# Patient Record
Sex: Male | Born: 1945 | Race: Black or African American | Hispanic: No | Marital: Single | Smoking: Former smoker
Health system: Southern US, Community
[De-identification: ages and names within clinical notes are randomized; demographics above are authoritative.]

## PROBLEM LIST (undated history)

## (undated) DIAGNOSIS — K219 Gastro-esophageal reflux disease without esophagitis: Secondary | ICD-10-CM

## (undated) DIAGNOSIS — R7303 Prediabetes: Secondary | ICD-10-CM

## (undated) DIAGNOSIS — C801 Malignant (primary) neoplasm, unspecified: Secondary | ICD-10-CM

## (undated) HISTORY — PX: CERVICAL FUSION: SHX112

---

## 2006-07-10 ENCOUNTER — Ambulatory Visit: Payer: Self-pay

## 2008-07-19 ENCOUNTER — Emergency Department: Payer: Self-pay | Admitting: Emergency Medicine

## 2008-12-17 ENCOUNTER — Emergency Department: Payer: Self-pay | Admitting: Emergency Medicine

## 2009-05-11 ENCOUNTER — Observation Stay: Payer: Self-pay | Admitting: Specialist

## 2009-05-16 ENCOUNTER — Emergency Department: Payer: Self-pay | Admitting: Emergency Medicine

## 2009-05-26 ENCOUNTER — Ambulatory Visit: Payer: Self-pay | Admitting: Family Medicine

## 2009-05-28 ENCOUNTER — Ambulatory Visit: Payer: Self-pay | Admitting: Internal Medicine

## 2010-07-22 ENCOUNTER — Emergency Department: Payer: Self-pay | Admitting: Emergency Medicine

## 2010-12-05 ENCOUNTER — Emergency Department: Payer: Self-pay | Admitting: Emergency Medicine

## 2011-03-25 ENCOUNTER — Emergency Department: Payer: Self-pay | Admitting: Emergency Medicine

## 2012-01-07 ENCOUNTER — Emergency Department: Payer: Self-pay | Admitting: Emergency Medicine

## 2012-04-16 ENCOUNTER — Emergency Department: Payer: Self-pay | Admitting: Unknown Physician Specialty

## 2012-04-16 LAB — CBC
HCT: 34.5 % — ABNORMAL LOW (ref 40.0–52.0)
MCHC: 33.8 g/dL (ref 32.0–36.0)
Platelet: 210 10*3/uL (ref 150–440)
RBC: 3.74 10*6/uL — ABNORMAL LOW (ref 4.40–5.90)
RDW: 12.7 % (ref 11.5–14.5)

## 2012-04-16 LAB — COMPREHENSIVE METABOLIC PANEL WITH GFR
Albumin: 3.6 g/dL
Alkaline Phosphatase: 75 U/L
Anion Gap: 6 — ABNORMAL LOW
BUN: 13 mg/dL
Bilirubin,Total: 0.3 mg/dL
Calcium, Total: 8.7 mg/dL
Chloride: 110 mmol/L — ABNORMAL HIGH
Co2: 28 mmol/L
Creatinine: 0.82 mg/dL
EGFR (African American): >60
EGFR (Non-African Amer.): >60
Glucose: 76 mg/dL
Osmolality: 286
Potassium: 3.3 mmol/L — ABNORMAL LOW
SGOT(AST): 33 U/L
SGPT (ALT): 39 U/L
Sodium: 144 mmol/L
Total Protein: 7.5 g/dL

## 2012-04-16 LAB — CK TOTAL AND CKMB (NOT AT ARMC)
CK, Total: 469 U/L — ABNORMAL HIGH
CK-MB: 10.7 ng/mL — ABNORMAL HIGH

## 2012-04-16 LAB — TROPONIN I: Troponin-I: <0.02 ng/mL

## 2012-04-17 LAB — CK TOTAL AND CKMB (NOT AT ARMC)
CK, Total: 387 U/L — ABNORMAL HIGH (ref 35–232)
CK-MB: 8.2 ng/mL — ABNORMAL HIGH (ref 0.5–3.6)

## 2012-04-17 LAB — MAGNESIUM: Magnesium: 2 mg/dL

## 2012-04-17 LAB — TROPONIN I: Troponin-I: <0.02 ng/mL

## 2012-04-17 LAB — PRO B NATRIURETIC PEPTIDE: B-Type Natriuretic Peptide: 240 pg/mL — ABNORMAL HIGH (ref 0–125)

## 2013-04-22 ENCOUNTER — Emergency Department: Payer: Self-pay | Admitting: Emergency Medicine

## 2013-04-22 LAB — BASIC METABOLIC PANEL
Anion Gap: 4 — ABNORMAL LOW (ref 7–16)
BUN: 9 mg/dL (ref 7–18)
CHLORIDE: 106 mmol/L (ref 98–107)
Calcium, Total: 9.2 mg/dL (ref 8.5–10.1)
Co2: 29 mmol/L (ref 21–32)
Creatinine: 0.87 mg/dL (ref 0.60–1.30)
EGFR (African American): >60
EGFR (Non-African Amer.): >60
Glucose: 75 mg/dL (ref 65–99)
OSMOLALITY: 275 (ref 275–301)
Potassium: 3.8 mmol/L (ref 3.5–5.1)
Sodium: 139 mmol/L (ref 136–145)

## 2013-04-22 LAB — CBC
HCT: 36.9 % — ABNORMAL LOW (ref 40.0–52.0)
HGB: 12.5 g/dL — AB (ref 13.0–18.0)
MCH: 31.6 pg (ref 26.0–34.0)
MCHC: 34 g/dL (ref 32.0–36.0)
MCV: 93 fL (ref 80–100)
Platelet: 178 10*3/uL (ref 150–440)
RBC: 3.97 10*6/uL — ABNORMAL LOW (ref 4.40–5.90)
RDW: 13.1 % (ref 11.5–14.5)
WBC: 7.5 10*3/uL (ref 3.8–10.6)

## 2013-04-22 LAB — TROPONIN I
Troponin-I: <0.02 ng/mL
Troponin-I: <0.02 ng/mL

## 2013-08-29 ENCOUNTER — Emergency Department: Payer: Self-pay | Admitting: Emergency Medicine

## 2013-08-29 LAB — BASIC METABOLIC PANEL
Anion Gap: 5 — ABNORMAL LOW (ref 7–16)
BUN: 10 mg/dL (ref 7–18)
CALCIUM: 8.7 mg/dL (ref 8.5–10.1)
CO2: 25 mmol/L (ref 21–32)
Chloride: 109 mmol/L — ABNORMAL HIGH (ref 98–107)
Creatinine: 0.94 mg/dL (ref 0.60–1.30)
EGFR (Non-African Amer.): >60
GLUCOSE: 93 mg/dL (ref 65–99)
Osmolality: 276 (ref 275–301)
Potassium: 4 mmol/L (ref 3.5–5.1)
SODIUM: 139 mmol/L (ref 136–145)

## 2013-08-29 LAB — URIC ACID: URIC ACID: 3.7 mg/dL (ref 3.5–7.2)

## 2014-04-28 ENCOUNTER — Emergency Department: Payer: Self-pay | Admitting: Emergency Medicine

## 2014-04-28 LAB — URINALYSIS, COMPLETE
BILIRUBIN, UR: NEGATIVE
Bacteria: NONE SEEN
Blood: NEGATIVE
GLUCOSE, UR: NEGATIVE mg/dL (ref 0–75)
Ketone: NEGATIVE
Leukocyte Esterase: NEGATIVE
Nitrite: NEGATIVE
Ph: 6 (ref 4.5–8.0)
Protein: NEGATIVE
Specific Gravity: 1.006 (ref 1.003–1.030)
WBC UR: <1 /HPF (ref 0–5)

## 2015-11-27 ENCOUNTER — Emergency Department
Admission: EM | Admit: 2015-11-27 | Discharge: 2015-11-27 | Disposition: A | Discharge status: Home / Self Care | Payer: Medicare Other | Attending: Emergency Medicine | Admitting: Emergency Medicine

## 2015-11-27 DIAGNOSIS — T63441A Toxic effect of venom of bees, accidental (unintentional), initial encounter: Secondary | ICD-10-CM | POA: Diagnosis not present

## 2015-11-27 DIAGNOSIS — Z79899 Other long term (current) drug therapy: Secondary | ICD-10-CM | POA: Diagnosis not present

## 2015-11-27 DIAGNOSIS — Z87891 Personal history of nicotine dependence: Secondary | ICD-10-CM | POA: Diagnosis not present

## 2015-11-27 HISTORY — DX: Gastro-esophageal reflux disease without esophagitis: K21.9

## 2015-11-27 HISTORY — DX: Prediabetes: R73.03

## 2015-11-27 MED ORDER — DIPHENHYDRAMINE HCL 25 MG PO CAPS
25.0000 mg | ORAL_CAPSULE | Freq: Once | ORAL | Status: AC
  Administered 2015-11-27: 25 mg via ORAL
  Filled 2015-11-27: qty 1

## 2015-11-27 MED ORDER — IBUPROFEN 800 MG PO TABS
800.0000 mg | ORAL_TABLET | Freq: Once | ORAL | Status: AC
  Administered 2015-11-27: 800 mg via ORAL
  Filled 2015-11-27: qty 1

## 2015-11-27 MED ORDER — IBUPROFEN 600 MG PO TABS: 600.0000 mg | ORAL_TABLET | Freq: Four times a day (QID) | ORAL | 0 refills | Status: DC | PRN

## 2015-11-27 MED ORDER — DIPHENHYDRAMINE HCL 25 MG PO CAPS: 25.0000 mg | ORAL_CAPSULE | ORAL | 0 refills | Status: DC | PRN

## 2015-11-27 NOTE — ED Provider Notes (Signed)
[facility] Provider Note   CSN: [id number] Arrival date & time: 11/27/15  1734     History   Chief Complaint Chief Complaint  Patient presents with  . Insect Bite    HPI Frank Buckley is a 70 y.o. male resistors for evaluation of bee stings. Patient states he was stung 6 times. Stings occurred along both hands, both legs, neck and abdomen. He denies any history of anaphylaxis reactions to bee or wasp stings. Incident occurred 90 minutes prior to arrival. He has not taken anything for pain and inflammation. He describes some itching. He denies any pain, shortness of breath, facial swelling or difficulty swallowing.  HPI  Past Medical History:  Diagnosis Date  . Borderline diabetic   . GERD (gastroesophageal reflux disease)     There are no active problems to display for this patient.   Past Surgical History:  Procedure Laterality Date  . CERVICAL FUSION         Home Medications    Prior to Admission medications   Medication Sig Start Date End Date Taking? Authorizing Provider  omeprazole (PRILOSEC) 20 MG capsule Take 20 mg by mouth daily.   Yes Historical Provider, MD  pravastatin (PRAVACHOL) 20 MG tablet Take 20 mg by mouth daily.   Yes Historical Provider, MD  diphenhydrAMINE (BENADRYL) 25 mg capsule Take 1 capsule (25 mg total) by mouth every 4 (four) hours as needed. 11/27/15 11/26/16  Duanne Guess, PA-C  ibuprofen (ADVIL,MOTRIN) 600 MG tablet Take 1 tablet (600 mg total) by mouth every 6 (six) hours as needed for moderate pain. 11/27/15   Duanne Guess, PA-C    Family History No family history on file.  Social History Social History  Substance Use Topics  . Smoking status: Former Research scientist (life sciences)  . Smokeless tobacco: Never Used  . Alcohol use No     Allergies   Penicillins   Review of Systems Review of Systems  Constitutional: Negative for chills and fever.  HENT: Negative for ear pain and sore throat.   Eyes: Negative for pain and  visual disturbance.  Respiratory: Negative for cough and shortness of breath.   Cardiovascular: Negative for chest pain and palpitations.  Gastrointestinal: Negative for abdominal pain and vomiting.  Genitourinary: Negative for dysuria and hematuria.  Musculoskeletal: Negative for arthralgias and back pain.  Skin: Positive for wound. Negative for color change and rash.  Neurological: Negative for seizures and syncope.  All other systems reviewed and are negative.    Physical Exam Updated Vital Signs BP 136/66 (BP Location: Right Arm)   Pulse 73   Temp 98.4 F (36.9 C) (Oral)   Resp 18   Ht 5\' 9"  (1.753 m)   Wt 81.6 kg   SpO2 98%   BMI 26.58 kg/m   Physical Exam  Constitutional: He appears well-developed and well-nourished.  HENT:  Head: Normocephalic and atraumatic.  Right Ear: External ear normal.  Left Ear: External ear normal.  Nose: Nose normal.  Mouth/Throat: Oropharynx is clear and moist. No oropharyngeal exudate.  No signs of angioedema.  Eyes: Conjunctivae are normal.  Neck: Neck supple.  Cardiovascular: Normal rate, regular rhythm and normal heart sounds.   No murmur heard. Pulmonary/Chest: Effort normal and breath sounds normal. No respiratory distress. He has no wheezes. He has no rales.  Abdominal: Soft. There is no tenderness.  Musculoskeletal: He exhibits no edema.  Neurological: He is alert.  Skin: Skin is warm and dry.  Small papular areas with erythema along  left and right hand, left and right lower legs in the abdomen. No signs of systemic swelling.  Psychiatric: He has a normal mood and affect. His behavior is normal. Judgment and thought content normal.  Nursing note and vitals reviewed.    ED Treatments / Results  Labs (all labs ordered are listed, but only abnormal results are displayed) Labs Reviewed - No data to display  EKG  EKG Interpretation None       Radiology No results found.  Procedures Procedures (including critical  care time)  Medications Ordered in ED Medications  diphenhydrAMINE (BENADRYL) capsule 25 mg (25 mg Oral Given 11/27/15 1838)  ibuprofen (ADVIL,MOTRIN) tablet 800 mg (800 mg Oral Given 11/27/15 1838)     Initial Impression / Assessment and Plan / ED Course  I have reviewed the triage vital signs and the nursing notes.  Pertinent labs & imaging results that were available during my care of the patient were reviewed by me and considered in my medical decision making (see chart for details).  Clinical Course    70 year old male with bee stings to the upper and lower extremities 6. No signs of systemic inflammation. Vital signs are normal. Exam unremarkable except for small localized areas of erythema. Patient given Benadryl and ibuprofen. He is stable at discharge. He is educated on signs and symptoms to return to the ED for.  Final Clinical Impressions(s) / ED Diagnoses   Final diagnoses:  Bee sting, accidental or unintentional, initial encounter    New Prescriptions New Prescriptions   DIPHENHYDRAMINE (BENADRYL) 25 MG CAPSULE    Take 1 capsule (25 mg total) by mouth every 4 (four) hours as needed.   IBUPROFEN (ADVIL,MOTRIN) 600 MG TABLET    Take 1 tablet (600 mg total) by mouth every 6 (six) hours as needed for moderate pain.     Duanne Guess, PA-C 11/27/15 1912    Harvest Dark, MD 11/27/15 2258

## 2015-11-27 NOTE — ED Triage Notes (Signed)
Pt states he was mowing and about 5 yellow jackets stung BL hands and LLE.Marland Kitchen Pt denies any rash or oral swelling.

## 2015-11-27 NOTE — ED Notes (Signed)
States he was weed eating and ran across a nest of bees  Had multiple stings and feels like the stingers are still there no resp distress noted

## 2016-06-06 ENCOUNTER — Encounter: Payer: Self-pay | Admitting: Emergency Medicine

## 2016-06-06 ENCOUNTER — Emergency Department: Payer: Medicare Other

## 2016-06-06 ENCOUNTER — Emergency Department
Admission: EM | Admit: 2016-06-06 | Discharge: 2016-06-06 | Disposition: A | Discharge status: Home / Self Care | Payer: Medicare Other | Attending: Emergency Medicine | Admitting: Emergency Medicine

## 2016-06-06 DIAGNOSIS — M79604 Pain in right leg: Secondary | ICD-10-CM | POA: Diagnosis not present

## 2016-06-06 DIAGNOSIS — N50811 Right testicular pain: Secondary | ICD-10-CM | POA: Diagnosis present

## 2016-06-06 DIAGNOSIS — Z87891 Personal history of nicotine dependence: Secondary | ICD-10-CM | POA: Diagnosis not present

## 2016-06-06 DIAGNOSIS — N433 Hydrocele, unspecified: Secondary | ICD-10-CM

## 2016-06-06 DIAGNOSIS — N5089 Other specified disorders of the male genital organs: Secondary | ICD-10-CM

## 2016-06-06 DIAGNOSIS — N503 Cyst of epididymis: Secondary | ICD-10-CM | POA: Diagnosis not present

## 2016-06-06 MED ORDER — ACETAMINOPHEN 325 MG PO TABS
650.0000 mg | ORAL_TABLET | Freq: Once | ORAL | Status: AC
  Administered 2016-06-06: 650 mg via ORAL
  Filled 2016-06-06: qty 2

## 2016-06-06 NOTE — ED Provider Notes (Signed)
[facility] Emergency Department Provider Note  ____________________________________________  Time seen: Approximately 10:34 AM  I have reviewed the triage vital signs and the nursing notes.   HISTORY  Chief Complaint Shoulder Pain and Knee Pain    HPI ISAHA Buckley is a 71 y.o. male with a history of cervical fusion, chronic low back pain, presenting with right testicular pain and mass, as well as posterior right leg pain. Patient is a poor historian and has multiple symptoms. He reports that several weeks ago he had left shoulder pain, and right shoulder pain, and the symptoms have both completely resolved. Over the past 2 weeks, he has noticed pain in the posterior right thigh that is worse when he walks or if he tries to stand completely upright. It feels "tight." He has continued to be able to walk with a cane and has not sustained any falls or has any new trauma. He has not had any saddle anesthesia, urinary or fecal incontinence or retention, or fever. He has no associated numbness or tingling. In addition, he notes that for the last several months, he has a testicular pressure with possible mass in the right side which is only present if he is upright. No penile discharge or urinary symptoms including dysuria or frequency.   Past Medical History:  Diagnosis Date  . Borderline diabetic   . GERD (gastroesophageal reflux disease)     There are no active problems to display for this patient.   Past Surgical History:  Procedure Laterality Date  . CERVICAL FUSION      Current Outpatient Rx  . Order #: [id number] Class: Print  . Order #: [id number] Class: Print  . Order #: [id number] Class: Historical Med  . Order #: [id number] Class: Historical Med    Allergies Penicillins  No family history on file.  Social History Social History  Substance Use Topics  . Smoking status: Former Research scientist (life sciences)  . Smokeless tobacco: Never Used  . Alcohol use No    Review  of Systems Constitutional: No fever/chills.Lightheadedness or syncope. No falls. Eyes: No visual changes. ENT: No sore throat. No congestion or rhinorrhea. Cardiovascular: Denies chest pain. Denies palpitations. Respiratory: Denies shortness of breath.  No cough. Gastrointestinal: No abdominal pain.  No nausea, no vomiting.  No diarrhea.  No constipation. Genitourinary: Negative for dysuria. Musculoskeletal: Positive for low back pain. Positive for right posterior upper leg pain. Bilateral shoulder pain, now resolved. Skin: Negative for rash. Neurological: Negative for headaches. No focal numbness, tingling or weakness. As of difficulty walking due to right posterior leg pain but no ataxia.  10-point ROS otherwise negative.  ____________________________________________   PHYSICAL EXAM:  VITAL SIGNS: ED Triage Vitals  Enc Vitals Group     BP 06/06/16 0959 138/66     Pulse Rate 06/06/16 0959 76     Resp 06/06/16 0959 18     Temp 06/06/16 0959 98.3 F (36.8 C)     Temp Source 06/06/16 0959 Oral     SpO2 06/06/16 0959 98 %     Weight 06/06/16 1000 180 lb (81.6 kg)     Height 06/06/16 1000 5\' 10"  (1.778 m)     Head Circumference --      Peak Flow --      Pain Score 06/06/16 1004 6     Pain Loc --      Pain Edu? --      Excl. in [place]? --     Constitutional: Patient is alert and answering questions appropriately.  He is chronically ill appearing but nontoxic. Eyes: Conjunctivae are normal.  EOMI. No scleral icterus. Head: Atraumatic. Nose: No congestion/rhinnorhea. Mouth/Throat: Mucous membranes are moist.  Neck: No stridor.  Supple.  Mild kyphosis of the neck without any midline C-spine tenderness to palpation, step-offs or deformities. Cardiovascular: Normal rate, regular rhythm. No murmurs, rubs or gallops.  Respiratory: Normal respiratory effort.  No accessory muscle use or retractions. Lungs CTAB.  No wheezes, rales or ronchi. Gastrointestinal: Soft, nontender and  nondistended.  No guarding or rebound.  No peritoneal signs. Genitourinary: Normal-appearing penis without lesions or discharge. Normal testicular lie. No tenderness to palpation in the left testicle. Mild tenderness to palpation in the posterior right testicle. No palpable masses. No obvious inguinal hernias. Musculoskeletal: No LE edema. No midline T or L-spine tenderness to palpation. Full range of motion of the right ankle, knee and hip without pain. With walking, however, the patient remains in a kyphotic position and his right hamstring pain worsens when he becomes more upright. Neurologic:  Alert.  Speech is clear.  Face and smile are symmetric.  EOMI.  Moves all extremities well. Skin:  Skin is warm, dry and intact. No rash noted. Psychiatric: Mood and affect are normal.   ____________________________________________   LABS (all labs ordered are listed, but only abnormal results are displayed)  Labs Reviewed - No data to display ____________________________________________  EKG  Not indicated ____________________________________________  RADIOLOGY  US Scrotum  Result Date: 06/06/2016 CLINICAL DATA:  Right-sided testicular pain and swelling for 2 weeks EXAM: SCROTAL ULTRASOUND DOPPLER ULTRASOUND OF THE TESTICLES TECHNIQUE: Complete ultrasound examination of the testicles, epididymis, and other scrotal structures was performed. Color and spectral Doppler ultrasound were also utilized to evaluate blood flow to the testicles. COMPARISON:  None. FINDINGS: Right testicle Measurements: 4.2 x 2.2 x 3.0 cm. No mass or microlithiasis visualized. Left testicle Measurements: 4.3 x 2.1 x 2.4 cm. No mass or microlithiasis visualized. Right epididymis:  Normal in size and appearance. Left epididymis:  Few scattered epididymal cysts are noted. Hydrocele:  Bilateral hydroceles are seen. Varicocele:  None visualized. Pulsed Doppler interrogation of both testes demonstrates normal low resistance arterial  and venous waveforms bilaterally. IMPRESSION: Bilateral hydroceles. Left epididymal cysts. Normal-appearing testicles bilaterally. Electronically Signed   By: Inez Catalina M.D.   On: 06/06/2016 11:42   [place] Art/ven Flow Abd Pelv Doppler  Result Date: 06/06/2016 CLINICAL DATA:  Right-sided testicular pain and swelling for 2 weeks EXAM: SCROTAL ULTRASOUND DOPPLER ULTRASOUND OF THE TESTICLES TECHNIQUE: Complete ultrasound examination of the testicles, epididymis, and other scrotal structures was performed. Color and spectral Doppler ultrasound were also utilized to evaluate blood flow to the testicles. COMPARISON:  None. FINDINGS: Right testicle Measurements: 4.2 x 2.2 x 3.0 cm. No mass or microlithiasis visualized. Left testicle Measurements: 4.3 x 2.1 x 2.4 cm. No mass or microlithiasis visualized. Right epididymis:  Normal in size and appearance. Left epididymis:  Few scattered epididymal cysts are noted. Hydrocele:  Bilateral hydroceles are seen. Varicocele:  None visualized. Pulsed Doppler interrogation of both testes demonstrates normal low resistance arterial and venous waveforms bilaterally. IMPRESSION: Bilateral hydroceles. Left epididymal cysts. Normal-appearing testicles bilaterally. Electronically Signed   By: Inez Catalina M.D.   On: 06/06/2016 11:42    ____________________________________________   PROCEDURES  Procedure(s) performed: None  Procedures  Critical Care performed: No ____________________________________________   INITIAL IMPRESSION / ASSESSMENT AND PLAN / ED COURSE  Pertinent labs & imaging results that were available during my care of the patient were  reviewed by me and considered in my medical decision making (see chart for details).  71 y.o. male with multiple symptoms, including right testicular pain, right posterior thigh pain, now resolved bilateral shoulder pain. I'll get an ultrasound of his testicle to evaluate for mass, cystocele or much less likely torsion;  inguinal hernia is also possible. For the patient's leg pain, is possible that he has tightening of the hamstring, although a sciatic type picture is also possible given that his pain is worse when he stands straight. I having coverage him to alternate Tylenol and Motrin for this pain. We have also discussed the use of heat therapy on his low back, follow-up with the primary care physician for possible physical therapy or additional imaging. At this time, the patient is not having any shoulder pain, so this chief complaint does not need to be addressed.  Plan re-evaluation for final disposition.  ----------------------------------------- 12:24 PM on 06/06/2016 -----------------------------------------  The patient's ultrasound shows bilateral hydroceles the left epididymal cyst. These are not the cause of his pain, and I'll have him treat his pain with Tylenol and Motrin and have him follow up with his primary care physician. Plan discharge at this time.  ____________________________________________  FINAL CLINICAL IMPRESSION(S) / ED DIAGNOSES  Final diagnoses:  Bilateral hydrocele  Epididymal cyst  Right testicular pain  Right leg pain         NEW MEDICATIONS STARTED DURING THIS VISIT:  New Prescriptions   No medications on file      Eula Listen, MD 06/06/16 1225

## 2016-06-06 NOTE — ED Triage Notes (Signed)
C/O left shoulder pain when raising left arm for several weeks.  Alsp c/o similar pain to right shoulder and right knee pain.

## 2016-06-06 NOTE — Discharge Instructions (Signed)
Please alternate Tylenol and Motrin for your leg pain and your testicle pain. Please make an appointment to see your primary care physician for reevaluation of your pain.  Return to the emergency department if you develop severe pain, inability to walk, numbness tingling or weakness, or any other symptoms concerning to you.

## 2017-11-03 NOTE — Progress Notes (Signed)
11/06/2017 2:27 PM   [name] 1945/12/09 [id number]  Referring provider: Frazier Richards, [address] [place], [place] [address]  Chief Complaint  Patient presents with  . Hematuria    New Patient    HPI: Patient is a 72 -year-old [place] American male who presents today as a referral from Dr. Frazier Richards for gross hematuria.      He has been noticing blood at the end of his urine stream on two separate occassions two days apart.  Urine culture was negative.      He does not have a prior history of recurrent urinary tract infections, nephrolithiasis, trauma to the genitourinary tract, BPH or malignancies of the genitourinary tract.   He does not have a family medical history of nephrolithiasis, malignancies of the genitourinary tract or hematuria.   Today, he is having symptoms of urgency, dysuria, nocturia, incontinence, intermittency or a weak urinary stream.  Patient denies any gross hematuria, dysuria or suprapubic/flank pain.  Patient denies any fevers, chills, nausea or vomiting.   His UA today is negative.    He is a former smoker.  He smoked for 30 years.  Quit 20 years ago.  He is no exposed to secondhand smoke.  He worked in [employer].    He states he has lost 30 lbs since Christmas.    PMH: Past Medical History:  Diagnosis Date  . Borderline diabetic   . GERD (gastroesophageal reflux disease)     Surgical History: Past Surgical History:  Procedure Laterality Date  . CERVICAL FUSION      Home Medications:  Allergies as of 11/06/2017      Reactions   Penicillins Rash      Medication List        Accurate as of 11/06/17  2:27 PM. Always use your most recent med list.          diphenhydrAMINE 25 mg capsule Commonly known as:  BENADRYL Take 1 capsule (25 mg total) by mouth every 4 (four) hours as needed.   ibuprofen 600 MG tablet Commonly known as:  ADVIL,MOTRIN Take 1 tablet (600 mg total) by mouth every 6 (six) hours as needed for  moderate pain.   omeprazole 20 MG capsule Commonly known as:  PRILOSEC Take 20 mg by mouth daily.   pravastatin 20 MG tablet Commonly known as:  PRAVACHOL Take 20 mg by mouth daily.       Allergies:  Allergies  Allergen Reactions  . Penicillins Rash    Family History: History reviewed. No pertinent family history.  Social History:  reports that he has quit smoking. He has never used smokeless tobacco. He reports that he does not drink alcohol. His drug history is not on file.  ROS: UROLOGY Frequent Urination?: Yes Hard to postpone urination?: Yes Burning/pain with urination?: Yes Get up at night to urinate?: Yes Leakage of urine?: No Urine stream starts and stops?: Yes Trouble starting stream?: No Do you have to strain to urinate?: Yes Blood in urine?: No Urinary tract infection?: No Sexually transmitted disease?: No Injury to kidneys or bladder?: No Painful intercourse?: No Weak stream?: No Erection problems?: Yes Penile pain?: No  Gastrointestinal Nausea?: No Vomiting?: No Indigestion/heartburn?: Yes Diarrhea?: No Constipation?: Yes  Constitutional Fever: No Night sweats?: No Weight loss?: Yes Fatigue?: Yes  Skin Skin rash/lesions?: No Itching?: Yes  Eyes Blurred vision?: Yes Double vision?: No  Ears/Nose/Throat Sore throat?: Yes Sinus problems?: Yes  Hematologic/Lymphatic Swollen glands?: No  Easy bruising?: No  Cardiovascular Leg swelling?: Yes Chest pain?: No  Respiratory Cough?: No Shortness of breath?: Yes  Endocrine Excessive thirst?: No  Musculoskeletal Back pain?: Yes Joint pain?: No  Neurological Headaches?: No Dizziness?: Yes  Psychologic Depression?: No Anxiety?: Yes  Physical Exam: BP (!) 104/59   Pulse 62   Ht 5\' 9"  (1.753 m)   Wt 180 lb (81.6 kg)   BMI 26.58 kg/m   Constitutional:   Alert and oriented, No acute distress. Disheveled.   HEENT: [place] AT, moist mucus membranes.  Trachea midline, no  masses. Cardiovascular: No clubbing, cyanosis, or edema. Respiratory: Normal respiratory effort, no increased work of breathing. GI: Abdomen is soft, non tender, non distended, no abdominal masses. Liver and spleen not palpable.  No hernias appreciated.  Stool sample for occult testing is not indicated.   GU: No CVA tenderness.  No bladder fullness or masses.  Patient with circumcised phallus.    Urethral meatus is patent.  No penile discharge. No penile lesions or rashes. Scrotum without lesions, cysts, rashes and/or edema.  Testicles are located scrotally bilaterally. No masses are appreciated in the testicles. Left and right epididymis are normal. Rectal: Patient with  normal sphincter tone. Anus and perineum without scarring or rashes. No rectal masses are appreciated. Prostate is approximately 60 grams, could only palpate the apex and midportion of gland, no nodules are appreciated. Seminal vesicles are normal. Skin: No rashes, bruises or suspicious lesions. Lymph: No cervical or inguinal adenopathy. Neurologic: Grossly intact, no focal deficits, moving all 4 extremities.  ? Right foot drop Psychiatric: Normal mood and affect.  Laboratory Data: Lab Results  Component Value Date   WBC 7.5 04/22/2013   HGB 12.5 (L) 04/22/2013   HCT 36.9 (L) 04/22/2013   MCV 93 04/22/2013   PLT 178 04/22/2013    Lab Results  Component Value Date   CREATININE 0.94 08/29/2013    No results found for: PSA  No results found for: TESTOSTERONE  No results found for: HGBA1C  No results found for: TSH  No results found for: CHOL, HDL, CHOLHDL, VLDL, LDLCALC  Lab Results  Component Value Date   AST 33 04/16/2012   Lab Results  Component Value Date   ALT 39 04/16/2012   No components found for: ALKALINEPHOPHATASE No components found for: BILIRUBINTOTAL  No results found for: ESTRADIOL   Urinalysis See HPI and see Epic.  I have reviewed the labs.   Assessment & Plan:   1. Gross  hematuria Explained to the patient that there are a number of causes that can be associated with blood in the urine, such as stones, BPH, UTI's, damage to the urinary tract and/or cancer. At this time, I felt that the patient warranted further urologic evaluation.   The AUA guidelines state that a CT urogram is the preferred imaging study to evaluate hematuria. I explained to the patient that a contrast material will be injected into a vein and that in rare instances, an allergic reaction can result and may even life threatening   The patient denies any allergies to contrast, iodine and/or seafood and is not taking metformin. Following the imaging study,  I've recommended a cystoscopy. I described how this is performed, typically in an office setting with a flexible cystoscope. We described the risks, benefits, and possible side effects, the most common of which is a minor amount of blood in the urine and/or burning which usually resolves in 24 to 48 hours.  The patient had  the opportunity to ask questions which were answered. Based upon this discussion, the patient is willing to proceed. Therefore, I've ordered: a CT Urogram and cystoscopy.  The patient will return following all of the above for discussion of the results.  UA was negative  Urine culture pending  BUN + creatinine pending    Return for CT Urogram report and cystoscopy.  These notes generated with voice recognition software. I apologize for typographical errors.  Zara Council, PA-C  [facility] [address] [place]  [place], [place] [address] [phone]

## 2017-11-06 ENCOUNTER — Ambulatory Visit (INDEPENDENT_AMBULATORY_CARE_PROVIDER_SITE_OTHER): Payer: Medicare HMO | Admitting: Urology

## 2017-11-06 ENCOUNTER — Other Ambulatory Visit: Payer: Self-pay

## 2017-11-06 ENCOUNTER — Encounter: Payer: Self-pay | Admitting: Urology

## 2017-11-06 VITALS — BP 104/59 | HR 62 | Ht 69.0 in | Wt 180.0 lb

## 2017-11-06 DIAGNOSIS — R310 Gross hematuria: Secondary | ICD-10-CM | POA: Diagnosis not present

## 2017-11-06 LAB — URINALYSIS, COMPLETE
BILIRUBIN UA: NEGATIVE
GLUCOSE, UA: NEGATIVE
Ketones, UA: NEGATIVE
LEUKOCYTES UA: NEGATIVE
NITRITE UA: NEGATIVE
Specific Gravity, UA: 1.02 (ref 1.005–1.030)
Urobilinogen, Ur: 4 mg/dL — ABNORMAL HIGH (ref 0.2–1.0)
pH, UA: 8.5 — ABNORMAL HIGH (ref 5.0–7.5)

## 2017-11-06 LAB — MICROSCOPIC EXAMINATION

## 2017-11-06 NOTE — Patient Instructions (Signed)
Hematuria, Adult Hematuria is blood in your urine. It can be caused by a bladder infection, kidney infection, prostate infection, kidney stone, or cancer of your urinary tract. Infections can usually be treated with medicine, and a kidney stone usually will pass through your urine. If neither of these is the cause of your hematuria, further workup to find out the reason may be needed. It is very important that you tell your health care provider about any blood you see in your urine, even if the blood stops without treatment or happens without causing pain. Blood in your urine that happens and then stops and then happens again can be a symptom of a very serious condition. Also, pain is not a symptom in the initial stages of many urinary cancers. Follow these instructions at home:  Drink lots of fluid, 3-4 quarts a day. If you have been diagnosed with an infection, cranberry juice is especially recommended, in addition to large amounts of water.  Avoid caffeine, tea, and carbonated beverages because they tend to irritate the bladder.  Avoid alcohol because it may irritate the prostate.  Take all medicines as directed by your health care provider.  If you were prescribed an antibiotic medicine, finish it all even if you start to feel better.  If you have been diagnosed with a kidney stone, follow your health care provider's instructions regarding straining your urine to catch the stone.  Empty your bladder often. Avoid holding urine for long periods of time.  After a bowel movement, women should cleanse front to back. Use each tissue only once.  Empty your bladder before and after sexual intercourse if you are a male. Contact a health care provider if:  You develop back pain.  You have a fever.  You have a feeling of sickness in your stomach (nausea) or vomiting.  Your symptoms are not better in 3 days. Return sooner if you are getting worse. Get help right away if:  You develop  severe vomiting and are unable to keep the medicine down.  You develop severe back or abdominal pain despite taking your medicines.  You begin passing a large amount of blood or clots in your urine.  You feel extremely weak or faint, or you pass out. This information is not intended to replace advice given to you by your health care provider. Make sure you discuss any questions you have with your health care provider. Document Released: 03/21/2005 Document Revised: 08/27/2015 Document Reviewed: 11/19/2012 Elsevier Interactive Patient Education  2017 [employer]  CT Scan A CT scan (computed tomography scan) is an imaging scan. It uses X-rays and a computer to make detailed pictures of different areas inside the body. A CT scan can give more information than a regular X-ray exam. A CT scan provides data about internal organs, soft tissue structures, blood vessels, and bones. In this procedure, the pictures will be taken in a large machine that has an opening (CT scanner). Tell a health care provider about:  Any allergies you have.  All medicines you are taking, including vitamins, herbs, eye drops, creams, and over-the-counter medicines.  Any blood disorders you have.  Any surgeries you have had.  Any medical conditions you have.  Whether you are pregnant or may be pregnant. What are the risks? Generally, this is a safe procedure. However, problems may occur, including:  An allergic reaction to dyes.  Development of cancer from excessive exposure to radiation from multiple CT scans. This is rare.  What happens before   the procedure? Staying hydrated Follow instructions from your health care provider about hydration, which may include:  Up to 2 hours before the procedure - you may continue to drink clear liquids, such as water, clear fruit juice, black coffee, and plain tea.  Eating and drinking restrictions Follow instructions from your health care provider about eating and  drinking, which may include:  24 hours before the procedure - stop drinking caffeinated beverages, such as energy drinks, tea, soda, coffee, and hot chocolate.  8 hours before the procedure - stop eating heavy meals or foods such as meat, fried foods, or fatty foods.  6 hours before the procedure - stop eating light meals or foods, such as toast or cereal.  6 hours before the procedure - stop drinking milk or drinks that contain milk.  2 hours before the procedure - stop drinking clear liquids.  General instructions  Remove any jewelry.  Ask your health care provider about changing or stopping your regular medicines. This is especially important if you are taking diabetes medicines or blood thinners. What happens during the procedure?  You will lie on a table with your arms above your head.  An IV tube may be inserted into one of your veins.  The contrast dye may be injected into the IV tube. You may feel warm or have a metallic taste in your mouth.  The table you will be lying on will move into the CT scanner.  You will be able to see, hear, and talk to the person running the machine while you are in it. Follow that person's instructions.  The CT scanner will move around you to take pictures. Do not move while it is scanning. Staying still helps the scanner to get a good image.  When the best possible pictures have been taken, the machine will be turned off. The table will be moved out of the machine.  The IV tube will be removed. The procedure may vary among health care providers and hospitals. What happens after the procedure?  It is up to you to get the results of your procedure. Ask your health care provider, or the department that is doing the procedure, when your results will be ready. Summary  A CT scan is an imaging scan.  A CT scan uses X-rays and a computer to make detailed pictures of different areas of your body.  Follow instructions from your health care  provider about eating and drinking before the procedure.  You will be able to see, hear, and talk to the person running the machine while you are in it. Follow that person's instructions. This information is not intended to replace advice given to you by your health care provider. Make sure you discuss any questions you have with your health care provider. Document Released: 04/28/2004 Document Revised: 04/23/2016 Document Reviewed: 04/23/2016 Elsevier Interactive Patient Education  2018 [employer]  Cystoscopy Cystoscopy is a procedure that is used to help diagnose and sometimes treat conditions that affect that lower urinary tract. The lower urinary tract includes the bladder and the tube that drains urine from the bladder out of the body (urethra). Cystoscopy is performed with a thin, tube-shaped instrument with a light and camera at the end (cystoscope). The cystoscope may be hard (rigid) or flexible, depending on the goal of the procedure.The cystoscope is inserted through the urethra, into the bladder. Cystoscopy may be recommended if you have:  Urinary tractinfections that keep coming back (recurring).  Blood in the urine (hematuria).    Loss of bladder control (urinary incontinence) or an overactive bladder.  Unusual cells found in a urine sample.  A blockage in the urethra.  Painful urination.  An abnormality in the bladder found during an intravenous pyelogram (IVP) or CT scan.  Cystoscopy may also be done to remove a sample of tissue to be examined under a microscope (biopsy). Tell a health care provider about:  Any allergies you have.  All medicines you are taking, including vitamins, herbs, eye drops, creams, and over-the-counter medicines.  Any problems you or family members have had with anesthetic medicines.  Any blood disorders you have.  Any surgeries you have had.  Any medical conditions you have.  Whether you are pregnant or may be pregnant. What are the  risks? Generally, this is a safe procedure. However, problems may occur, including:  Infection.  Bleeding.  Allergic reactions to medicines.  Damage to other structures or organs.  What happens before the procedure?  Ask your health care provider about: ? Changing or stopping your regular medicines. This is especially important if you are taking diabetes medicines or blood thinners. ? Taking medicines such as aspirin and ibuprofen. These medicines can thin your blood. Do not take these medicines before your procedure if your health care provider instructs you not to.  Follow instructions from your health care provider about eating or drinking restrictions.  You may be given antibiotic medicine to help prevent infection.  You may have an exam or testing, such as X-rays of the bladder, urethra, or kidneys.  You may have urine tests to check for signs of infection.  Plan to have someone take you home after the procedure. What happens during the procedure?  To reduce your risk of infection,your health care team will wash or sanitize their hands.  You will be given one or more of the following: ? A medicine to help you relax (sedative). ? A medicine to numb the area (local anesthetic).  The area around the opening of your urethra will be cleaned.  The cystoscope will be passed through your urethra into your bladder.  Germ-free (sterile)fluid will flow through the cystoscope to fill your bladder. The fluid will stretch your bladder so that your surgeon can clearly examine your bladder walls.  The cystoscope will be removed and your bladder will be emptied. The procedure may vary among health care providers and hospitals. What happens after the procedure?  You may have some soreness or pain in your abdomen and urethra. Medicines will be available to help you.  You may have some blood in your urine.  Do not drive for 24 hours if you received a sedative. This information is  not intended to replace advice given to you by your health care provider. Make sure you discuss any questions you have with your health care provider. Document Released: 03/18/2000 Document Revised: 07/30/2015 Document Reviewed: 02/05/2015 Elsevier Interactive Patient Education  2018 [employer] Inc.  

## 2017-11-08 ENCOUNTER — Telehealth: Payer: Self-pay

## 2017-11-08 LAB — CULTURE, URINE COMPREHENSIVE

## 2017-11-08 NOTE — Telephone Encounter (Signed)
-----   Message from Nori Riis, PA-C sent at 11/07/2017  6:59 PM EDT ----- Would you please have [name] give another UA so we can find out what type of cellular casts he has in the urine.

## 2017-11-08 NOTE — Telephone Encounter (Signed)
Pt vm is full

## 2017-11-09 NOTE — Telephone Encounter (Signed)
Pt vm full

## 2017-12-15 ENCOUNTER — Other Ambulatory Visit: Payer: Self-pay | Admitting: Urology

## 2017-12-15 DIAGNOSIS — R310 Gross hematuria: Secondary | ICD-10-CM

## 2017-12-19 ENCOUNTER — Other Ambulatory Visit
Admission: RE | Admit: 2017-12-19 | Discharge: 2017-12-19 | Disposition: A | Discharge status: Home / Self Care | Payer: Medicare HMO | Source: Ambulatory Visit | Attending: Urology | Admitting: Urology

## 2017-12-19 ENCOUNTER — Ambulatory Visit
Admission: RE | Admit: 2017-12-19 | Discharge: 2017-12-19 | Disposition: A | Discharge status: Home / Self Care | Payer: Medicare HMO | Source: Ambulatory Visit | Attending: Urology | Admitting: Urology

## 2017-12-19 DIAGNOSIS — D1803 Hemangioma of intra-abdominal structures: Secondary | ICD-10-CM | POA: Insufficient documentation

## 2017-12-19 DIAGNOSIS — I700 Atherosclerosis of aorta: Secondary | ICD-10-CM | POA: Insufficient documentation

## 2017-12-19 DIAGNOSIS — R310 Gross hematuria: Secondary | ICD-10-CM | POA: Diagnosis not present

## 2017-12-19 DIAGNOSIS — R935 Abnormal findings on diagnostic imaging of other abdominal regions, including retroperitoneum: Secondary | ICD-10-CM | POA: Insufficient documentation

## 2017-12-19 LAB — CREATININE, SERUM
CREATININE: 0.69 mg/dL (ref 0.61–1.24)
GFR calc non Af Amer: >60 mL/min (ref >60)

## 2017-12-19 LAB — BUN: BUN: 13 mg/dL (ref 8–23)

## 2017-12-19 MED ORDER — IOPAMIDOL (ISOVUE-300) INJECTION 61%
150.0000 mL | Freq: Once | INTRAVENOUS | Status: AC | PRN
  Administered 2017-12-19: 125 mL via INTRAVENOUS

## 2017-12-25 ENCOUNTER — Ambulatory Visit (INDEPENDENT_AMBULATORY_CARE_PROVIDER_SITE_OTHER): Payer: Medicare HMO | Admitting: Urology

## 2017-12-25 ENCOUNTER — Encounter: Payer: Self-pay | Admitting: Urology

## 2017-12-25 VITALS — BP 117/66 | HR 76 | Ht 69.0 in | Wt 169.8 lb

## 2017-12-25 DIAGNOSIS — R310 Gross hematuria: Secondary | ICD-10-CM | POA: Diagnosis not present

## 2017-12-25 LAB — MICROSCOPIC EXAMINATION
BACTERIA UA: NONE SEEN
RBC MICROSCOPIC, UA: NONE SEEN /HPF (ref 0–2)
WBC UA: NONE SEEN /HPF (ref 0–5)

## 2017-12-25 LAB — URINALYSIS, COMPLETE
Bilirubin, UA: NEGATIVE
Glucose, UA: NEGATIVE
Ketones, UA: NEGATIVE
Leukocytes, UA: NEGATIVE
NITRITE UA: NEGATIVE
Protein, UA: NEGATIVE
RBC UA: NEGATIVE
Specific Gravity, UA: 1.02 (ref 1.005–1.030)
UUROB: 4 mg/dL — AB (ref 0.2–1.0)
pH, UA: 8.5 — ABNORMAL HIGH (ref 5.0–7.5)

## 2017-12-25 MED ORDER — LIDOCAINE HCL URETHRAL/MUCOSAL 2 % EX GEL
1.0000 "application " | Freq: Once | CUTANEOUS | Status: AC
  Administered 2017-12-25: 1 via URETHRAL

## 2017-12-25 NOTE — Progress Notes (Signed)
   12/25/17  CC:  Chief Complaint  Patient presents with  . Cysto    HPI: 72 year old male previously seen by Zara Council for 2 isolated episodes of end stream hematuria.  CT urogram performed on 12/19/2017 showed no significant upper tract abnormalities.  There was a faint punctate cortical density which may represent a small calculus.  He was incidentally noted to have a heterogeneous osseous density throughout the pelvis and it was recommended he have clinical correlation for possible multiple myeloma.  Blood pressure 117/66, pulse 76, height '5\' 9"'$  (1.753 m), weight 169 lb 12.8 oz (77 kg). NED. A&Ox3.   No respiratory distress   Abd soft, NT, ND Normal phallus with bilateral descended testicles  Cystoscopy Procedure Note  Patient identification was confirmed, informed consent was obtained, and patient was prepped using Betadine solution.  Lidocaine jelly was administered per urethral meatus.    Preoperative abx where received prior to procedure.     Pre-Procedure: - Inspection reveals a normal caliber ureteral meatus.  Procedure: The flexible cystoscope was introduced without difficulty - No urethral strictures/lesions are present. 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  He did have osseous abnormalities of the pelvis and will refer to oncology for work-up of multiple myeloma.

## 2017-12-28 ENCOUNTER — Other Ambulatory Visit: Payer: Self-pay | Admitting: Urology

## 2017-12-29 NOTE — Progress Notes (Signed)
Urine cytology showed no evidence of malignant cells. 

## 2018-01-01 NOTE — Progress Notes (Signed)
Patient notified

## 2018-01-05 ENCOUNTER — Telehealth: Payer: Self-pay | Admitting: Urology

## 2018-01-05 NOTE — Telephone Encounter (Signed)
Contacted patient regarding osseous findings on his CT scan.  Radiology felt density could be related to multiple myeloma.  Would recommend an oncology evaluation.  Voicemail left for patient to call back.

## 2018-01-16 ENCOUNTER — Other Ambulatory Visit: Payer: Self-pay

## 2018-01-16 DIAGNOSIS — R9389 Abnormal findings on diagnostic imaging of other specified body structures: Secondary | ICD-10-CM

## 2018-01-16 NOTE — Telephone Encounter (Signed)
Pt presents in clinic for his results as he states he has had several messages left and not able to speak with anyone directly. Informed pt of information below. Pt gave verbal understanding. Referral placed.

## 2018-01-19 ENCOUNTER — Other Ambulatory Visit: Payer: Self-pay

## 2018-01-19 ENCOUNTER — Inpatient Hospital Stay: Payer: Medicare HMO

## 2018-01-19 ENCOUNTER — Encounter: Payer: Self-pay | Admitting: Oncology

## 2018-01-19 ENCOUNTER — Inpatient Hospital Stay: Payer: Medicare HMO | Attending: Oncology | Admitting: Oncology

## 2018-01-19 VITALS — BP 134/89 | HR 61 | Temp 97.0°F | Resp 20 | Wt 171.5 lb

## 2018-01-19 DIAGNOSIS — Z87891 Personal history of nicotine dependence: Secondary | ICD-10-CM | POA: Insufficient documentation

## 2018-01-19 DIAGNOSIS — Z79899 Other long term (current) drug therapy: Secondary | ICD-10-CM | POA: Diagnosis not present

## 2018-01-19 DIAGNOSIS — K219 Gastro-esophageal reflux disease without esophagitis: Secondary | ICD-10-CM

## 2018-01-19 DIAGNOSIS — R9389 Abnormal findings on diagnostic imaging of other specified body structures: Secondary | ICD-10-CM

## 2018-01-19 LAB — BASIC METABOLIC PANEL
ANION GAP: 8 (ref 5–15)
BUN: 11 mg/dL (ref 8–23)
CALCIUM: 8.7 mg/dL — AB (ref 8.9–10.3)
CHLORIDE: 101 mmol/L (ref 98–111)
CO2: 25 mmol/L (ref 22–32)
Creatinine, Ser: 0.72 mg/dL (ref 0.61–1.24)
GFR calc non Af Amer: >60 mL/min (ref >60)
Glucose, Bld: 143 mg/dL — ABNORMAL HIGH (ref 70–99)
Potassium: 3.7 mmol/L (ref 3.5–5.1)
Sodium: 134 mmol/L — ABNORMAL LOW (ref 135–145)

## 2018-01-19 LAB — CBC WITH DIFFERENTIAL/PLATELET
ABS IMMATURE GRANULOCYTES: 0.01 10*3/uL (ref 0.00–0.07)
Basophils Absolute: 0.1 10*3/uL (ref 0.0–0.1)
Basophils Relative: 1 %
Eosinophils Absolute: 0.5 10*3/uL (ref 0.0–0.5)
Eosinophils Relative: 9 %
HCT: 37.8 % — ABNORMAL LOW (ref 39.0–52.0)
HEMOGLOBIN: 12.4 g/dL — AB (ref 13.0–17.0)
Immature Granulocytes: 0 %
LYMPHS PCT: 34 %
Lymphs Abs: 1.9 10*3/uL (ref 0.7–4.0)
MCH: 30.8 pg (ref 26.0–34.0)
MCHC: 32.8 g/dL (ref 30.0–36.0)
MCV: 93.8 fL (ref 80.0–100.0)
MONO ABS: 0.6 10*3/uL (ref 0.1–1.0)
MONOS PCT: 10 %
NEUTROS ABS: 2.6 10*3/uL (ref 1.7–7.7)
Neutrophils Relative %: 46 %
Platelets: 220 10*3/uL (ref 150–400)
RBC: 4.03 MIL/uL — ABNORMAL LOW (ref 4.22–5.81)
RDW: 12.9 % (ref 11.5–15.5)
WBC: 5.5 10*3/uL (ref 4.0–10.5)
nRBC: 0 % (ref 0.0–0.2)

## 2018-01-19 NOTE — Progress Notes (Signed)
Patient here today for initial evaluation for abnormal ct scan.

## 2018-01-20 LAB — IGG, IGA, IGM
IGA: 100 mg/dL (ref 61–437)
IGG (IMMUNOGLOBIN G), SERUM: 1585 mg/dL (ref 700–1600)
IgM (Immunoglobulin M), Srm: 237 mg/dL — ABNORMAL HIGH (ref 15–143)

## 2018-01-20 LAB — PSA: Prostatic Specific Antigen: 0.63 ng/mL (ref 0.00–4.00)

## 2018-01-21 DIAGNOSIS — R9389 Abnormal findings on diagnostic imaging of other specified body structures: Secondary | ICD-10-CM | POA: Insufficient documentation

## 2018-01-21 NOTE — Progress Notes (Signed)
[facility]  [phone] [phone]  ID: Frank Buckley OB: 1946/03/23  MR#: [id number]  GUR#:[id number]  Patient Care Team: [facility], [facility] as PCP - General (General Practice)  CHIEF COMPLAINT: Abnormal CT scan  INTERVAL HISTORY: Patient is a 72 year old male who had a CT scan done for hematuria and was noted to have incidental heterogeneous osseous densities within the pelvis.  He currently feels well and is at his baseline.  He has gait problem secondary to an accident years ago.  He has no neurologic complaints.  He denies any recent fevers or illnesses.  He has a good appetite and denies weight loss.  He does not complain of pain today.  He has no chest pain or shortness of breath.  He denies any nausea, vomiting, constipation, or diarrhea.  He has no further hematuria or other urinary complaints.  Patient feels at his baseline offers no specific complaints today.  REVIEW OF SYSTEMS:   Review of Systems  Constitutional: Negative.  Negative for fever, malaise/fatigue and weight loss.  Respiratory: Negative.  Negative for cough, hemoptysis and shortness of breath.   Cardiovascular: Negative.  Negative for chest pain and leg swelling.  Gastrointestinal: Negative.  Negative for abdominal pain.  Genitourinary: Negative.  Negative for hematuria.  Musculoskeletal: Negative.  Negative for back pain and joint pain.  Skin: Negative.  Negative for rash.  Neurological: Negative.  Negative for focal weakness, weakness and headaches.  Psychiatric/Behavioral: Negative.  The patient is not nervous/anxious.     As per HPI. Otherwise, a complete review of systems is negative.  PAST MEDICAL HISTORY: Past Medical History:  Diagnosis Date  . Borderline diabetic   . GERD (gastroesophageal reflux disease)     PAST SURGICAL HISTORY: Past Surgical History:  Procedure Laterality Date  . CERVICAL FUSION      FAMILY HISTORY: History reviewed.  No pertinent family history.  ADVANCED DIRECTIVES (Y/N):  N  HEALTH MAINTENANCE: Social History   Tobacco Use  . Smoking status: Former Research scientist (life sciences)  . Smokeless tobacco: Never Used  Substance Use Topics  . Alcohol use: No  . Drug use: Never     Colonoscopy:  PAP:  Bone density:  Lipid panel:  Allergies  Allergen Reactions  . Penicillins Rash    Current Outpatient Medications  Medication Sig Dispense Refill  . ibuprofen (ADVIL,MOTRIN) 600 MG tablet Take 1 tablet (600 mg total) by mouth every 6 (six) hours as needed for moderate pain. 30 tablet 0  . omeprazole (PRILOSEC) 20 MG capsule Take 20 mg by mouth daily.    . pravastatin (PRAVACHOL) 20 MG tablet Take 20 mg by mouth daily.    . diphenhydrAMINE (BENADRYL) 25 mg capsule Take 1 capsule (25 mg total) by mouth every 4 (four) hours as needed. 30 capsule 0   No current facility-administered medications for this visit.     OBJECTIVE: Vitals:   01/19/18 1326  BP: 134/89  Pulse: 61  Resp: 20  Temp: (!) 97 F (36.1 C)     Body mass index is 25.33 kg/m.    ECOG FS:0 - Asymptomatic  General: Well-developed, well-nourished, no acute distress. Eyes: Pink conjunctiva, anicteric sclera. HEENT: Normocephalic, moist mucous membranes, clear oropharnyx. Lungs: Clear to auscultation bilaterally. Heart: Regular rate and rhythm. No rubs, murmurs, or gallops. Abdomen: Soft, nontender, nondistended. No organomegaly noted, normoactive bowel sounds. Musculoskeletal: No edema, cyanosis, or clubbing. Neuro: Alert, answering all questions appropriately. Cranial nerves grossly intact. Skin: No rashes or petechiae noted.  Psych: Normal affect. Lymphatics: No cervical, calvicular, axillary or inguinal LAD.   LAB RESULTS:  Lab Results  Component Value Date   NA 134 (L) 01/19/2018   K 3.7 01/19/2018   CL 101 01/19/2018   CO2 25 01/19/2018   GLUCOSE 143 (H) 01/19/2018   BUN 11 01/19/2018   CREATININE 0.72 01/19/2018   CALCIUM 8.7 (L)  01/19/2018   PROT 7.5 04/16/2012   ALBUMIN 3.6 04/16/2012   AST 33 04/16/2012   ALT 39 04/16/2012   ALKPHOS 75 04/16/2012   BILITOT 0.3 04/16/2012   GFRNONAA >60 01/19/2018   GFRAA >60 01/19/2018    Lab Results  Component Value Date   WBC 5.5 01/19/2018   NEUTROABS 2.6 01/19/2018   HGB 12.4 (L) 01/19/2018   HCT 37.8 (L) 01/19/2018   MCV 93.8 01/19/2018   PLT 220 01/19/2018     STUDIES: No results found.  ASSESSMENT: Abnormal CT scan.  PLAN:    1.  Abnormal CT scan: Imaging noted incidental heterogeneous osseous densities within the pelvis possibly consistent with underlying multiple myeloma.  Patient noted to have a mildly increased IgM level of 237, but SPEP as well as kappa and lambda light chains are pending at time of dictation.  He has no evidence of endorgan damage with a normal creatinine, calcium level, and CBC.  His PSA is within normal limits.  No intervention is needed at this time.  Patient will return to clinic in 2 weeks to discuss his laboratory work and any additional diagnostic testing necessary.  I spent a total of 45 minutes face-to-face with the patient of which greater than 50% of the visit was spent in counseling and coordination of care as detailed above.   Patient expressed understanding and was in agreement with this plan. He also understands that He can call clinic at any time with any questions, concerns, or complaints.    Lloyd Huger, MD   01/21/2018 9:03 AM

## 2018-01-22 LAB — PROTEIN ELECTRO, RANDOM URINE
ALBUMIN ELP UR: 28.1 %
ALPHA-1-GLOBULIN, U: 9.1 %
ALPHA-2-GLOBULIN, U: 20 %
Beta Globulin, U: 23.3 %
Gamma Globulin, U: 19.4 %
TOTAL PROTEIN, URINE-UPE24: 10 mg/dL

## 2018-01-22 LAB — PROTEIN ELECTROPHORESIS, SERUM
A/G Ratio: 1.1 (ref 0.7–1.7)
ALBUMIN ELP: 3.7 g/dL (ref 2.9–4.4)
ALPHA-2-GLOBULIN: 0.7 g/dL (ref 0.4–1.0)
Alpha-1-Globulin: 0.2 g/dL (ref 0.0–0.4)
BETA GLOBULIN: 1 g/dL (ref 0.7–1.3)
Gamma Globulin: 1.6 g/dL (ref 0.4–1.8)
Globulin, Total: 3.4 g/dL (ref 2.2–3.9)
M-Spike, %: 0.6 g/dL — ABNORMAL HIGH
Total Protein ELP: 7.1 g/dL (ref 6.0–8.5)

## 2018-01-22 LAB — KAPPA/LAMBDA LIGHT CHAINS
Kappa free light chain: 19.9 mg/L — ABNORMAL HIGH (ref 3.3–19.4)
Kappa, lambda light chain ratio: 1.86 — ABNORMAL HIGH (ref 0.26–1.65)
Lambda free light chains: 10.7 mg/L (ref 5.7–26.3)

## 2018-01-29 DIAGNOSIS — D472 Monoclonal gammopathy: Secondary | ICD-10-CM | POA: Insufficient documentation

## 2018-01-29 NOTE — Progress Notes (Addendum)
Frank Buckley  [phone] [phone]  ID: Frank Buckley OB: [birth year]  MR#: [id number]  JSR#:[id number]  Patient Care Team: Center, [facility] as PCP - General (General Practice)  CHIEF COMPLAINT: MGUS  INTERVAL HISTORY: Patient returns to clinic today for further evaluation and discussion of his laboratory work.  He continues to feel well and is asymptomatic.  He has difficulty walking secondary to an accident years ago.  He has no neurologic complaints.  He denies any recent fevers or illnesses.  He has a good appetite and denies weight loss.  He does not complain of pain today.  He has no chest pain or shortness of breath.  He denies any nausea, vomiting, constipation, or diarrhea.  He has no further hematuria or other urinary complaints.  Patient feels at his baseline offers no specific complaints today.  REVIEW OF SYSTEMS:   Review of Systems  Constitutional: Negative.  Negative for fever, malaise/fatigue and weight loss.  Respiratory: Negative.  Negative for cough, hemoptysis and shortness of breath.   Cardiovascular: Negative.  Negative for chest pain and leg swelling.  Gastrointestinal: Negative.  Negative for abdominal pain.  Genitourinary: Negative.  Negative for hematuria.  Musculoskeletal: Negative.  Negative for back pain and joint pain.  Skin: Negative.  Negative for rash.  Neurological: Negative.  Negative for focal weakness, weakness and headaches.  Psychiatric/Behavioral: Negative.  The patient is not nervous/anxious.     As per HPI. Otherwise, a complete review of systems is negative.  PAST MEDICAL HISTORY: Past Medical History:  Diagnosis Date  . Borderline diabetic   . GERD (gastroesophageal reflux disease)     PAST SURGICAL HISTORY: Past Surgical History:  Procedure Laterality Date  . CERVICAL FUSION      FAMILY HISTORY: History reviewed. No pertinent family history.  ADVANCED DIRECTIVES (Y/N):   N  HEALTH MAINTENANCE: Social History   Tobacco Use  . Smoking status: Former Research scientist (life sciences)  . Smokeless tobacco: Never Used  Substance Use Topics  . Alcohol use: No  . Drug use: Never     Colonoscopy:  PAP:  Bone density:  Lipid panel:  Allergies  Allergen Reactions  . Penicillins Rash    Current Outpatient Medications  Medication Sig Dispense Refill  . ibuprofen (ADVIL,MOTRIN) 600 MG tablet Take 1 tablet (600 mg total) by mouth every 6 (six) hours as needed for moderate pain. 30 tablet 0  . omeprazole (PRILOSEC) 20 MG capsule Take 20 mg by mouth daily.    . pravastatin (PRAVACHOL) 20 MG tablet Take 20 mg by mouth daily.    . diphenhydrAMINE (BENADRYL) 25 mg capsule Take 1 capsule (25 mg total) by mouth every 4 (four) hours as needed. 30 capsule 0   No current facility-administered medications for this visit.     OBJECTIVE: Vitals:   02/02/18 1007  BP: 125/77  Pulse: 66  Resp: 20  Temp: (!) 96.7 F (35.9 C)     Body mass index is 24.96 kg/m.    ECOG FS:0 - Asymptomatic  General: Well-developed, well-nourished, no acute distress. Eyes: Pink conjunctiva, anicteric sclera. HEENT: Normocephalic, moist mucous membranes. Lungs: Clear to auscultation bilaterally. Heart: Regular rate and rhythm. No rubs, murmurs, or gallops. Abdomen: Soft, nontender, nondistended. No organomegaly noted, normoactive bowel sounds. Musculoskeletal: No edema, cyanosis, or clubbing. Neuro: Alert, answering all questions appropriately. Cranial nerves grossly intact. Skin: No rashes or petechiae noted. Psych: Normal affect.   LAB RESULTS:  Lab Results  Component Value Date  NA 134 (L) 01/19/2018   K 3.7 01/19/2018   CL 101 01/19/2018   CO2 25 01/19/2018   GLUCOSE 143 (H) 01/19/2018   BUN 11 01/19/2018   CREATININE 0.72 01/19/2018   CALCIUM 8.7 (L) 01/19/2018   PROT 7.5 04/16/2012   ALBUMIN 3.6 04/16/2012   AST 33 04/16/2012   ALT 39 04/16/2012   ALKPHOS 75 04/16/2012   BILITOT 0.3  04/16/2012   GFRNONAA >60 01/19/2018   GFRAA >60 01/19/2018    Lab Results  Component Value Date   WBC 5.5 01/19/2018   NEUTROABS 2.6 01/19/2018   HGB 12.4 (L) 01/19/2018   HCT 37.8 (L) 01/19/2018   MCV 93.8 01/19/2018   PLT 220 01/19/2018   Lab Results  Component Value Date   TOTALPROTELP 7.1 01/19/2018   ALBUMINELP 3.7 01/19/2018   A1GS 0.2 01/19/2018   A2GS 0.7 01/19/2018   BETS 1.0 01/19/2018   GAMS 1.6 01/19/2018   MSPIKE 0.6 (H) 01/19/2018   SPEI Comment 01/19/2018     STUDIES: Dg Bone Survey Met  Result Date: 02/02/2018 CLINICAL DATA:  Follow-up CT scan.  Lucencies in the pelvis. EXAM: METASTATIC BONE SURVEY COMPARISON:  CT 02/02/2018.  Lumbar spine 10/24/2015. FINDINGS: Imaging of the axial and appendicular skeleton performed. Diffuse severe osteopenia noted. Multiple lucencies in the skull. Prior cervical spine fusion. Diffuse severe cervical, thoracic, and lumbar degenerative change. Thoracolumbar spine scoliosis. Lucencies again noted the pelvis, these are best identified by prior CT. Questionable lucencies in the proximal femurs bilaterally cortical thickening noted the right mid fibula. This may be from prior fracture and healing. Clinical correlation suggested. IMPRESSION: Lucencies noted within the skull, pelvis, and possibly the proximal femur. These findings are suspicious for multiple myeloma and or metastatic disease. Electronically Signed   By: Marcello Moores  Register   On: 02/02/2018 13:05    ASSESSMENT: MGUS.  PLAN:    1. MGUS: Imaging noted incidental heterogeneous osseous densities within the pelvis possibly consistent with underlying multiple myeloma.  Patient had metastatic bone survey on February 02, 2018 which revealed osseous lesions consistent with multiple myeloma.  He has a mild  M spike of 0.6 with a mild IgM predominance of 237.  His kappa/lambda free light chain ratio is only mildly elevated at 1.86.  He has no evidence of endorgan damage with a normal  creatinine, calcium level, and CBC.  His PSA is within normal limits.  Given the results of the metastatic bone survey, patient will require a bone marrow biopsy for further evaluation and confirmation of multiple myeloma.  Return to clinic in 1 week to discuss the biopsy and further evaluation.   I spent a total of 30 minutes face-to-face with the patient of which greater than 50% of the visit was spent in counseling and coordination of care as detailed above.   Patient expressed understanding and was in agreement with this plan. He also understands that He can call clinic at any time with any questions, concerns, or complaints.    Lloyd Huger, MD   02/04/2018 8:09 AM

## 2018-02-02 ENCOUNTER — Ambulatory Visit
Admission: RE | Admit: 2018-02-02 | Discharge: 2018-02-02 | Disposition: A | Discharge status: Home / Self Care | Payer: Medicare HMO | Source: Ambulatory Visit | Attending: Oncology | Admitting: Oncology

## 2018-02-02 ENCOUNTER — Encounter: Payer: Self-pay | Admitting: Oncology

## 2018-02-02 ENCOUNTER — Inpatient Hospital Stay: Payer: Medicare HMO | Attending: Oncology | Admitting: Oncology

## 2018-02-02 DIAGNOSIS — D472 Monoclonal gammopathy: Secondary | ICD-10-CM | POA: Insufficient documentation

## 2018-02-02 DIAGNOSIS — Z87891 Personal history of nicotine dependence: Secondary | ICD-10-CM | POA: Diagnosis not present

## 2018-02-02 DIAGNOSIS — R937 Abnormal findings on diagnostic imaging of other parts of musculoskeletal system: Secondary | ICD-10-CM | POA: Insufficient documentation

## 2018-02-02 DIAGNOSIS — Z79899 Other long term (current) drug therapy: Secondary | ICD-10-CM | POA: Insufficient documentation

## 2018-02-02 NOTE — Progress Notes (Signed)
Patient denies any concerns today.  

## 2018-02-05 ENCOUNTER — Telehealth: Payer: Self-pay | Admitting: Oncology

## 2018-02-05 ENCOUNTER — Telehealth: Payer: Self-pay | Admitting: *Deleted

## 2018-02-05 NOTE — Telephone Encounter (Signed)
See Dr. Grayland Ormond on 02/09/18  to discuss and plan for bone marrow biopsy, per Dr. Wyonia Hough msg.  L/M on V/M. Also s/w Brother/Alonzo Vickki Muff and gave appt info.

## 2018-02-05 NOTE — Telephone Encounter (Signed)
Left vm for pt regarding need to change appointments. Pt is scheduled for 11/8 to discuss need for bone marrow biopsy. If pt can be reached will attempt to get bone marrow biopsy scheduled and move f/u appt out by 1 week for results.

## 2018-02-05 NOTE — Telephone Encounter (Signed)
Spoke with patient's brother Magic Mohler and gave appt info for patient.

## 2018-02-09 ENCOUNTER — Inpatient Hospital Stay (HOSPITAL_BASED_OUTPATIENT_CLINIC_OR_DEPARTMENT_OTHER): Payer: Medicare HMO | Admitting: Oncology

## 2018-02-09 ENCOUNTER — Encounter: Payer: Self-pay | Admitting: Oncology

## 2018-02-09 VITALS — BP 137/76 | HR 68 | Temp 96.5°F | Resp 20

## 2018-02-09 DIAGNOSIS — D472 Monoclonal gammopathy: Secondary | ICD-10-CM

## 2018-02-09 NOTE — Progress Notes (Signed)
Patient here today for follow up, results of bone survey.

## 2018-02-11 NOTE — Progress Notes (Signed)
[place]  Telephone:(336) [phone] [phone]) [phone]  ID: Frank Buckley OB: 08/18/1945  MR#: [id number]  AJH#:[id number]  Patient Care Team: Center, [facility] as PCP - General (General Practice)  CHIEF COMPLAINT: MGUS, possible myeloma  INTERVAL HISTORY: Patient returns to clinic today to discuss his metastatic bone survey results and scheduling a bone marrow biopsy.  He continues to feel well and is at his baseline. He has difficulty walking secondary to an accident years ago.  He has no neurologic complaints.  He denies any recent fevers or illnesses.  He has a good appetite and denies weight loss.  He does not complain of pain today.  He has no chest pain or shortness of breath.  He denies any nausea, vomiting, constipation, or diarrhea.  He has no further hematuria or other urinary complaints.  Patient offers no specific complaints today.  REVIEW OF SYSTEMS:   Review of Systems  Constitutional: Negative.  Negative for fever, malaise/fatigue and weight loss.  Respiratory: Negative.  Negative for cough, hemoptysis and shortness of breath.   Cardiovascular: Negative.  Negative for chest pain and leg swelling.  Gastrointestinal: Negative.  Negative for abdominal pain.  Genitourinary: Negative.  Negative for hematuria.  Musculoskeletal: Negative.  Negative for back pain and joint pain.  Skin: Negative.  Negative for rash.  Neurological: Negative.  Negative for focal weakness, weakness and headaches.  Psychiatric/Behavioral: Negative.  The patient is not nervous/anxious.     As per HPI. Otherwise, a complete review of systems is negative.  PAST MEDICAL HISTORY: Past Medical History:  Diagnosis Date  . Borderline diabetic   . GERD (gastroesophageal reflux disease)     PAST SURGICAL HISTORY: Past Surgical History:  Procedure Laterality Date  . CERVICAL FUSION      FAMILY HISTORY: History reviewed. No pertinent family history.  ADVANCED  DIRECTIVES (Y/N):  N  HEALTH MAINTENANCE: Social History   Tobacco Use  . Smoking status: Former Research scientist (life sciences)  . Smokeless tobacco: Never Used  Substance Use Topics  . Alcohol use: No  . Drug use: Never     Colonoscopy:  PAP:  Bone density:  Lipid panel:  Allergies  Allergen Reactions  . Penicillins Rash    Current Outpatient Medications  Medication Sig Dispense Refill  . ibuprofen (ADVIL,MOTRIN) 600 MG tablet Take 1 tablet (600 mg total) by mouth every 6 (six) hours as needed for moderate pain. 30 tablet 0  . omeprazole (PRILOSEC) 20 MG capsule Take 20 mg by mouth daily.    . pravastatin (PRAVACHOL) 20 MG tablet Take 20 mg by mouth daily.    . diphenhydrAMINE (BENADRYL) 25 mg capsule Take 1 capsule (25 mg total) by mouth every 4 (four) hours as needed. 30 capsule 0   No current facility-administered medications for this visit.     OBJECTIVE: Vitals:   02/09/18 0940  BP: 137/76  Pulse: 68  Resp: 20  Temp: (!) 96.5 F (35.8 C)     There is no height or weight on file to calculate BMI.    ECOG FS:0 - Asymptomatic  General: Well-developed, well-nourished, no acute distress. Eyes: Pink conjunctiva, anicteric sclera. HEENT: Normocephalic, moist mucous membranes. Lungs: Clear to auscultation bilaterally. Heart: Regular rate and rhythm. No rubs, murmurs, or gallops. Abdomen: Soft, nontender, nondistended. No organomegaly noted, normoactive bowel sounds. Musculoskeletal: No edema, cyanosis, or clubbing. Neuro: Alert, answering all questions appropriately. Cranial nerves grossly intact. Skin: No rashes or petechiae noted. Psych: Normal affect.  LAB RESULTS:  Lab  Results  Component Value Date   NA 134 (L) 01/19/2018   K 3.7 01/19/2018   CL 101 01/19/2018   CO2 25 01/19/2018   GLUCOSE 143 (H) 01/19/2018   BUN 11 01/19/2018   CREATININE 0.72 01/19/2018   CALCIUM 8.7 (L) 01/19/2018   PROT 7.5 04/16/2012   ALBUMIN 3.6 04/16/2012   AST 33 04/16/2012   ALT 39 04/16/2012    ALKPHOS 75 04/16/2012   BILITOT 0.3 04/16/2012   GFRNONAA >60 01/19/2018   GFRAA >60 01/19/2018    Lab Results  Component Value Date   WBC 5.5 01/19/2018   NEUTROABS 2.6 01/19/2018   HGB 12.4 (L) 01/19/2018   HCT 37.8 (L) 01/19/2018   MCV 93.8 01/19/2018   PLT 220 01/19/2018   Lab Results  Component Value Date   TOTALPROTELP 7.1 01/19/2018   ALBUMINELP 3.7 01/19/2018   A1GS 0.2 01/19/2018   A2GS 0.7 01/19/2018   BETS 1.0 01/19/2018   GAMS 1.6 01/19/2018   MSPIKE 0.6 (H) 01/19/2018   SPEI Comment 01/19/2018     STUDIES: Dg Bone Survey Met  Result Date: 02/02/2018 CLINICAL DATA:  Follow-up CT scan.  Lucencies in the pelvis. EXAM: METASTATIC BONE SURVEY COMPARISON:  CT 02/02/2018.  Lumbar spine 10/24/2015. FINDINGS: Imaging of the axial and appendicular skeleton performed. Diffuse severe osteopenia noted. Multiple lucencies in the skull. Prior cervical spine fusion. Diffuse severe cervical, thoracic, and lumbar degenerative change. Thoracolumbar spine scoliosis. Lucencies again noted the pelvis, these are best identified by prior CT. Questionable lucencies in the proximal femurs bilaterally cortical thickening noted the right mid fibula. This may be from prior fracture and healing. Clinical correlation suggested. IMPRESSION: Lucencies noted within the skull, pelvis, and possibly the proximal femur. These findings are suspicious for multiple myeloma and or metastatic disease. 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  PLAN:    1. MGUS: Imaging noted incidental heterogeneous osseous densities within the pelvis possibly consistent with underlying multiple myeloma.  Patient had metastatic bone survey on February 02, 2018 which revealed osseous lesions consistent with multiple myeloma.  He has a mild  M spike of 0.6 with a mild IgM predominance of 237.  His kappa/lambda free light chain ratio is only mildly  elevated at 1.86.  He has no evidence of endorgan damage with a normal creatinine, calcium level, and CBC.  His PSA is within normal limits.  Given the results of the metastatic bone survey, patient will require a bone marrow biopsy for further evaluation and confirmation of multiple myeloma. Bone marrow biopsy will be scheduled for next week and patient will then return to clinic 1 week after his biopsy to discuss the results and any treatment planning necessary.  I spent a total of 30 minutes face-to-face with the patient of which greater than 50% of the visit was spent in counseling and coordination of care as detailed above.  Patient expressed understanding and was in agreement with this plan. He also understands that He can call clinic at any time with any questions, concerns, or complaints.    Lloyd Huger, MD   02/11/2018 8:52 AM

## 2018-02-12 ENCOUNTER — Telehealth: Payer: Self-pay | Admitting: *Deleted

## 2018-02-12 NOTE — Telephone Encounter (Signed)
Left vm for pt regarding CT guided bone marrow biopsy scheduled for 11/14.Pt to arrive at 7:30 for 8:30 procedure. NPO after MN.

## 2018-02-14 ENCOUNTER — Telehealth: Payer: Self-pay

## 2018-02-14 ENCOUNTER — Other Ambulatory Visit: Payer: Self-pay

## 2018-02-14 DIAGNOSIS — D472 Monoclonal gammopathy: Secondary | ICD-10-CM

## 2018-02-14 NOTE — Telephone Encounter (Signed)
Called patient to let him know that his CT bone marrow biopsy was cancelled due to him not having transportation nor someone to come in with him. I told him that once central scheduling called me back with new date and time, that I would call him back. Patient understood and had no further questions at this time.

## 2018-02-15 ENCOUNTER — Ambulatory Visit
Admission: RE | Admit: 2018-02-15 | Discharge: 2018-02-15 | Disposition: A | Discharge status: Home / Self Care | Payer: Medicare HMO | Source: Ambulatory Visit | Attending: Oncology | Admitting: Oncology

## 2018-02-17 NOTE — Progress Notes (Deleted)
Frank Buckley  Telephone:(336) [phone] Fax:(336) [phone]  ID: DANTHONY KENDRIX OB: 1946/02/08  MR#: [id number]  HCW#:[id number]  Patient Care Team: Center, [facility] as PCP - General (General Practice)  CHIEF COMPLAINT: MGUS, possible myeloma  INTERVAL HISTORY: Patient returns to clinic today to discuss his metastatic bone survey results and scheduling a bone marrow biopsy.  He continues to feel well and is at his baseline. He has difficulty walking secondary to an accident years ago.  He has no neurologic complaints.  He denies any recent fevers or illnesses.  He has a good appetite and denies weight loss.  He does not complain of pain today.  He has no chest pain or shortness of breath.  He denies any nausea, vomiting, constipation, or diarrhea.  He has no further hematuria or other urinary complaints.  Patient offers no specific complaints today.  REVIEW OF SYSTEMS:   Review of Systems  Constitutional: Negative.  Negative for fever, malaise/fatigue and weight loss.  Respiratory: Negative.  Negative for cough, hemoptysis and shortness of breath.   Cardiovascular: Negative.  Negative for chest pain and leg swelling.  Gastrointestinal: Negative.  Negative for abdominal pain.  Genitourinary: Negative.  Negative for hematuria.  Musculoskeletal: Negative.  Negative for back pain and joint pain.  Skin: Negative.  Negative for rash.  Neurological: Negative.  Negative for focal weakness, weakness and headaches.  Psychiatric/Behavioral: Negative.  The patient is not nervous/anxious.     As per HPI. Otherwise, a complete review of systems is negative.  PAST MEDICAL HISTORY: Past Medical History:  Diagnosis Date  . Borderline diabetic   . GERD (gastroesophageal reflux disease)     PAST SURGICAL HISTORY: Past Surgical History:  Procedure Laterality Date  . CERVICAL FUSION      FAMILY HISTORY: No family history on file.  ADVANCED DIRECTIVES (Y/N):   N  HEALTH MAINTENANCE: Social History   Tobacco Use  . Smoking status: Former Research scientist (life sciences)  . Smokeless tobacco: Never Used  Substance Use Topics  . Alcohol use: No  . Drug use: Never     Colonoscopy:  PAP:  Bone density:  Lipid panel:  Allergies  Allergen Reactions  . Penicillins Rash    Current Outpatient Medications  Medication Sig Dispense Refill  . diphenhydrAMINE (BENADRYL) 25 mg capsule Take 1 capsule (25 mg total) by mouth every 4 (four) hours as needed. 30 capsule 0  . ibuprofen (ADVIL,MOTRIN) 600 MG tablet Take 1 tablet (600 mg total) by mouth every 6 (six) hours as needed for moderate pain. 30 tablet 0  . omeprazole (PRILOSEC) 20 MG capsule Take 20 mg by mouth daily.    . pravastatin (PRAVACHOL) 20 MG tablet Take 20 mg by mouth daily.     No current facility-administered medications for this visit.     OBJECTIVE: There were no vitals filed for this visit.   There is no height or weight on file to calculate BMI.    ECOG FS:0 - Asymptomatic  General: Well-developed, well-nourished, no acute distress. Eyes: Pink conjunctiva, anicteric sclera. HEENT: Normocephalic, moist mucous membranes. Lungs: Clear to auscultation bilaterally. Heart: Regular rate and rhythm. No rubs, murmurs, or gallops. Abdomen: Soft, nontender, nondistended. No organomegaly noted, normoactive bowel sounds. Musculoskeletal: No edema, cyanosis, or clubbing. Neuro: Alert, answering all questions appropriately. Cranial nerves grossly intact. Skin: No rashes or petechiae noted. Psych: Normal affect.  LAB RESULTS:  Lab Results  Component Value Date   NA 134 (L) 01/19/2018   K 3.7 01/19/2018  CL 101 01/19/2018   CO2 25 01/19/2018   GLUCOSE 143 (H) 01/19/2018   BUN 11 01/19/2018   CREATININE 0.72 01/19/2018   CALCIUM 8.7 (L) 01/19/2018   PROT 7.5 04/16/2012   ALBUMIN 3.6 04/16/2012   AST 33 04/16/2012   ALT 39 04/16/2012   ALKPHOS 75 04/16/2012   BILITOT 0.3 04/16/2012   GFRNONAA >60  01/19/2018   GFRAA >60 01/19/2018    Lab Results  Component Value Date   WBC 5.5 01/19/2018   NEUTROABS 2.6 01/19/2018   HGB 12.4 (L) 01/19/2018   HCT 37.8 (L) 01/19/2018   MCV 93.8 01/19/2018   PLT 220 01/19/2018   Lab Results  Component Value Date   TOTALPROTELP 7.1 01/19/2018   ALBUMINELP 3.7 01/19/2018   A1GS 0.2 01/19/2018   A2GS 0.7 01/19/2018   BETS 1.0 01/19/2018   GAMS 1.6 01/19/2018   MSPIKE 0.6 (H) 01/19/2018   SPEI Comment 01/19/2018     STUDIES: Dg Bone Survey Met  Result Date: 02/02/2018 CLINICAL DATA:  Follow-up CT scan.  Lucencies in the pelvis. EXAM: METASTATIC BONE SURVEY COMPARISON:  CT 02/02/2018.  Lumbar spine 10/24/2015. FINDINGS: Imaging of the axial and appendicular skeleton performed. Diffuse severe osteopenia noted. Multiple lucencies in the skull. Prior cervical spine fusion. Diffuse severe cervical, thoracic, and lumbar degenerative change. Thoracolumbar spine scoliosis. Lucencies again noted the pelvis, these are best identified by prior CT. Questionable lucencies in the proximal femurs bilaterally cortical thickening noted the right mid fibula. This may be from prior fracture and healing. Clinical correlation suggested. IMPRESSION: Lucencies noted within the skull, pelvis, and possibly the proximal femur. These findings are suspicious for multiple myeloma and or metastatic disease. 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  PLAN:    1. MGUS: Imaging noted incidental heterogeneous osseous densities within the pelvis possibly consistent with underlying multiple myeloma.  Patient had metastatic bone survey on February 02, 2018 which revealed osseous lesions consistent with multiple myeloma.  He has a mild  M spike of 0.6 with a mild IgM predominance of 237.  His kappa/lambda free light chain ratio is only mildly elevated at 1.86.  He has no evidence of endorgan damage with a normal  creatinine, calcium level, and CBC.  His PSA is within normal limits.  Given the results of the metastatic bone survey, patient will require a bone marrow biopsy for further evaluation and confirmation of multiple myeloma. Bone marrow biopsy will be scheduled for next week and patient will then return to clinic 1 week after his biopsy to discuss the results and any treatment planning necessary.  I spent a total of 30 minutes face-to-face with the patient of which greater than 50% of the visit was spent in counseling and coordination of care as detailed above.  Patient expressed understanding and was in agreement with this plan. He also understands that He can call clinic at any time with any questions, concerns, or complaints.    Lloyd Huger, MD   02/17/2018 9:02 AM

## 2018-02-22 ENCOUNTER — Ambulatory Visit: Payer: Medicare HMO | Admitting: Oncology

## 2018-03-06 ENCOUNTER — Telehealth: Payer: Self-pay | Admitting: *Deleted

## 2018-03-06 NOTE — Telephone Encounter (Signed)
Left vm for patient regarding new appointment for bone marrow biopsy and follow up. Call placed to patients brother as well with appointments. Patient scheduled for bone marrow biopsy on 12/10 at 9:00, patient to arrive at 8:00. Patient also has follow up scheduled with Dr. Finnegan on 12/20 in [place] for results. Patients brother verbalized understanding of plan and appointments. Patient does not have transportation, we have approval for patient to be transported by the cancer center van.  

## 2018-03-13 ENCOUNTER — Ambulatory Visit
Admission: RE | Admit: 2018-03-13 | Discharge: 2018-03-13 | Disposition: A | Discharge status: Home / Self Care | Payer: Medicare HMO | Source: Ambulatory Visit | Attending: Oncology | Admitting: Oncology

## 2018-03-13 ENCOUNTER — Other Ambulatory Visit (HOSPITAL_COMMUNITY)
Admission: RE | Admit: 2018-03-13 | Disposition: A | Discharge status: Home / Self Care | Payer: Medicare HMO | Source: Ambulatory Visit | Attending: Oncology | Admitting: Oncology

## 2018-03-13 DIAGNOSIS — Z791 Long term (current) use of non-steroidal anti-inflammatories (NSAID): Secondary | ICD-10-CM | POA: Insufficient documentation

## 2018-03-13 DIAGNOSIS — C9000 Multiple myeloma not having achieved remission: Secondary | ICD-10-CM | POA: Diagnosis present

## 2018-03-13 DIAGNOSIS — K219 Gastro-esophageal reflux disease without esophagitis: Secondary | ICD-10-CM | POA: Insufficient documentation

## 2018-03-13 DIAGNOSIS — Z79899 Other long term (current) drug therapy: Secondary | ICD-10-CM | POA: Diagnosis not present

## 2018-03-13 DIAGNOSIS — R7303 Prediabetes: Secondary | ICD-10-CM | POA: Diagnosis not present

## 2018-03-13 DIAGNOSIS — D472 Monoclonal gammopathy: Secondary | ICD-10-CM | POA: Insufficient documentation

## 2018-03-13 DIAGNOSIS — Z87891 Personal history of nicotine dependence: Secondary | ICD-10-CM | POA: Diagnosis not present

## 2018-03-13 DIAGNOSIS — D649 Anemia, unspecified: Secondary | ICD-10-CM | POA: Insufficient documentation

## 2018-03-13 DIAGNOSIS — Z880 Allergy status to penicillin: Secondary | ICD-10-CM | POA: Diagnosis not present

## 2018-03-13 LAB — CBC
HCT: 36.2 % — ABNORMAL LOW (ref 39.0–52.0)
HEMOGLOBIN: 11.8 g/dL — AB (ref 13.0–17.0)
MCH: 30.3 pg (ref 26.0–34.0)
MCHC: 32.6 g/dL (ref 30.0–36.0)
MCV: 93.1 fL (ref 80.0–100.0)
PLATELETS: 234 10*3/uL (ref 150–400)
RBC: 3.89 MIL/uL — ABNORMAL LOW (ref 4.22–5.81)
RDW: 12.7 % (ref 11.5–15.5)
WBC: 5.7 10*3/uL (ref 4.0–10.5)
nRBC: 0 % (ref 0.0–0.2)

## 2018-03-13 LAB — DIFFERENTIAL
Basophils Absolute: 0.1 10*3/uL (ref 0.0–0.1)
Basophils Relative: 1 %
EOS ABS: 0.3 10*3/uL (ref 0.0–0.5)
EOS PCT: 5 %
Lymphocytes Relative: 26 %
Lymphs Abs: 1.5 10*3/uL (ref 0.7–4.0)
MONO ABS: 0.5 10*3/uL (ref 0.1–1.0)
MONOS PCT: 8 %
Neutro Abs: 3.5 10*3/uL (ref 1.7–7.7)
Neutrophils Relative %: 60 %

## 2018-03-13 LAB — PROTIME-INR
INR: 1.04
PROTHROMBIN TIME: 13.5 s (ref 11.4–15.2)

## 2018-03-13 LAB — APTT: APTT: 29 s (ref 24–36)

## 2018-03-13 MED ORDER — HEPARIN SOD (PORK) LOCK FLUSH 100 UNIT/ML IV SOLN
INTRAVENOUS | Status: AC
  Filled 2018-03-13: qty 5

## 2018-03-13 MED ORDER — LIDOCAINE HCL (PF) 1 % IJ SOLN
INTRAMUSCULAR | Status: AC | PRN
  Administered 2018-03-13: 10 mL

## 2018-03-13 MED ORDER — SODIUM CHLORIDE 0.9 % IV SOLN
INTRAVENOUS | Status: DC
  Administered 2018-03-13: 09:00:00 via INTRAVENOUS

## 2018-03-13 MED ORDER — FENTANYL CITRATE (PF) 100 MCG/2ML IJ SOLN
INTRAMUSCULAR | Status: AC | PRN
  Administered 2018-03-13: 50 ug via INTRAVENOUS

## 2018-03-13 MED ORDER — MIDAZOLAM HCL 5 MG/5ML IJ SOLN
INTRAMUSCULAR | Status: AC
  Filled 2018-03-13: qty 5

## 2018-03-13 MED ORDER — FENTANYL CITRATE (PF) 100 MCG/2ML IJ SOLN
INTRAMUSCULAR | Status: AC
  Filled 2018-03-13: qty 4

## 2018-03-13 MED ORDER — MIDAZOLAM HCL 2 MG/2ML IJ SOLN
INTRAMUSCULAR | Status: AC | PRN
  Administered 2018-03-13 (×2): 1 mg via INTRAVENOUS

## 2018-03-13 NOTE — Consult Note (Signed)
Chief Complaint: Multiple myeloma  Referring Physician(s): Finnegan,Timothy J  Patient Status: [facility] - Out-pt  History of Present Illness: Frank Buckley is a 72 y.o. male with past medical history significant for GERD and prediabetes with recent diagnosis of multiple myeloma who presents today for CT-guided bone marrow biopsy for tissue diagnostic purposes.  Patient is unaccompanied and serves as his own historian.  Patient reports mild right-sided mid abdominal pain which he attributes to something he ate.  He denies nauseousness, vomiting, bloody or melanotic stools.    He is otherwise without complaint.  Specifically, no change in energy level.  No unintentional weight loss.  No chest pain, shortness of breath, fever or chills.  Past Medical History:  Diagnosis Date  . Borderline diabetic   . GERD (gastroesophageal reflux disease)     Past Surgical History:  Procedure Laterality Date  . CERVICAL FUSION      Allergies: Penicillins  Medications: Prior to Admission medications   Medication Sig Start Date End Date Taking? Authorizing Provider  ibuprofen (ADVIL,MOTRIN) 600 MG tablet Take 1 tablet (600 mg total) by mouth every 6 (six) hours as needed for moderate pain. 11/27/15  Yes Duanne Guess, PA-C  omeprazole (PRILOSEC) 20 MG capsule Take 20 mg by mouth daily.   Yes [provider]  pravastatin (PRAVACHOL) 20 MG tablet Take 20 mg by mouth daily.   Yes [provider]  diphenhydrAMINE (BENADRYL) 25 mg capsule Take 1 capsule (25 mg total) by mouth every 4 (four) hours as needed. 11/27/15 11/26/16  Duanne Guess, PA-C     No family history on file.  Social History   Socioeconomic History  . Marital status: Single    Spouse name: Not on file  . Number of children: Not on file  . Years of education: Not on file  . Highest education level: Not on file  Occupational History  . Not on file  Social Needs  . Financial resource strain: Not on  file  . Food insecurity:    Worry: Not on file    Inability: Not on file  . Transportation needs:    Medical: Not on file    Non-medical: Not on file  Tobacco Use  . Smoking status: Former Research scientist (life sciences)  . Smokeless tobacco: Never Used  Substance and Sexual Activity  . Alcohol use: No  . Drug use: Never  . Sexual activity: Yes  Lifestyle  . Physical activity:    Days per week: Not on file    Minutes per session: Not on file  . Stress: Not on file  Relationships  . Social connections:    Talks on phone: Not on file    Gets together: Not on file    Attends religious service: Not on file    Active member of club or organization: Not on file    Attends meetings of clubs or organizations: Not on file    Relationship status: Not on file  Other Topics Concern  . Not on file  Social History Narrative  . Not on file    ECOG Status: 0 - Asymptomatic  Review of Systems: A 12 point ROS discussed and pertinent positives are indicated in the HPI above.  All other systems are negative.  Review of Systems  Constitutional: Negative for activity change, appetite change, fatigue, fever and unexpected weight change.  Respiratory: Negative.   Cardiovascular: Negative.   Gastrointestinal:       Patient reports mild right-sided mid abdominal pain which  he attributes to something he ate.    Vital Signs: BP 129/69   Pulse 78   Temp 98.5 F (36.9 C) (Oral)   Resp 16   Ht 5' 9"  (1.753 m)   Wt 74.8 kg   SpO2 98%   BMI 24.37 kg/m   Physical Exam  Constitutional: He appears well-developed.  HENT:  Head: Atraumatic.  Cardiovascular: Normal rate and regular rhythm.  Pulmonary/Chest: Effort normal and breath sounds normal.  Skin: Skin is dry.  Psychiatric: He has a normal mood and affect. His behavior is normal.  Nursing note and vitals reviewed.   Imaging: No results found.  Labs:  CBC: Recent Labs    01/19/18 1403 03/13/18 0844  WBC 5.5 5.7  HGB 12.4* 11.8*  HCT 37.8* 36.2*   PLT 220 234    COAGS: Recent Labs    03/13/18 0844  INR 1.04  APTT 29    BMP: Recent Labs    12/19/17 0835 01/19/18 1403  NA  --  134*  K  --  3.7  CL  --  101  CO2  --  25  GLUCOSE  --  143*  BUN 13 11  CALCIUM  --  8.7*  CREATININE 0.69 0.72  GFRNONAA >60 >60  GFRAA >60 >60    LIVER FUNCTION TESTS: No results for input(s): BILITOT, AST, ALT, ALKPHOS, PROT, ALBUMIN in the last 8760 hours.  TUMOR MARKERS: No results for input(s): AFPTM, CEA, CA199, CHROMGRNA in the last 8760 hours.  Assessment and Plan:  Frank Buckley is a 72 y.o. male with past medical history significant for GERD and prediabetes with recent diagnosis of multiple myeloma who presents today for CT-guided bone marrow biopsy for tissue diagnostic purposes.  Risks and benefits of CT-guided bone marrow biopsy and aspiration was discussed with the patient including, but not limited to bleeding, infection, damage to adjacent structures or low yield requiring additional tests.  All of the patient's questions were answered, patient is agreeable to proceed.  Consent signed and in chart.  Thank you for this interesting consult.  I greatly enjoyed meeting Frank Buckley and look forward to participating in their care.  A copy of this report was sent to the requesting provider on this date.  Electronically Signed: Sandi Mariscal, MD 03/13/2018, 9:05 AM   I spent a total of 15 Minutes in face to face in clinical consultation, greater than 50% of which was counseling/coordinating care for CT-guided bone marrow biopsy and aspiration

## 2018-03-13 NOTE — Procedures (Signed)
Pre-procedure Diagnosis: Multiple Myeloma Post-procedure Diagnosis: Same  Technically successful CT guided bone marrow aspiration and biopsy of left iliac crest.   Complications: None Immediate  EBL: None  SignedSandi Mariscal Pager: [phone] 03/13/2018, 10:01 AM

## 2018-03-13 NOTE — Discharge Instructions (Signed)
Moderate Conscious Sedation, Adult, Care After These instructions provide you with information about caring for yourself after your procedure. Your health care provider may also give you more specific instructions. Your treatment has been planned according to current medical practices, but problems sometimes occur. Call your health care provider if you have any problems or questions after your procedure. What can I expect after the procedure? After your procedure, it is common:  To feel sleepy for several hours.  To feel clumsy and have poor balance for several hours.  To have poor judgment for several hours.  To vomit if you eat too soon.  Follow these instructions at home: For at least 24 hours after the procedure:   Do not: ? Participate in activities where you could fall or become injured. ? Drive. ? Use heavy machinery. ? Drink alcohol. ? Take sleeping pills or medicines that cause drowsiness. ? Make important decisions or sign legal documents. ? Take care of children on your own.  Rest. Eating and drinking  Follow the diet recommended by your health care provider.  If you vomit: ? Drink water, juice, or soup when you can drink without vomiting. ? Make sure you have little or no nausea before eating solid foods. General instructions  Have a responsible adult stay with you until you are awake and alert.  Take over-the-counter and prescription medicines only as told by your health care provider.  If you smoke, do not smoke without supervision.  Keep all follow-up visits as told by your health care provider. This is important. Contact a health care provider if:  You keep feeling nauseous or you keep vomiting.  You feel light-headed.  You develop a rash.  You have a fever. Get help right away if:  You have trouble breathing. This information is not intended to replace advice given to you by your health care provider. Make sure you discuss any questions you have  with your health care provider. Document Released: 01/09/2013 Document Revised: 08/24/2015 Document Reviewed: 07/11/2015 Elsevier Interactive Patient Education  2018 [place]. Bone Marrow Aspiration and Bone Marrow Biopsy, Adult, Care After This sheet gives you information about how to care for yourself after your procedure. Your health care provider may also give you more specific instructions. If you have problems or questions, contact your health care provider. What can I expect after the procedure? After the procedure, it is common to have:  Mild pain and tenderness.  Swelling.  Bruising.  Follow these instructions at home:  Take over-the-counter or prescription medicines only as told by your health care provider.  Do not take baths, swim, or use a hot tub until your health care provider approves. Ask if you can take a shower or have a sponge bath.  Follow instructions from your health care provider about how to take care of the puncture site. Make sure you: ? Wash your hands with soap and water before you change your bandage (dressing). If soap and water are not available, use hand sanitizer. ? Change your dressing as told by your health care provider.  Check your puncture siteevery day for signs of infection. Check for: ? More redness, swelling, or pain. ? More fluid or blood. ? Warmth. ? Pus or a bad smell.  Return to your normal activities as told by your health care provider. Ask your health care provider what activities are safe for you.  Do not drive for 24 hours if you were given a medicine to help you relax (sedative).  Keep all follow-up visits as told by your health care provider. This is important. °Contact a health care provider if: °· You have more redness, swelling, or pain around the puncture site. °· You have more fluid or blood coming from the puncture site. °· Your puncture site feels warm to the touch. °· You have pus or a bad smell coming from the  puncture site. °· You have a fever. °· Your pain is not controlled with medicine. °This information is not intended to replace advice given to you by your health care provider. Make sure you discuss any questions you have with your health care provider. °Document Released: 10/08/2004 Document Revised: 10/09/2015 Document Reviewed: 09/02/2015 °Elsevier Interactive Patient Education © 2018 Elsevier Inc. ° °

## 2018-03-18 NOTE — Progress Notes (Signed)
[place]  Telephone:([phone]) [phone] Fax:(336) [phone]  ID: Frank Buckley OB: [birth year]  MR#: [id number]  PYP#:[id number]  Patient Care Team: [facility] as PCP - General (General Practice)  CHIEF COMPLAINT: MGUS  INTERVAL HISTORY: Patient returns to clinic today to discuss his metastatic bone survey results and scheduling a bone marrow biopsy.  He continues to feel well and is at his baseline. He has no neurologic complaints.  He denies any recent fevers or illnesses.  He has a good appetite and denies weight loss.  He does not complain of pain today.  He has no chest pain or shortness of breath.  He denies any nausea, vomiting, constipation, or diarrhea.  He has no urinary complaints.  Patient offers no specific complaints today.  REVIEW OF SYSTEMS:   Review of Systems  Constitutional: Negative.  Negative for fever, malaise/fatigue and weight loss.  Respiratory: Negative.  Negative for cough, hemoptysis and shortness of breath.   Cardiovascular: Negative.  Negative for chest pain and leg swelling.  Gastrointestinal: Negative.  Negative for abdominal pain.  Genitourinary: Negative.  Negative for hematuria.  Musculoskeletal: Negative.  Negative for back pain and joint pain.  Skin: Negative.  Negative for rash.  Neurological: Negative.  Negative for focal weakness, weakness and headaches.  Psychiatric/Behavioral: Negative.  The patient is not nervous/anxious.     As per HPI. Otherwise, a complete review of systems is negative.  PAST MEDICAL HISTORY: Past Medical History:  Diagnosis Date  . Borderline diabetic   . GERD (gastroesophageal reflux disease)     PAST SURGICAL HISTORY: Past Surgical History:  Procedure Laterality Date  . CERVICAL FUSION      FAMILY HISTORY: No family history on file.  ADVANCED DIRECTIVES (Y/N):  N  HEALTH MAINTENANCE: Social History   Tobacco Use  . Smoking status: Former Research scientist (life sciences)  . Smokeless tobacco:  Never Used  Substance Use Topics  . Alcohol use: No  . Drug use: Never     Colonoscopy:  PAP:  Bone density:  Lipid panel:  Allergies  Allergen Reactions  . Penicillins Rash    Current Outpatient Medications  Medication Sig Dispense Refill  . ibuprofen (ADVIL,MOTRIN) 600 MG tablet Take 1 tablet (600 mg total) by mouth every 6 (six) hours as needed for moderate pain. 30 tablet 0  . omeprazole (PRILOSEC) 20 MG capsule Take 20 mg by mouth daily.    . pravastatin (PRAVACHOL) 20 MG tablet Take 20 mg by mouth daily.    . diphenhydrAMINE (BENADRYL) 25 mg capsule Take 1 capsule (25 mg total) by mouth every 4 (four) hours as needed. 30 capsule 0   No current facility-administered medications for this visit.     OBJECTIVE: Vitals:   03/23/18 0905  BP: (!) 143/74  Pulse: 68  Resp: 18  Temp: (!) 97 F (36.1 C)     Body mass index is 25.99 kg/m.    ECOG FS:0 - Asymptomatic  General: Well-developed, well-nourished, no acute distress. Eyes: Pink conjunctiva, anicteric sclera. HEENT: Normocephalic, moist mucous membranes, clear oropharnyx. Lungs: Clear to auscultation bilaterally. Heart: Regular rate and rhythm. No rubs, murmurs, or gallops. Abdomen: Soft, nontender, nondistended. No organomegaly noted, normoactive bowel sounds. Musculoskeletal: No edema, cyanosis, or clubbing. Neuro: Alert, answering all questions appropriately. Cranial nerves grossly intact. Skin: No rashes or petechiae noted. Psych: Normal affect. Lymphatics: No cervical, calvicular, axillary or inguinal LAD.  LAB RESULTS:  Lab Results  Component Value Date   NA 134 (L) 01/19/2018  K 3.7 01/19/2018   CL 101 01/19/2018   CO2 25 01/19/2018   GLUCOSE 143 (H) 01/19/2018   BUN 11 01/19/2018   CREATININE 0.72 01/19/2018   CALCIUM 8.7 (L) 01/19/2018   PROT 7.5 04/16/2012   ALBUMIN 3.6 04/16/2012   AST 33 04/16/2012   ALT 39 04/16/2012   ALKPHOS 75 04/16/2012   BILITOT 0.3 04/16/2012   GFRNONAA >60  01/19/2018   GFRAA >60 01/19/2018    Lab Results  Component Value Date   WBC 5.7 03/13/2018   NEUTROABS 3.5 03/13/2018   HGB 11.8 (L) 03/13/2018   HCT 36.2 (L) 03/13/2018   MCV 93.1 03/13/2018   PLT 234 03/13/2018   Lab Results  Component Value Date   TOTALPROTELP 7.1 01/19/2018   ALBUMINELP 3.7 01/19/2018   A1GS 0.2 01/19/2018   A2GS 0.7 01/19/2018   BETS 1.0 01/19/2018   GAMS 1.6 01/19/2018   MSPIKE 0.6 (H) 01/19/2018   SPEI Comment 01/19/2018     STUDIES: Ct Bone Marrow Biopsy & Aspiration  Result Date: 03/13/2018 INDICATION: History of multiple myeloma. Please perform CT-guided bone marrow biopsy for tissue diagnostic purposes. EXAM: CT-GUIDED BONE MARROW BIOPSY AND ASPIRATION MEDICATIONS: None ANESTHESIA/SEDATION: Fentanyl 50 mcg IV; Versed 2 mg IV Sedation Time: 12 Minutes; The patient was continuously monitored during the procedure by the interventional radiology nurse under my direct supervision. COMPLICATIONS: None immediate. PROCEDURE: Informed consent was obtained from the patient following an explanation of the procedure, risks, benefits and alternatives. The patient understands, agrees and consents for the procedure. All questions were addressed. A time out was performed prior to the initiation of the procedure. The patient was positioned prone and non-contrast localization CT was performed of the pelvis to demonstrate the iliac marrow spaces. The operative site was prepped and draped in the usual sterile fashion. Under sterile conditions and local anesthesia, a 22 gauge spinal needle was utilized for procedural planning. Next, an 11 gauge coaxial bone biopsy needle was advanced into the left iliac marrow space. Needle position was confirmed with CT imaging. Initially, bone marrow aspiration was performed. Next, a bone marrow biopsy was obtained with the 11 gauge outer bone marrow device. The 11 gauge coaxial bone biopsy needle was re-advanced into a slightly different  location within the left iliac marrow space, positioning was confirmed and an additional bone marrow biopsy was obtained. Samples were prepared with the cytotechnologist and deemed adequate. The needle was removed intact. Hemostasis was obtained with compression and a dressing was placed. The patient tolerated the procedure well without immediate post procedural complication. IMPRESSION: Successful CT guided left iliac bone marrow aspiration and core biopsy. Electronically Signed   By: Sandi Mariscal M.D.   On: 03/13/2018 10:24    ASSESSMENT: MGUS.  PLAN:    1. MGUS: Bone marrow biopsy completed on March 13, 2018 revealed a slightly hypercellular bone marrow with trilineage hematopoiesis.  Patient had 5% plasma cells.  Cytogenetic studies were normal.  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  He has a mild M spike of 0.6 with a mild IgM predominance of 237.  His kappa/lambda free light chain ratio is only mildly elevated at 1.86.  He has no evidence of endorgan damage with a normal creatinine, calcium level, and CBC.  His PSA is within normal limits.  No intervention is needed at this time.  Repeat laboratory work in 6 months. 2.  Widespread osseous lesions: Given results of bone marrow biopsy and a normal  PSA, this is not myeloma or prostate cancer.  Will continue work-up to determine an etiology therefore will order CT of the chest, abdomen, and pelvis for further evaluation.  Patient will return to clinic in 2 to 3 weeks after his imaging to discuss the results.    I spent a total of 30 minutes face-to-face with the patient of which greater than 50% of the visit was spent in counseling and coordination of care as detailed above.   Patient expressed understanding and was in agreement with this plan. He also understands that He can call clinic at any time with any questions, concerns, or complaints.    Lloyd Huger, MD   03/23/2018 10:37  AM

## 2018-03-20 ENCOUNTER — Telehealth: Payer: Self-pay | Admitting: *Deleted

## 2018-03-20 NOTE — Telephone Encounter (Signed)
Patient informed of doctor response 

## 2018-03-20 NOTE — Telephone Encounter (Signed)
Patient requesting call back with lab results. Patient has appointment Friday  FINAL DIAGNOSIS Diagnosis Bone Marrow, Aspirate,Biopsy, and Clot, left posterior iliac crest - SLIGHTLY HYPERCELLULAR BONE MARROW FOR AGE WITH TRILINEAGE HEMATOPOIESIS. - SLIGHT PLASMACYTOSIS (PLASMA CELLS 5%). - SEE COMMENT. PERIPHERAL BLOOD: - MILD NORMOCYTIC - NORMOCHROMIC ANEMIA. Diagnosis Note The bone marrow is slightly hypercellular for age with trilineage hematopoiesis and essentially orderly and progressive maturation of all myeloid cell lines. The plasma cells are slightly increased in number representing 5% of all cells with lack of large aggregates or sheets. Immunohistochemical stains highlight the plasma cells component which shows polyclonal staining pattern for kappa and lambda light chains although there is lambda light chain excess. The findings are limited and not considered diagnostic of plasma cell neoplasm. Correlation with cytogenetic studies is recommended. (BNS:ah/gt 03/14/18) Frank Greenhouse MD Pathologist, Electronic Signature (Case signed 03/15/2018) GROSS AND MICROSCOPIC INFORMATION Specimen Clinical Information recent Dx of multiple myeloma, post CT guided BM Bx [rd] Source Bone Marrow, Aspirate,Biopsy, and Clot, left posterior iliac crest Microscopic LAB DATA: CBC performed on 03/13/2018 shows: WBC 5.7 K/ul Neutrophils 58% 1 of

## 2018-03-20 NOTE — Telephone Encounter (Signed)
Would prefer to discuss in detail on Friday at his appt.

## 2018-03-21 ENCOUNTER — Encounter (HOSPITAL_COMMUNITY): Payer: Self-pay | Admitting: Oncology

## 2018-03-23 ENCOUNTER — Inpatient Hospital Stay: Payer: Medicare HMO | Attending: Oncology | Admitting: Oncology

## 2018-03-23 VITALS — BP 143/74 | HR 68 | Temp 97.0°F | Resp 18 | Wt 176.0 lb

## 2018-03-23 DIAGNOSIS — M8580 Other specified disorders of bone density and structure, unspecified site: Secondary | ICD-10-CM | POA: Diagnosis not present

## 2018-03-23 DIAGNOSIS — K219 Gastro-esophageal reflux disease without esophagitis: Secondary | ICD-10-CM | POA: Insufficient documentation

## 2018-03-23 DIAGNOSIS — Z79899 Other long term (current) drug therapy: Secondary | ICD-10-CM | POA: Diagnosis not present

## 2018-03-23 DIAGNOSIS — Z87891 Personal history of nicotine dependence: Secondary | ICD-10-CM

## 2018-03-23 DIAGNOSIS — D472 Monoclonal gammopathy: Secondary | ICD-10-CM | POA: Diagnosis not present

## 2018-03-23 NOTE — Progress Notes (Signed)
Patient here today for follow up regarding bone marrow biopsy results.

## 2018-04-06 NOTE — Progress Notes (Deleted)
[place]  [phone]) [phone] [phone]) [phone]  ID: Frank Buckley OB: 1946/01/03  MR#: [id number]  BEE#:[id number]  Patient Care Team: Center, [facility] as PCP - General (General Practice)  CHIEF COMPLAINT: MGUS  INTERVAL HISTORY: Patient returns to clinic today to discuss his metastatic bone survey results and scheduling a bone marrow biopsy.  He continues to feel well and is at his baseline. He has no neurologic complaints.  He denies any recent fevers or illnesses.  He has a good appetite and denies weight loss.  He does not complain of pain today.  He has no chest pain or shortness of breath.  He denies any nausea, vomiting, constipation, or diarrhea.  He has no urinary complaints.  Patient offers no specific complaints today.  REVIEW OF SYSTEMS:   Review of Systems  Constitutional: Negative.  Negative for fever, malaise/fatigue and weight loss.  Respiratory: Negative.  Negative for cough, hemoptysis and shortness of breath.   Cardiovascular: Negative.  Negative for chest pain and leg swelling.  Gastrointestinal: Negative.  Negative for abdominal pain.  Genitourinary: Negative.  Negative for hematuria.  Musculoskeletal: Negative.  Negative for back pain and joint pain.  Skin: Negative.  Negative for rash.  Neurological: Negative.  Negative for focal weakness, weakness and headaches.  Psychiatric/Behavioral: Negative.  The patient is not nervous/anxious.     As per HPI. Otherwise, a complete review of systems is negative.  PAST MEDICAL HISTORY: Past Medical History:  Diagnosis Date  . Borderline diabetic   . GERD (gastroesophageal reflux disease)     PAST SURGICAL HISTORY: Past Surgical History:  Procedure Laterality Date  . CERVICAL FUSION      FAMILY HISTORY: No family history on file.  ADVANCED DIRECTIVES (Y/N):  N  HEALTH MAINTENANCE: Social History   Tobacco Use  . Smoking status: Former Research scientist (life sciences)  . Smokeless tobacco:  Never Used  Substance Use Topics  . Alcohol use: No  . Drug use: Never     Colonoscopy:  PAP:  Bone density:  Lipid panel:  Allergies  Allergen Reactions  . Penicillins Rash    Current Outpatient Medications  Medication Sig Dispense Refill  . diphenhydrAMINE (BENADRYL) 25 mg capsule Take 1 capsule (25 mg total) by mouth every 4 (four) hours as needed. 30 capsule 0  . ibuprofen (ADVIL,MOTRIN) 600 MG tablet Take 1 tablet (600 mg total) by mouth every 6 (six) hours as needed for moderate pain. 30 tablet 0  . omeprazole (PRILOSEC) 20 MG capsule Take 20 mg by mouth daily.    . pravastatin (PRAVACHOL) 20 MG tablet Take 20 mg by mouth daily.     No current facility-administered medications for this visit.     OBJECTIVE: There were no vitals filed for this visit.   There is no height or weight on file to calculate BMI.    ECOG FS:0 - Asymptomatic  General: Well-developed, well-nourished, no acute distress. Eyes: Pink conjunctiva, anicteric sclera. HEENT: Normocephalic, moist mucous membranes, clear oropharnyx. Lungs: Clear to auscultation bilaterally. Heart: Regular rate and rhythm. No rubs, murmurs, or gallops. Abdomen: Soft, nontender, nondistended. No organomegaly noted, normoactive bowel sounds. Musculoskeletal: No edema, cyanosis, or clubbing. Neuro: Alert, answering all questions appropriately. Cranial nerves grossly intact. Skin: No rashes or petechiae noted. Psych: Normal affect. Lymphatics: No cervical, calvicular, axillary or inguinal LAD.  LAB RESULTS:  Lab Results  Component Value Date   NA 134 (L) 01/19/2018   K 3.7 01/19/2018   CL 101 01/19/2018  CO2 25 01/19/2018   GLUCOSE 143 (H) 01/19/2018   BUN 11 01/19/2018   CREATININE 0.72 01/19/2018   CALCIUM 8.7 (L) 01/19/2018   PROT 7.5 04/16/2012   ALBUMIN 3.6 04/16/2012   AST 33 04/16/2012   ALT 39 04/16/2012   ALKPHOS 75 04/16/2012   BILITOT 0.3 04/16/2012   GFRNONAA >60 01/19/2018   GFRAA >60 01/19/2018     Lab Results  Component Value Date   WBC 5.7 03/13/2018   NEUTROABS 3.5 03/13/2018   HGB 11.8 (L) 03/13/2018   HCT 36.2 (L) 03/13/2018   MCV 93.1 03/13/2018   PLT 234 03/13/2018   Lab Results  Component Value Date   TOTALPROTELP 7.1 01/19/2018   ALBUMINELP 3.7 01/19/2018   A1GS 0.2 01/19/2018   A2GS 0.7 01/19/2018   BETS 1.0 01/19/2018   GAMS 1.6 01/19/2018   MSPIKE 0.6 (H) 01/19/2018   SPEI Comment 01/19/2018     STUDIES: Ct Bone Marrow Biopsy & Aspiration  Result Date: 03/13/2018 INDICATION: History of multiple myeloma. Please perform CT-guided bone marrow biopsy for tissue diagnostic purposes. EXAM: CT-GUIDED BONE MARROW BIOPSY AND ASPIRATION MEDICATIONS: None ANESTHESIA/SEDATION: Fentanyl 50 mcg IV; Versed 2 mg IV Sedation Time: 12 Minutes; The patient was continuously monitored during the procedure by the interventional radiology nurse under my direct supervision. COMPLICATIONS: None immediate. PROCEDURE: Informed consent was obtained from the patient following an explanation of the procedure, risks, benefits and alternatives. The patient understands, agrees and consents for the procedure. All questions were addressed. A time out was performed prior to the initiation of the procedure. The patient was positioned prone and non-contrast localization CT was performed of the pelvis to demonstrate the iliac marrow spaces. The operative site was prepped and draped in the usual sterile fashion. Under sterile conditions and local anesthesia, a 22 gauge spinal needle was utilized for procedural planning. Next, an 11 gauge coaxial bone biopsy needle was advanced into the left iliac marrow space. Needle position was confirmed with CT imaging. Initially, bone marrow aspiration was performed. Next, a bone marrow biopsy was obtained with the 11 gauge outer bone marrow device. The 11 gauge coaxial bone biopsy needle was re-advanced into a slightly different location within the left iliac marrow  space, positioning was confirmed and an additional bone marrow biopsy was obtained. Samples were prepared with the cytotechnologist and deemed adequate. The needle was removed intact. Hemostasis was obtained with compression and a dressing was placed. The patient tolerated the procedure well without immediate post procedural complication. IMPRESSION: Successful CT guided left iliac bone marrow aspiration and core biopsy. Electronically Signed   By: Sandi Mariscal M.D.   On: 03/13/2018 10:24    ASSESSMENT: MGUS.  PLAN:    1. MGUS: Bone marrow biopsy completed on March 13, 2018 revealed a slightly hypercellular bone marrow with trilineage hematopoiesis.  Patient had 5% plasma cells.  Cytogenetic studies were normal.  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  He has a mild M spike of 0.6 with a mild IgM predominance of 237.  His kappa/lambda free light chain ratio is only mildly elevated at 1.86.  He has no evidence of endorgan damage with a normal creatinine, calcium level, and CBC.  His PSA is within normal limits.  No intervention is needed at this time.  Repeat laboratory work in 6 months. 2.  Widespread osseous lesions: Given results of bone marrow biopsy and a normal PSA, this is not myeloma or prostate cancer.  Will  continue work-up to determine an etiology therefore will order CT of the chest, abdomen, and pelvis for further evaluation.  Patient will return to clinic in 2 to 3 weeks after his imaging to discuss the results.    I spent a total of 30 minutes face-to-face with the patient of which greater than 50% of the visit was spent in counseling and coordination of care as detailed above.   Patient expressed understanding and was in agreement with this plan. He also understands that He can call clinic at any time with any questions, concerns, or complaints.    Lloyd Huger, MD   04/06/2018 2:07 PM

## 2018-04-10 ENCOUNTER — Ambulatory Visit: Admission: RE | Admit: 2018-04-10 | Payer: Medicare HMO | Source: Ambulatory Visit

## 2018-04-13 ENCOUNTER — Ambulatory Visit: Payer: Medicare HMO | Admitting: Oncology

## 2018-04-14 NOTE — Progress Notes (Deleted)
[place]  [phone]) [phone] [phone]) [phone]  ID: SHAIL URBAS OB: [birth year]  MR#: [id number]  BXU#:[id number]  Patient Care Team: Center, [facility] as PCP - General (General Practice)  CHIEF COMPLAINT: MGUS  INTERVAL HISTORY: Patient returns to clinic today to discuss his metastatic bone survey results and scheduling a bone marrow biopsy.  He continues to feel well and is at his baseline. He has no neurologic complaints.  He denies any recent fevers or illnesses.  He has a good appetite and denies weight loss.  He does not complain of pain today.  He has no chest pain or shortness of breath.  He denies any nausea, vomiting, constipation, or diarrhea.  He has no urinary complaints.  Patient offers no specific complaints today.  REVIEW OF SYSTEMS:   Review of Systems  Constitutional: Negative.  Negative for fever, malaise/fatigue and weight loss.  Respiratory: Negative.  Negative for cough, hemoptysis and shortness of breath.   Cardiovascular: Negative.  Negative for chest pain and leg swelling.  Gastrointestinal: Negative.  Negative for abdominal pain.  Genitourinary: Negative.  Negative for hematuria.  Musculoskeletal: Negative.  Negative for back pain and joint pain.  Skin: Negative.  Negative for rash.  Neurological: Negative.  Negative for focal weakness, weakness and headaches.  Psychiatric/Behavioral: Negative.  The patient is not nervous/anxious.     As per HPI. Otherwise, a complete review of systems is negative.  PAST MEDICAL HISTORY: Past Medical History:  Diagnosis Date  . Borderline diabetic   . GERD (gastroesophageal reflux disease)     PAST SURGICAL HISTORY: Past Surgical History:  Procedure Laterality Date  . CERVICAL FUSION      FAMILY HISTORY: No family history on file.  ADVANCED DIRECTIVES (Y/N):  N  HEALTH MAINTENANCE: Social History   Tobacco Use  . Smoking status: Former Research scientist (life sciences)  . Smokeless tobacco:  Never Used  Substance Use Topics  . Alcohol use: No  . Drug use: Never     Colonoscopy:  PAP:  Bone density:  Lipid panel:  Allergies  Allergen Reactions  . Penicillins Rash    Current Outpatient Medications  Medication Sig Dispense Refill  . diphenhydrAMINE (BENADRYL) 25 mg capsule Take 1 capsule (25 mg total) by mouth every 4 (four) hours as needed. 30 capsule 0  . ibuprofen (ADVIL,MOTRIN) 600 MG tablet Take 1 tablet (600 mg total) by mouth every 6 (six) hours as needed for moderate pain. 30 tablet 0  . omeprazole (PRILOSEC) 20 MG capsule Take 20 mg by mouth daily.    . pravastatin (PRAVACHOL) 20 MG tablet Take 20 mg by mouth daily.     No current facility-administered medications for this visit.     OBJECTIVE: There were no vitals filed for this visit.   There is no height or weight on file to calculate BMI.    ECOG FS:0 - Asymptomatic  General: Well-developed, well-nourished, no acute distress. Eyes: Pink conjunctiva, anicteric sclera. HEENT: Normocephalic, moist mucous membranes, clear oropharnyx. Lungs: Clear to auscultation bilaterally. Heart: Regular rate and rhythm. No rubs, murmurs, or gallops. Abdomen: Soft, nontender, nondistended. No organomegaly noted, normoactive bowel sounds. Musculoskeletal: No edema, cyanosis, or clubbing. Neuro: Alert, answering all questions appropriately. Cranial nerves grossly intact. Skin: No rashes or petechiae noted. Psych: Normal affect. Lymphatics: No cervical, calvicular, axillary or inguinal LAD.  LAB RESULTS:  Lab Results  Component Value Date   NA 134 (L) 01/19/2018   K 3.7 01/19/2018   CL 101 01/19/2018  CO2 25 01/19/2018   GLUCOSE 143 (H) 01/19/2018   BUN 11 01/19/2018   CREATININE 0.72 01/19/2018   CALCIUM 8.7 (L) 01/19/2018   PROT 7.5 04/16/2012   ALBUMIN 3.6 04/16/2012   AST 33 04/16/2012   ALT 39 04/16/2012   ALKPHOS 75 04/16/2012   BILITOT 0.3 04/16/2012   GFRNONAA >60 01/19/2018   GFRAA >60 01/19/2018     Lab Results  Component Value Date   WBC 5.7 03/13/2018   NEUTROABS 3.5 03/13/2018   HGB 11.8 (L) 03/13/2018   HCT 36.2 (L) 03/13/2018   MCV 93.1 03/13/2018   PLT 234 03/13/2018   Lab Results  Component Value Date   TOTALPROTELP 7.1 01/19/2018   ALBUMINELP 3.7 01/19/2018   A1GS 0.2 01/19/2018   A2GS 0.7 01/19/2018   BETS 1.0 01/19/2018   GAMS 1.6 01/19/2018   MSPIKE 0.6 (H) 01/19/2018   SPEI Comment 01/19/2018     STUDIES: No results found.  ASSESSMENT: MGUS.  PLAN:    1. MGUS: Bone marrow biopsy completed on March 13, 2018 revealed a slightly hypercellular bone marrow with trilineage hematopoiesis.  Patient had 5% plasma cells.  Cytogenetic studies were normal.  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  He has a mild M spike of 0.6 with a mild IgM predominance of 237.  His kappa/lambda free light chain ratio is only mildly elevated at 1.86.  He has no evidence of endorgan damage with a normal creatinine, calcium level, and CBC.  His PSA is within normal limits.  No intervention is needed at this time.  Repeat laboratory work in 6 months. 2.  Widespread osseous lesions: Given results of bone marrow biopsy and a normal PSA, this is not myeloma or prostate cancer.  Will continue work-up to determine an etiology therefore will order CT of the chest, abdomen, and pelvis for further evaluation.  Patient will return to clinic in 2 to 3 weeks after his imaging to discuss the results.    I spent a total of 30 minutes face-to-face with the patient of which greater than 50% of the visit was spent in counseling and coordination of care as detailed above.   Patient expressed understanding and was in agreement with this plan. He also understands that He can call clinic at any time with any questions, concerns, or complaints.    Lloyd Huger, MD   04/14/2018 8:27 AM

## 2018-04-17 ENCOUNTER — Other Ambulatory Visit: Payer: Self-pay | Admitting: *Deleted

## 2018-04-17 DIAGNOSIS — D472 Monoclonal gammopathy: Secondary | ICD-10-CM

## 2018-04-18 ENCOUNTER — Ambulatory Visit: Admission: RE | Admit: 2018-04-18 | Payer: Medicare HMO | Source: Ambulatory Visit

## 2018-04-20 ENCOUNTER — Ambulatory Visit: Payer: Medicare HMO | Admitting: Oncology

## 2018-04-29 NOTE — Progress Notes (Deleted)
[place]  [phone]) [phone] [phone]) [phone]  ID: Frank Buckley OB: 10/22/1945  MR#: [id number]  AUQ#:[id number]  Patient Care Team: Center, [facility] as PCP - General (General Practice)  CHIEF COMPLAINT: MGUS  INTERVAL HISTORY: Patient returns to clinic today to discuss his metastatic bone survey results and scheduling a bone marrow biopsy.  He continues to feel well and is at his baseline. He has no neurologic complaints.  He denies any recent fevers or illnesses.  He has a good appetite and denies weight loss.  He does not complain of pain today.  He has no chest pain or shortness of breath.  He denies any nausea, vomiting, constipation, or diarrhea.  He has no urinary complaints.  Patient offers no specific complaints today.  REVIEW OF SYSTEMS:   Review of Systems  Constitutional: Negative.  Negative for fever, malaise/fatigue and weight loss.  Respiratory: Negative.  Negative for cough, hemoptysis and shortness of breath.   Cardiovascular: Negative.  Negative for chest pain and leg swelling.  Gastrointestinal: Negative.  Negative for abdominal pain.  Genitourinary: Negative.  Negative for hematuria.  Musculoskeletal: Negative.  Negative for back pain and joint pain.  Skin: Negative.  Negative for rash.  Neurological: Negative.  Negative for focal weakness, weakness and headaches.  Psychiatric/Behavioral: Negative.  The patient is not nervous/anxious.     As per HPI. Otherwise, a complete review of systems is negative.  PAST MEDICAL HISTORY: Past Medical History:  Diagnosis Date  . Borderline diabetic   . GERD (gastroesophageal reflux disease)     PAST SURGICAL HISTORY: Past Surgical History:  Procedure Laterality Date  . CERVICAL FUSION      FAMILY HISTORY: No family history on file.  ADVANCED DIRECTIVES (Y/N):  N  HEALTH MAINTENANCE: Social History   Tobacco Use  . Smoking status: Former Research scientist (life sciences)  . Smokeless tobacco:  Never Used  Substance Use Topics  . Alcohol use: No  . Drug use: Never     Colonoscopy:  PAP:  Bone density:  Lipid panel:  Allergies  Allergen Reactions  . Penicillins Rash    Current Outpatient Medications  Medication Sig Dispense Refill  . diphenhydrAMINE (BENADRYL) 25 mg capsule Take 1 capsule (25 mg total) by mouth every 4 (four) hours as needed. 30 capsule 0  . ibuprofen (ADVIL,MOTRIN) 600 MG tablet Take 1 tablet (600 mg total) by mouth every 6 (six) hours as needed for moderate pain. 30 tablet 0  . omeprazole (PRILOSEC) 20 MG capsule Take 20 mg by mouth daily.    . pravastatin (PRAVACHOL) 20 MG tablet Take 20 mg by mouth daily.     No current facility-administered medications for this visit.     OBJECTIVE: There were no vitals filed for this visit.   There is no height or weight on file to calculate BMI.    ECOG FS:0 - Asymptomatic  General: Well-developed, well-nourished, no acute distress. Eyes: Pink conjunctiva, anicteric sclera. HEENT: Normocephalic, moist mucous membranes, clear oropharnyx. Lungs: Clear to auscultation bilaterally. Heart: Regular rate and rhythm. No rubs, murmurs, or gallops. Abdomen: Soft, nontender, nondistended. No organomegaly noted, normoactive bowel sounds. Musculoskeletal: No edema, cyanosis, or clubbing. Neuro: Alert, answering all questions appropriately. Cranial nerves grossly intact. Skin: No rashes or petechiae noted. Psych: Normal affect. Lymphatics: No cervical, calvicular, axillary or inguinal LAD.  LAB RESULTS:  Lab Results  Component Value Date   NA 134 (L) 01/19/2018   K 3.7 01/19/2018   CL 101 01/19/2018  CO2 25 01/19/2018   GLUCOSE 143 (H) 01/19/2018   BUN 11 01/19/2018   CREATININE 0.72 01/19/2018   CALCIUM 8.7 (L) 01/19/2018   PROT 7.5 04/16/2012   ALBUMIN 3.6 04/16/2012   AST 33 04/16/2012   ALT 39 04/16/2012   ALKPHOS 75 04/16/2012   BILITOT 0.3 04/16/2012   GFRNONAA >60 01/19/2018   GFRAA >60 01/19/2018     Lab Results  Component Value Date   WBC 5.7 03/13/2018   NEUTROABS 3.5 03/13/2018   HGB 11.8 (L) 03/13/2018   HCT 36.2 (L) 03/13/2018   MCV 93.1 03/13/2018   PLT 234 03/13/2018   Lab Results  Component Value Date   TOTALPROTELP 7.1 01/19/2018   ALBUMINELP 3.7 01/19/2018   A1GS 0.2 01/19/2018   A2GS 0.7 01/19/2018   BETS 1.0 01/19/2018   GAMS 1.6 01/19/2018   MSPIKE 0.6 (H) 01/19/2018   SPEI Comment 01/19/2018     STUDIES: No results found.  ASSESSMENT: MGUS.  PLAN:    1. MGUS: Bone marrow biopsy completed on March 13, 2018 revealed a slightly hypercellular bone marrow with trilineage hematopoiesis.  Patient had 5% plasma cells.  Cytogenetic studies were normal.  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  He has a mild M spike of 0.6 with a mild IgM predominance of 237.  His kappa/lambda free light chain ratio is only mildly elevated at 1.86.  He has no evidence of endorgan damage with a normal creatinine, calcium level, and CBC.  His PSA is within normal limits.  No intervention is needed at this time.  Repeat laboratory work in 6 months. 2.  Widespread osseous lesions: Given results of bone marrow biopsy and a normal PSA, this is not myeloma or prostate cancer.  Will continue work-up to determine an etiology therefore will order CT of the chest, abdomen, and pelvis for further evaluation.  Patient will return to clinic in 2 to 3 weeks after his imaging to discuss the results.    I spent a total of 30 minutes face-to-face with the patient of which greater than 50% of the visit was spent in counseling and coordination of care as detailed above.   Patient expressed understanding and was in agreement with this plan. He also understands that He can call clinic at any time with any questions, concerns, or complaints.    Lloyd Huger, MD   04/29/2018 11:33 PM

## 2018-05-02 ENCOUNTER — Inpatient Hospital Stay: Payer: Medicare HMO | Attending: Oncology

## 2018-05-02 ENCOUNTER — Ambulatory Visit
Admission: RE | Admit: 2018-05-02 | Discharge: 2018-05-02 | Disposition: A | Discharge status: Home / Self Care | Payer: Medicare HMO | Source: Ambulatory Visit | Attending: Oncology | Admitting: Oncology

## 2018-05-02 ENCOUNTER — Other Ambulatory Visit: Payer: Self-pay | Admitting: *Deleted

## 2018-05-02 ENCOUNTER — Other Ambulatory Visit: Admit: 2018-05-02 | Payer: Medicare HMO

## 2018-05-02 DIAGNOSIS — D472 Monoclonal gammopathy: Secondary | ICD-10-CM | POA: Insufficient documentation

## 2018-05-02 LAB — CREATININE, SERUM
Creatinine, Ser: 0.71 mg/dL (ref 0.61–1.24)
GFR calc Af Amer: >60 mL/min (ref >60)
GFR calc non Af Amer: >60 mL/min (ref >60)

## 2018-05-02 MED ORDER — IOHEXOL 300 MG/ML  SOLN
100.0000 mL | Freq: Once | INTRAMUSCULAR | Status: AC | PRN
  Administered 2018-05-02: 100 mL via INTRAVENOUS

## 2018-05-03 LAB — PROTEIN ELECTROPHORESIS, SERUM
A/G Ratio: 1.1 (ref 0.7–1.7)
ALPHA-2-GLOBULIN: 1 g/dL (ref 0.4–1.0)
Albumin ELP: 4 g/dL (ref 2.9–4.4)
Alpha-1-Globulin: 0.2 g/dL (ref 0.0–0.4)
Beta Globulin: 0.8 g/dL (ref 0.7–1.3)
Gamma Globulin: 1.6 g/dL (ref 0.4–1.8)
Globulin, Total: 3.6 g/dL (ref 2.2–3.9)
M-Spike, %: 0.5 g/dL — ABNORMAL HIGH
Total Protein ELP: 7.6 g/dL (ref 6.0–8.5)

## 2018-05-04 ENCOUNTER — Inpatient Hospital Stay: Payer: Medicare HMO | Admitting: Oncology

## 2018-05-04 ENCOUNTER — Other Ambulatory Visit: Payer: Medicare HMO

## 2018-05-11 ENCOUNTER — Ambulatory Visit: Payer: Medicare HMO | Admitting: Oncology

## 2018-05-31 ENCOUNTER — Telehealth: Payer: Self-pay | Admitting: Oncology

## 2018-05-31 NOTE — Telephone Encounter (Signed)
Left Vm on patients line and brothers line to contact office to r\s missed appt. Pt called about not receiving call about results, but was supposed to see provider in the office for results.

## 2018-06-04 DIAGNOSIS — R911 Solitary pulmonary nodule: Secondary | ICD-10-CM | POA: Insufficient documentation

## 2018-06-04 NOTE — Progress Notes (Deleted)
[place]  [phone] [phone]) [phone]  ID: Frank Buckley OB: [birth year]  MR#: [id number]  BOF#:[id number]  Patient Care Team: [facility], [facility] as PCP - General (General Practice)  CHIEF COMPLAINT: MGUS, right lower lobe lung nodule  INTERVAL HISTORY: Patient returns to clinic today to discuss his metastatic bone survey results and scheduling a bone marrow biopsy.  He continues to feel well and is at his baseline. He has no neurologic complaints.  He denies any recent fevers or illnesses.  He has a good appetite and denies weight loss.  He does not complain of pain today.  He has no chest pain or shortness of breath.  He denies any nausea, vomiting, constipation, or diarrhea.  He has no urinary complaints.  Patient offers no specific complaints today.  REVIEW OF SYSTEMS:   Review of Systems  Constitutional: Negative.  Negative for fever, malaise/fatigue and weight loss.  Respiratory: Negative.  Negative for cough, hemoptysis and shortness of breath.   Cardiovascular: Negative.  Negative for chest pain and leg swelling.  Gastrointestinal: Negative.  Negative for abdominal pain.  Genitourinary: Negative.  Negative for hematuria.  Musculoskeletal: Negative.  Negative for back pain and joint pain.  Skin: Negative.  Negative for rash.  Neurological: Negative.  Negative for focal weakness, weakness and headaches.  Psychiatric/Behavioral: Negative.  The patient is not nervous/anxious.     As per HPI. Otherwise, a complete review of systems is negative.  PAST MEDICAL HISTORY: Past Medical History:  Diagnosis Date  . Borderline diabetic   . GERD (gastroesophageal reflux disease)     PAST SURGICAL HISTORY: Past Surgical History:  Procedure Laterality Date  . CERVICAL FUSION      FAMILY HISTORY: No family history on file.  ADVANCED DIRECTIVES (Y/N):  N  HEALTH MAINTENANCE: Social History   Tobacco Use  . Smoking status: Former  Research scientist (life sciences)  . Smokeless tobacco: Never Used  Substance Use Topics  . Alcohol use: No  . Drug use: Never     Colonoscopy:  PAP:  Bone density:  Lipid panel:  Allergies  Allergen Reactions  . Penicillins Rash    Current Outpatient Medications  Medication Sig Dispense Refill  . diphenhydrAMINE (BENADRYL) 25 mg capsule Take 1 capsule (25 mg total) by mouth every 4 (four) hours as needed. 30 capsule 0  . ibuprofen (ADVIL,MOTRIN) 600 MG tablet Take 1 tablet (600 mg total) by mouth every 6 (six) hours as needed for moderate pain. 30 tablet 0  . omeprazole (PRILOSEC) 20 MG capsule Take 20 mg by mouth daily.    . pravastatin (PRAVACHOL) 20 MG tablet Take 20 mg by mouth daily.     No current facility-administered medications for this visit.     OBJECTIVE: There were no vitals filed for this visit.   There is no height or weight on file to calculate BMI.    ECOG FS:0 - Asymptomatic  General: Well-developed, well-nourished, no acute distress. Eyes: Pink conjunctiva, anicteric sclera. HEENT: Normocephalic, moist mucous membranes, clear oropharnyx. Lungs: Clear to auscultation bilaterally. Heart: Regular rate and rhythm. No rubs, murmurs, or gallops. Abdomen: Soft, nontender, nondistended. No organomegaly noted, normoactive bowel sounds. Musculoskeletal: No edema, cyanosis, or clubbing. Neuro: Alert, answering all questions appropriately. Cranial nerves grossly intact. Skin: No rashes or petechiae noted. Psych: Normal affect. Lymphatics: No cervical, calvicular, axillary or inguinal LAD.  LAB RESULTS:  Lab Results  Component Value Date   NA 134 (L) 01/19/2018   K 3.7 01/19/2018  CL 101 01/19/2018   CO2 25 01/19/2018   GLUCOSE 143 (H) 01/19/2018   BUN 11 01/19/2018   CREATININE 0.71 05/02/2018   CALCIUM 8.7 (L) 01/19/2018   PROT 7.5 04/16/2012   ALBUMIN 3.6 04/16/2012   AST 33 04/16/2012   ALT 39 04/16/2012   ALKPHOS 75 04/16/2012   BILITOT 0.3 04/16/2012   GFRNONAA >60  05/02/2018   GFRAA >60 05/02/2018    Lab Results  Component Value Date   WBC 5.7 03/13/2018   NEUTROABS 3.5 03/13/2018   HGB 11.8 (L) 03/13/2018   HCT 36.2 (L) 03/13/2018   MCV 93.1 03/13/2018   PLT 234 03/13/2018   Lab Results  Component Value Date   TOTALPROTELP 7.6 05/02/2018   ALBUMINELP 4.0 05/02/2018   A1GS 0.2 05/02/2018   A2GS 1.0 05/02/2018   BETS 0.8 05/02/2018   GAMS 1.6 05/02/2018   MSPIKE 0.5 (H) 05/02/2018   SPEI Comment 05/02/2018     STUDIES: No results found.  ASSESSMENT: MGUS, right lower lobe lung nodule.  PLAN:    1. MGUS: Bone marrow biopsy completed on March 13, 2018 revealed a slightly hypercellular bone marrow with trilineage hematopoiesis.  Patient had 5% plasma cells.  Cytogenetic studies were normal.  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  He has a mild M spike of 0.6 with a mild IgM predominance of 237.  His kappa/lambda free light chain ratio is only mildly elevated at 1.86.  He has no evidence of endorgan damage with a normal creatinine, calcium level, and CBC.  His PSA is within normal limits.  No intervention is needed at this time.  Repeat laboratory work in 6 months. 2.  Widespread osseous lesions: Given results of bone marrow biopsy and a normal PSA, this is not myeloma or prostate cancer.  Will continue work-up to determine an etiology therefore will order CT of the chest, abdomen, and pelvis for further evaluation.  Patient will return to clinic in 2 to 3 weeks after his imaging to discuss the results.  3. Right lower lobe lung nodule:   I spent a total of 30 minutes face-to-face with the patient of which greater than 50% of the visit was spent in counseling and coordination of care as detailed above.   Patient expressed understanding and was in agreement with this plan. He also understands that He can call clinic at any time with any questions, concerns, or complaints.    Lloyd Huger, MD   06/04/2018 11:24 PM

## 2018-06-08 ENCOUNTER — Inpatient Hospital Stay: Payer: Medicare HMO | Admitting: Oncology

## 2018-06-27 ENCOUNTER — Ambulatory Visit: Payer: Medicare HMO | Admitting: Urology

## 2018-07-04 ENCOUNTER — Emergency Department
Admission: EM | Admit: 2018-07-04 | Discharge: 2018-07-04 | Disposition: A | Discharge status: Home / Self Care | Payer: Medicare Other | Attending: Emergency Medicine | Admitting: Emergency Medicine

## 2018-07-04 ENCOUNTER — Other Ambulatory Visit: Payer: Self-pay

## 2018-07-04 ENCOUNTER — Emergency Department: Payer: Medicare Other

## 2018-07-04 ENCOUNTER — Encounter: Payer: Self-pay | Admitting: Emergency Medicine

## 2018-07-04 DIAGNOSIS — Z87891 Personal history of nicotine dependence: Secondary | ICD-10-CM | POA: Diagnosis not present

## 2018-07-04 DIAGNOSIS — M79604 Pain in right leg: Secondary | ICD-10-CM | POA: Diagnosis not present

## 2018-07-04 DIAGNOSIS — Z79899 Other long term (current) drug therapy: Secondary | ICD-10-CM | POA: Insufficient documentation

## 2018-07-04 MED ORDER — TRAMADOL HCL 50 MG PO TABS
50.0000 mg | ORAL_TABLET | Freq: Once | ORAL | Status: AC
  Administered 2018-07-04: 50 mg via ORAL
  Filled 2018-07-04: qty 1

## 2018-07-04 MED ORDER — KETOROLAC TROMETHAMINE 30 MG/ML IJ SOLN
30.0000 mg | Freq: Once | INTRAMUSCULAR | Status: AC
  Administered 2018-07-04: 15:00:00 30 mg via INTRAMUSCULAR
  Filled 2018-07-04: qty 1

## 2018-07-04 MED ORDER — TRAMADOL HCL 50 MG PO TABS: 50.0000 mg | ORAL_TABLET | Freq: Four times a day (QID) | ORAL | 0 refills | Status: DC | PRN

## 2018-07-04 NOTE — ED Provider Notes (Signed)
[facility] Emergency Department Provider Note  ____________________________________________   First MD Initiated Contact with Patient 07/04/18 1308     (approximate)  I have reviewed the triage vital signs and the nursing notes.   HISTORY  Chief Complaint Leg Pain    HPI Frank Buckley is a 73 y.o. male presents emergency department complaining of right leg pain and right hip pain.  He denies any known injury.  He states it hurts more to bear weight.  States he is hardly been able to walk due to the pain.  He is a cancer patient.  States his appointments have been put off until June.    Past Medical History:  Diagnosis Date  . Borderline diabetic   . GERD (gastroesophageal reflux disease)     Patient Active Problem List   Diagnosis Date Noted  . 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 Abnormal CT scan 01/21/2018    Past Surgical History:  Procedure Laterality Date  . CERVICAL FUSION      Prior to Admission medications   Medication Sig Start Date End Date Taking? Authorizing Provider  diphenhydrAMINE (BENADRYL) 25 mg capsule Take 1 capsule (25 mg total) by mouth every 4 (four) hours as needed. 11/27/15 11/26/16  Duanne Guess, PA-C  ibuprofen (ADVIL,MOTRIN) 600 MG tablet Take 1 tablet (600 mg total) by mouth every 6 (six) hours as needed for moderate pain. 11/27/15   Duanne Guess, PA-C  omeprazole (PRILOSEC) 20 MG capsule Take 20 mg by mouth daily.    [provider]  pravastatin (PRAVACHOL) 20 MG tablet Take 20 mg by mouth daily.    [provider]  traMADol (ULTRAM) 50 MG tablet Take 1 tablet (50 mg total) by mouth every 6 (six) hours as needed. 07/04/18   Versie Starks, PA-C    Allergies Penicillins  No family history on file.  Social History Social History   Tobacco Use  . Smoking status: Former Research scientist (life sciences)  . Smokeless tobacco: Never  Used  Substance Use Topics  . Alcohol use: No  . Drug use: Never    Review of Systems  Constitutional: No fever/chills Eyes: No visual changes. ENT: No sore throat. Respiratory: Denies cough Genitourinary: Negative for dysuria. Musculoskeletal: Negative for back pain.  Positive for right hip and right leg pain Skin: Negative for rash.    ____________________________________________   PHYSICAL EXAM:  VITAL SIGNS: ED Triage Vitals  Enc Vitals Group     BP 07/04/18 1302 (!) 146/70     Pulse Rate 07/04/18 1302 72     Resp 07/04/18 1302 16     Temp 07/04/18 1302 97.6 F (36.4 C)     Temp Source 07/04/18 1302 Oral     SpO2 07/04/18 1302 98 %     Weight 07/04/18 1303 175 lb (79.4 kg)     Height 07/04/18 1303 5\' 10"  (1.778 m)     Head Circumference --      Peak Flow --      Pain Score 07/04/18 1303 0     Pain Loc --      Pain Edu? --      Excl. in [place]? --     Constitutional: Alert and oriented. Well appearing and in no acute distress. Eyes: Conjunctivae are normal.  Head: Atraumatic. Nose: No congestion/rhinnorhea. Mouth/Throat: Mucous membranes are moist.   Neck:  supple no lymphadenopathy noted Cardiovascular: Normal  rate, regular rhythm. Heart sounds are normal Respiratory: Normal respiratory effort.  No retractions, lungs c t a  GU: deferred Musculoskeletal: Right hip is tender, the right lower leg is tender at the calf and behind the knee, neurovascular appears to be intact, the patient does have difficulty standing and is having to use a cane to bear weight  neurologic:  Normal speech and language.  Skin:  Skin is warm, dry and intact. No rash noted. Psychiatric: Mood and affect are normal. Speech and behavior are normal.  ____________________________________________   LABS (all labs ordered are listed, but only abnormal results are displayed)  Labs Reviewed - No data to display ____________________________________________    ____________________________________________  RADIOLOGY  X-ray of the right hip and knee show degenerative changes only Ultrasound of the right lower leg is negative  ____________________________________________   PROCEDURES  Procedure(s) performed: toradol 30mg  im, tramadol 1 po  Procedures    ____________________________________________   INITIAL IMPRESSION / ASSESSMENT AND PLAN / ED COURSE  Pertinent labs & imaging results that were available during my care of the patient were reviewed by me and considered in my medical decision making (see chart for details).   Patient is 73 year old male presents emergency department complaining of right leg pain.  Patient is a cancer patient.  States all of his appointments have been put off until June due to a coronavirus.  He is worried as he is having more pain with bearing weight.  He denies any known injuries.  He denies chest pain or shortness of breath.  Physical exam shows patient is tender along the hip knee and calf.  X-ray of the hip and knee are negative for any acute abnormalities but show degenerative changes Ultrasound of the right lower leg is negative for DVT  Explained the findings to the patient.  He is to follow-up with his regular doctor if not better in 5 to 7 days.  He is to bear weight as tolerated.  He was instructed to use his cane and/or a walker.  He states he understands and will comply.  He was given a rx for tramadol.  He is to take only as needed.  Caution was expressed to him about taking the medication and him becoming dizzy.  He states he understands and will comply.  He was discharged in stable condition.     As part of my medical decision making, I reviewed the following data within the [place] notes reviewed and incorporated, Old chart reviewed, Radiograph reviewed xray of hip,knee was negative.  [place] of right lower leg was negative., Notes from prior ED visits and [place]  Controlled Substance Database  ____________________________________________   FINAL CLINICAL IMPRESSION(S) / ED DIAGNOSES  Final diagnoses:  Right leg pain      NEW MEDICATIONS STARTED DURING THIS VISIT:  New Prescriptions   TRAMADOL (ULTRAM) 50 MG TABLET    Take 1 tablet (50 mg total) by mouth every 6 (six) hours as needed.     Note:  This document was prepared using Dragon voice recognition software and may include unintentional dictation errors.    Versie Starks, PA-C 07/04/18 [place], [place], MD 07/05/18 1539

## 2018-07-04 NOTE — ED Triage Notes (Signed)
Pt in via POV with right leg pain x 2 days, denies any recent injury.  States he is unable to ambulate due to pain.

## 2018-07-04 NOTE — Discharge Instructions (Addendum)
Follow-up with your regular doctor if not better in 3 days.  Use the pain medication as needed.  Use your cane or walker at home.  Return if worsening

## 2018-07-04 NOTE — ED Notes (Signed)
Pt able to ambulate with cane, and instructed on how to use walker. Pt demonstrated use.

## 2018-07-04 NOTE — ED Notes (Signed)
Patient transported to Ultrasound 

## 2018-07-19 ENCOUNTER — Other Ambulatory Visit: Payer: Self-pay | Admitting: Family Medicine

## 2018-07-19 DIAGNOSIS — M545 Low back pain, unspecified: Secondary | ICD-10-CM

## 2018-07-27 ENCOUNTER — Other Ambulatory Visit: Payer: Self-pay

## 2018-07-27 ENCOUNTER — Ambulatory Visit
Admission: RE | Admit: 2018-07-27 | Discharge: 2018-07-27 | Disposition: A | Discharge status: Home / Self Care | Payer: Medicare Other | Source: Ambulatory Visit | Attending: Family Medicine | Admitting: Family Medicine

## 2018-07-27 DIAGNOSIS — M545 Low back pain, unspecified: Secondary | ICD-10-CM

## 2018-09-04 ENCOUNTER — Encounter: Payer: Self-pay | Admitting: Urology

## 2018-09-04 ENCOUNTER — Ambulatory Visit: Payer: Medicare HMO | Admitting: Urology

## 2018-11-06 ENCOUNTER — Telehealth: Payer: Self-pay | Admitting: Urology

## 2018-11-06 NOTE — Telephone Encounter (Signed)
Please reschedule Mr. Schnepf's missed appointment for follow up on hematuria.

## 2018-11-13 NOTE — Progress Notes (Deleted)
11/14/2018 12:47 PM   [name] 11-13-1945 [id number]  Referring provider: [facility], [facility] [place],  [place] [address]  No chief complaint on file.   HPI: 73 year old male with a history of hematuria (high risk) who presents today for follow up.  Former smoker.  CTU in 12/2017 revealed possible left nephrolithiasis. No other explanation for hematuria.  Aortic Atherosclerosis (ICD10-I70.0).  Right hepatic lobe hemangioma.  Heterogeneous density throughout the pelvis. Although this could relate to aggressive osteoporosis, recommend clinical correlation for multiple myeloma. - is being followed by oncology.  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  He denies any further gross hematuria.  His UA today ***.       PMH: Past Medical History:  Diagnosis Date  . Borderline diabetic   . GERD (gastroesophageal reflux disease)     Surgical History: Past Surgical History:  Procedure Laterality Date  . CERVICAL FUSION      Home Medications:  Allergies as of 11/14/2018      Reactions   Penicillins Rash      Medication List       Accurate as of November 13, 2018 12:47 PM. If you have any questions, ask your nurse or doctor.        diphenhydrAMINE 25 mg capsule Commonly known as: BENADRYL Take 1 capsule (25 mg total) by mouth every 4 (four) hours as needed.   ibuprofen 600 MG tablet Commonly known as: ADVIL Take 1 tablet (600 mg total) by mouth every 6 (six) hours as needed for moderate pain.   omeprazole 20 MG capsule Commonly known as: PRILOSEC Take 20 mg by mouth daily.   pravastatin 20 MG tablet Commonly known as: PRAVACHOL Take 20 mg by mouth daily.   traMADol 50 MG tablet Commonly known as: ULTRAM Take 1 tablet (50 mg total) by mouth every 6 (six) hours as needed.       Allergies:  Allergies  Allergen Reactions  . Penicillins Rash    Family History: No family history on file.   Social History:  reports that he has quit smoking. He has never used smokeless tobacco. He reports that he does not drink alcohol or use drugs.  ROS:                                        Physical Exam: There were no vitals taken for this visit.  Constitutional:  Well nourished. Alert and oriented, No acute distress. HEENT: [place] AT, moist mucus membranes.  Trachea midline, no masses. Cardiovascular: No clubbing, cyanosis, or edema. Respiratory: Normal respiratory effort, no increased work of breathing. GI: Abdomen is soft, non tender, non distended, no abdominal masses. Liver and spleen not palpable.  No hernias appreciated.  Stool sample for occult testing is not indicated.   GU: No CVA tenderness.  No bladder fullness or masses.  Patient with circumcised/uncircumcised phallus. ***Foreskin easily retracted***  Urethral meatus is patent.  No penile discharge. No penile lesions or rashes. Scrotum without lesions, cysts, rashes and/or edema.  Testicles are located scrotally bilaterally. No masses are appreciated in the testicles. Left and right epididymis are normal. Rectal: Patient with  normal sphincter tone. Anus and perineum without scarring or rashes. No rectal masses are appreciated. Prostate is approximately *** grams, *** nodules are appreciated. Seminal vesicles are normal. Skin: No rashes, bruises or  suspicious lesions. Lymph: No cervical or inguinal adenopathy. Neurologic: Grossly intact, no focal deficits, moving all 4 extremities. Psychiatric: Normal mood and affect.  Laboratory Data: Lab Results  Component Value Date   WBC 5.7 03/13/2018   HGB 11.8 (L) 03/13/2018   HCT 36.2 (L) 03/13/2018   MCV 93.1 03/13/2018   PLT 234 03/13/2018    Lab Results  Component Value Date   CREATININE 0.71 05/02/2018    No results found for: PSA  No results found for: TESTOSTERONE  No results found for: HGBA1C  No results found for: TSH  No results found for:  CHOL, HDL, CHOLHDL, VLDL, LDLCALC  Lab Results  Component Value Date   AST 33 04/16/2012   Lab Results  Component Value Date   ALT 39 04/16/2012   No components found for: ALKALINEPHOPHATASE No components found for: BILIRUBINTOTAL  No results found for: ESTRADIOL   Urinalysis See HPI and see Epic.  I have reviewed the labs.   Assessment & Plan:   1. History of hematuria (high risk) Hematuria work up completed in 12/2017 - findings positive for hypervascular prostate No report of gross hematuria *** UA today *** RTC in one year for UA - patient to report any gross hematuria in the interim    No follow-ups on file.  These notes generated with voice recognition software. I apologize for typographical errors.  Zara Council, PA-C  [facility] [address] [place], [place]  [place], [place] [address] [phone]

## 2018-11-14 ENCOUNTER — Ambulatory Visit: Payer: Medicare Other | Admitting: Urology

## 2018-11-24 ENCOUNTER — Emergency Department: Payer: Medicare Other

## 2018-11-24 ENCOUNTER — Emergency Department
Admission: EM | Admit: 2018-11-24 | Discharge: 2018-11-24 | Disposition: A | Discharge status: Home / Self Care | Payer: Medicare Other | Attending: Student in an Organized Health Care Education/Training Program | Admitting: Student in an Organized Health Care Education/Training Program

## 2018-11-24 ENCOUNTER — Other Ambulatory Visit: Payer: Self-pay

## 2018-11-24 DIAGNOSIS — Z87891 Personal history of nicotine dependence: Secondary | ICD-10-CM | POA: Diagnosis not present

## 2018-11-24 DIAGNOSIS — R600 Localized edema: Secondary | ICD-10-CM | POA: Diagnosis not present

## 2018-11-24 DIAGNOSIS — M7989 Other specified soft tissue disorders: Secondary | ICD-10-CM

## 2018-11-24 LAB — CBC WITH DIFFERENTIAL/PLATELET
Abs Immature Granulocytes: 0.01 10*3/uL (ref 0.00–0.07)
Basophils Absolute: 0.1 10*3/uL (ref 0.0–0.1)
Basophils Relative: 1 %
Eosinophils Absolute: 0.5 10*3/uL (ref 0.0–0.5)
Eosinophils Relative: 10 %
HCT: 32.1 % — ABNORMAL LOW (ref 39.0–52.0)
Hemoglobin: 10.6 g/dL — ABNORMAL LOW (ref 13.0–17.0)
Immature Granulocytes: 0 %
Lymphocytes Relative: 27 %
Lymphs Abs: 1.3 10*3/uL (ref 0.7–4.0)
MCH: 31.1 pg (ref 26.0–34.0)
MCHC: 33 g/dL (ref 30.0–36.0)
MCV: 94.1 fL (ref 80.0–100.0)
Monocytes Absolute: 0.5 10*3/uL (ref 0.1–1.0)
Monocytes Relative: 11 %
Neutro Abs: 2.4 10*3/uL (ref 1.7–7.7)
Neutrophils Relative %: 51 %
Platelets: 192 10*3/uL (ref 150–400)
RBC: 3.41 MIL/uL — ABNORMAL LOW (ref 4.22–5.81)
RDW: 12.7 % (ref 11.5–15.5)
WBC: 4.8 10*3/uL (ref 4.0–10.5)
nRBC: 0 % (ref 0.0–0.2)

## 2018-11-24 LAB — BRAIN NATRIURETIC PEPTIDE: B Natriuretic Peptide: 27 pg/mL (ref 0.0–100.0)

## 2018-11-24 LAB — COMPREHENSIVE METABOLIC PANEL
ALT: 22 U/L (ref 0–44)
AST: 25 U/L (ref 15–41)
Albumin: 3.5 g/dL (ref 3.5–5.0)
Alkaline Phosphatase: 59 U/L (ref 38–126)
Anion gap: 6 (ref 5–15)
BUN: 15 mg/dL (ref 8–23)
CO2: 26 mmol/L (ref 22–32)
Calcium: 8.6 mg/dL — ABNORMAL LOW (ref 8.9–10.3)
Chloride: 107 mmol/L (ref 98–111)
Creatinine, Ser: 0.8 mg/dL (ref 0.61–1.24)
GFR calc Af Amer: >60 mL/min (ref >60)
GFR calc non Af Amer: >60 mL/min (ref >60)
Glucose, Bld: 94 mg/dL (ref 70–99)
Potassium: 3.7 mmol/L (ref 3.5–5.1)
Sodium: 139 mmol/L (ref 135–145)
Total Bilirubin: 0.7 mg/dL (ref 0.3–1.2)
Total Protein: 6.7 g/dL (ref 6.5–8.1)

## 2018-11-24 MED ORDER — LOTRIMIN AF POWDER 2 % EX AERP: 1.0000 "application " | INHALATION_SPRAY | Freq: Two times a day (BID) | CUTANEOUS | 0 refills | Status: DC

## 2018-11-24 NOTE — Discharge Instructions (Addendum)
Please follow-up with PCP.  I do recommend you purchase compression stockings to help with your leg swelling.

## 2018-11-24 NOTE — ED Provider Notes (Signed)
[facility] Emergency Department Provider Note    First MD Initiated Contact with Patient 11/24/18 1429     (approximate)  I have reviewed the triage vital signs and the nursing notes.   HISTORY  Chief Complaint Leg Swelling    HPI TRUSTYN EWERT is a 73 y.o. male close past medical history presents the ER for evaluation of leg swelling and pain.  States the he has been having discomfort there for the past week or so.  States that swelling got worse after he was mowing the lawn over a week ago.  Denies any fevers.  Denies any chest pain or shortness of breath.  Does not take any diuretics.  Denies any chest pressure.  Denies any history of CAD or heart failure.    Past Medical History:  Diagnosis Date  . Borderline diabetic   . GERD (gastroesophageal reflux disease)    History reviewed. No pertinent family history. Past Surgical History:  Procedure Laterality Date  . CERVICAL FUSION     Patient Active Problem List   Diagnosis Date Noted  . 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 Abnormal CT scan 01/21/2018      Prior to Admission medications   Medication Sig Start Date End Date Taking? Authorizing Provider  diphenhydrAMINE (BENADRYL) 25 mg capsule Take 1 capsule (25 mg total) by mouth every 4 (four) hours as needed. 11/27/15 11/26/16  Duanne Guess, PA-C  ibuprofen (ADVIL,MOTRIN) 600 MG tablet Take 1 tablet (600 mg total) by mouth every 6 (six) hours as needed for moderate pain. 11/27/15   Duanne Guess, PA-C  Miconazole Nitrate (LOTRIMIN AF POWDER) 2 % AERP Apply 1 application topically 2 (two) times daily. 11/24/18   Merlyn Lot, MD  omeprazole (PRILOSEC) 20 MG capsule Take 20 mg by mouth daily.    [provider]  pravastatin (PRAVACHOL) 20 MG tablet Take 20 mg by mouth daily.    [provider]  traMADol (ULTRAM) 50 MG tablet Take 1 tablet  (50 mg total) by mouth every 6 (six) hours as needed. 07/04/18   Caryn Section Linden Dolin, PA-C    Allergies Penicillins    Social History Social History   Tobacco Use  . Smoking status: Former Research scientist (life sciences)  . Smokeless tobacco: Never Used  Substance Use Topics  . Alcohol use: No  . Drug use: Never    Review of Systems Patient denies headaches, rhinorrhea, blurry vision, numbness, shortness of breath, chest pain, edema, cough, abdominal pain, nausea, vomiting, diarrhea, dysuria, fevers, rashes or hallucinations unless otherwise stated above in HPI. ____________________________________________   PHYSICAL EXAM:  VITAL SIGNS: Vitals:   11/24/18 1545 11/24/18 1600  BP: 133/80 136/78  Pulse: (!) 48 (!) 53  Resp: 14   Temp:    SpO2: 99% 99%    Constitutional: Alert and oriented.  Eyes: Conjunctivae are normal.  Head: Atraumatic. Nose: No congestion/rhinnorhea. Mouth/Throat: Mucous membranes are moist.   Neck: No stridor. Painless ROM.  Cardiovascular: Normal rate, regular rhythm. Grossly normal heart sounds.  Good peripheral circulation. Respiratory: Normal respiratory effort.  No retractions. Lungs CTAB. Gastrointestinal: Soft and nontender. No distention. No abdominal bruits. No CVA tenderness. Genitourinary:  Musculoskeletal: No lower extremity tenderness nor edema.  2+ BLE edema Neurologic:  Normal speech and language. No gross focal neurologic deficits are appreciated. No facial droop Skin:  Skin is warm, dry and intact. No rash noted. Psychiatric: Mood and  affect are normal. Speech and behavior are normal.  ____________________________________________   LABS (all labs ordered are listed, but only abnormal results are displayed)  Results for orders placed or performed during the hospital encounter of 11/24/18 (from the past 24 hour(s))  CBC with Differential/Platelet     Status: Abnormal   Collection Time: 11/24/18  3:02 PM  Result Value Ref Range   WBC 4.8 4.0 - 10.5 K/uL    RBC 3.41 (L) 4.22 - 5.81 MIL/uL   Hemoglobin 10.6 (L) 13.0 - 17.0 g/dL   HCT 32.1 (L) 39.0 - 52.0 %   MCV 94.1 80.0 - 100.0 fL   MCH 31.1 26.0 - 34.0 pg   MCHC 33.0 30.0 - 36.0 g/dL   RDW 12.7 11.5 - 15.5 %   Platelets 192 150 - 400 K/uL   nRBC 0.0 0.0 - 0.2 %   Neutrophils Relative % 51 %   Neutro Abs 2.4 1.7 - 7.7 K/uL   Lymphocytes Relative 27 %   Lymphs Abs 1.3 0.7 - 4.0 K/uL   Monocytes Relative 11 %   Monocytes Absolute 0.5 0.1 - 1.0 K/uL   Eosinophils Relative 10 %   Eosinophils Absolute 0.5 0.0 - 0.5 K/uL   Basophils Relative 1 %   Basophils Absolute 0.1 0.0 - 0.1 K/uL   Immature Granulocytes 0 %   Abs Immature Granulocytes 0.01 0.00 - 0.07 K/uL  Comprehensive metabolic panel     Status: Abnormal   Collection Time: 11/24/18  3:02 PM  Result Value Ref Range   Sodium 139 135 - 145 mmol/L   Potassium 3.7 3.5 - 5.1 mmol/L   Chloride 107 98 - 111 mmol/L   CO2 26 22 - 32 mmol/L   Glucose, Bld 94 70 - 99 mg/dL   BUN 15 8 - 23 mg/dL   Creatinine, Ser 0.80 0.61 - 1.24 mg/dL   Calcium 8.6 (L) 8.9 - 10.3 mg/dL   Total Protein 6.7 6.5 - 8.1 g/dL   Albumin 3.5 3.5 - 5.0 g/dL   AST 25 15 - 41 U/L   ALT 22 0 - 44 U/L   Alkaline Phosphatase 59 38 - 126 U/L   Total Bilirubin 0.7 0.3 - 1.2 mg/dL   GFR calc non Af Amer >60 >60 mL/min   GFR calc Af Amer >60 >60 mL/min   Anion gap 6 5 - 15  Brain natriuretic peptide     Status: None   Collection Time: 11/24/18  3:02 PM  Result Value Ref Range   B Natriuretic Peptide 27.0 0.0 - 100.0 pg/mL   ____________________________________________  EKG My review and personal interpretation at Time: 14:50   Indication: swelling  Rate: 60  Rhythm: sinus Axis: normal Other: cno stemi, nonspecific st abn ____________________________________________  RADIOLOGY  I personally reviewed all radiographic images ordered to evaluate for the above acute complaints and reviewed radiology reports and findings.  These findings were personally  discussed with the patient.  Please see medical record for radiology report.  ____________________________________________   PROCEDURES  Procedure(s) performed:  Procedures    Critical Care performed: no ____________________________________________   INITIAL IMPRESSION / ASSESSMENT AND PLAN / ED COURSE  Pertinent labs & imaging results that were available during my care of the patient were reviewed by me and considered in my medical decision making (see chart for details).   DDX: DVT, edema, anasarca, cellulitis, tinea, electrolyte abnormality  YESENIA MANTEGNA is a 73 y.o. who presents to the ED with symptoms as  described above.  Patient nontoxic-appearing no acute distress.  Does have bilateral lower extremity swelling.  Blood work and imaging will be sent to the above differential.  Clinical Course as of Nov 23 1708  Sat Nov 24, 2018  1702 Blood work is reassuring.  Patient denies any chest pain or shortness of breath.  Does have changes in his feet consistent with tinea pedis.  He has good strong pulses.  There is no cellulitic changes.  At this point do believe he stable and appropriate for outpatient follow-up.   [PR]    Clinical Course User Index [PR] Merlyn Lot, MD    The patient was evaluated in Emergency Department today for the symptoms described in the history of present illness. He/she was evaluated in the context of the global COVID-19 pandemic, which necessitated consideration that the patient might be at risk for infection with the SARS-CoV-2 virus that causes COVID-19. Institutional protocols and algorithms that pertain to the evaluation of patients at risk for COVID-19 are in a state of rapid change based on information released by regulatory bodies including the CDC and federal and state organizations. These policies and algorithms were followed during the patient's care in the ED.  As part of my medical decision making, I reviewed the following data within  the [place] notes reviewed and incorporated, Labs reviewed, notes from prior ED visits and [place] Controlled Substance Database   ____________________________________________   FINAL CLINICAL IMPRESSION(S) / ED DIAGNOSES  Final diagnoses:  Leg swelling      NEW MEDICATIONS STARTED DURING THIS VISIT:  New Prescriptions   MICONAZOLE NITRATE (LOTRIMIN AF POWDER) 2 % AERP    Apply 1 application topically 2 (two) times daily.     Note:  This document was prepared using Dragon voice recognition software and may include unintentional dictation errors.    Merlyn Lot, MD 11/24/18 1710

## 2018-11-24 NOTE — ED Triage Notes (Signed)
Pt presents via POV c/o bilateral leg swelling with pain. Pt also reports having arthritic pain in arms for 1 month.

## 2018-11-24 NOTE — ED Notes (Signed)
Ultrasound complete 

## 2018-11-30 ENCOUNTER — Encounter: Payer: Self-pay | Admitting: Emergency Medicine

## 2018-11-30 ENCOUNTER — Emergency Department: Payer: Medicare Other

## 2018-11-30 ENCOUNTER — Other Ambulatory Visit: Payer: Self-pay

## 2018-11-30 DIAGNOSIS — Z87891 Personal history of nicotine dependence: Secondary | ICD-10-CM | POA: Insufficient documentation

## 2018-11-30 DIAGNOSIS — R600 Localized edema: Secondary | ICD-10-CM | POA: Insufficient documentation

## 2018-11-30 DIAGNOSIS — R0789 Other chest pain: Secondary | ICD-10-CM | POA: Diagnosis present

## 2018-11-30 LAB — BASIC METABOLIC PANEL
Anion gap: 7 (ref 5–15)
BUN: 20 mg/dL (ref 8–23)
CO2: 26 mmol/L (ref 22–32)
Calcium: 8.9 mg/dL (ref 8.9–10.3)
Chloride: 109 mmol/L (ref 98–111)
Creatinine, Ser: 1.01 mg/dL (ref 0.61–1.24)
GFR calc Af Amer: >60 mL/min (ref >60)
GFR calc non Af Amer: >60 mL/min (ref >60)
Glucose, Bld: 108 mg/dL — ABNORMAL HIGH (ref 70–99)
Potassium: 3.7 mmol/L (ref 3.5–5.1)
Sodium: 142 mmol/L (ref 135–145)

## 2018-11-30 LAB — CBC
HCT: 33.5 % — ABNORMAL LOW (ref 39.0–52.0)
Hemoglobin: 11.1 g/dL — ABNORMAL LOW (ref 13.0–17.0)
MCH: 31.1 pg (ref 26.0–34.0)
MCHC: 33.1 g/dL (ref 30.0–36.0)
MCV: 93.8 fL (ref 80.0–100.0)
Platelets: 200 10*3/uL (ref 150–400)
RBC: 3.57 MIL/uL — ABNORMAL LOW (ref 4.22–5.81)
RDW: 12.9 % (ref 11.5–15.5)
WBC: 6.5 10*3/uL (ref 4.0–10.5)
nRBC: 0 % (ref 0.0–0.2)

## 2018-11-30 LAB — TROPONIN I (HIGH SENSITIVITY): Troponin I (High Sensitivity): 5 ng/L (ref <18)

## 2018-11-30 NOTE — ED Triage Notes (Signed)
Patient with complaint of sharpe left side chest pain that started this morning with shortness of breath. Patient states that he has has bilateral leg swelling times one week.

## 2018-12-01 ENCOUNTER — Emergency Department
Admission: EM | Admit: 2018-12-01 | Discharge: 2018-12-01 | Disposition: A | Discharge status: Home / Self Care | Payer: Medicare Other | Attending: Emergency Medicine | Admitting: Emergency Medicine

## 2018-12-01 ENCOUNTER — Emergency Department: Payer: Medicare Other

## 2018-12-01 DIAGNOSIS — R609 Edema, unspecified: Secondary | ICD-10-CM

## 2018-12-01 DIAGNOSIS — R079 Chest pain, unspecified: Secondary | ICD-10-CM

## 2018-12-01 LAB — TROPONIN I (HIGH SENSITIVITY): Troponin I (High Sensitivity): 5 ng/L (ref <18)

## 2018-12-01 LAB — BRAIN NATRIURETIC PEPTIDE: B Natriuretic Peptide: 28 pg/mL (ref 0.0–100.0)

## 2018-12-01 MED ORDER — FUROSEMIDE 20 MG PO TABS: 20.0000 mg | ORAL_TABLET | Freq: Every day | ORAL | 0 refills | Status: DC

## 2018-12-01 MED ORDER — IOHEXOL 350 MG/ML SOLN
75.0000 mL | Freq: Once | INTRAVENOUS | Status: AC | PRN
  Administered 2018-12-01: 02:00:00 75 mL via INTRAVENOUS

## 2018-12-01 NOTE — ED Notes (Signed)
Reviewed discharge instructions, follow-up care, and prescriptions with patient. Patient verbalized understanding of all information reviewed. Patient stable, with no distress noted at this time.    

## 2018-12-01 NOTE — Discharge Instructions (Signed)
1.  Take fluid pill as prescribed (Lasix 20 mg daily x3 days). 2.  Wear compressive stockings whenever possible. 3.  Elevate legs whenever possible to reduce swelling. 4.  Return to the ER for worsening symptoms, persistent vomiting, difficulty breathing or other concerns.

## 2018-12-01 NOTE — ED Provider Notes (Signed)
[facility] Emergency Department Provider Note   ____________________________________________   First MD Initiated Contact with Patient 12/01/18 0122     (approximate)  I have reviewed the triage vital signs and the nursing notes.   HISTORY  Chief Complaint Chest Pain and Leg Swelling    HPI Frank Buckley is a 73 y.o. male who presents to the ED from home with a chief complaint of chest pain and bilateral lower leg swelling.  Patient was seen approximately 1 week ago for peripheral edema thought to be secondary to fungal infection.  Last evening he had a brief period of time where he felt sharp pain to his left side worsened with deep breathing and shortness of breath.  States leg swelling has worsened over the past week.  Denies fever, cough, abdominal pain, nausea, vomiting, diaphoresis.  Denies recent travel, trauma or hormone use.       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 Authorizing Provider  diphenhydrAMINE (BENADRYL) 25 mg capsule Take 1 capsule (25 mg total) by mouth every 4 (four) hours as needed. 11/27/15 11/26/16  Duanne Guess, PA-C  furosemide (LASIX) 20 MG tablet Take 1 tablet (20 mg total) by mouth daily for 3 days. 12/01/18 12/04/18  Paulette Blanch, MD  ibuprofen (ADVIL,MOTRIN) 600 MG tablet Take 1 tablet (600 mg total) by mouth every 6 (six) hours as needed for moderate pain. 11/27/15   Duanne Guess, PA-C  Miconazole Nitrate (LOTRIMIN AF POWDER) 2 % AERP Apply 1 application topically 2 (two) times daily. 11/24/18    Merlyn Lot, MD  omeprazole (PRILOSEC) 20 MG capsule Take 20 mg by mouth daily.    [provider]  pravastatin (PRAVACHOL) 20 MG tablet Take 20 mg by mouth daily.    [provider]  traMADol (ULTRAM) 50 MG tablet Take 1 tablet (50 mg total) by mouth every 6 (six) hours as needed. 07/04/18   Versie Starks, PA-C    Allergies Penicillins  No family history on file.  Social History Social History   Tobacco Use   Smoking status: Former Smoker   Smokeless tobacco: Never Used  Substance Use Topics   Alcohol use: No   Drug use: Never    Review of Systems  Constitutional: No fever/chills Eyes: No visual changes. ENT: No sore throat. Cardiovascular: Positive for chest pain. Respiratory: Positive for shortness of breath. Gastrointestinal: No abdominal pain.  No nausea, no vomiting.  No diarrhea.  No constipation. Genitourinary: Negative for dysuria. Musculoskeletal: Positive for BLE swelling.  Negative for back pain. Skin: Negative for rash. Neurological: Negative for headaches, focal weakness or numbness.   ____________________________________________   PHYSICAL EXAM:  VITAL SIGNS: ED Triage Vitals  Enc Vitals Group     BP 11/30/18 1948 114/66     Pulse Rate 11/30/18 1948 67     Resp 11/30/18 1948 18     Temp 11/30/18 1948 98.2 F (36.8 C)     Temp Source 11/30/18 1948 Oral     SpO2  11/30/18 1948 96 %     Weight 11/30/18 1949 175 lb (79.4 kg)     Height 11/30/18 1949 5\' 9"  (1.753 m)     Head Circumference --      Peak Flow --      Pain Score 11/30/18 1949 0     Pain Loc --      Pain Edu? --      Excl. in [place]? --     Constitutional: Alert and oriented. Well appearing and in no acute distress. Eyes: Conjunctivae are normal. PERRL. EOMI. Head: Atraumatic. Nose: No congestion/rhinnorhea. Mouth/Throat: Mucous membranes are moist.  Oropharynx non-erythematous. Neck: No stridor.   Cardiovascular: Normal rate, regular rhythm. Grossly  normal heart sounds.  Good peripheral circulation. Respiratory: Normal respiratory effort.  No retractions. Lungs CTAB. Gastrointestinal: Soft and nontender to light or deep palpation. No distention. No abdominal bruits. No CVA tenderness. Musculoskeletal: No lower extremity tenderness. 1+ BLE pitting edema.  No joint effusions. Neurologic:  Normal speech and language. No gross focal neurologic deficits are appreciated. No gait instability. Skin:  Skin is warm, dry and intact. No rash noted. Psychiatric: Mood and affect are normal. Speech and behavior are normal.  ____________________________________________   LABS (all labs ordered are listed, but only abnormal results are displayed)  Labs Reviewed  BASIC METABOLIC PANEL - Abnormal; Notable for the following components:      Result Value   Glucose, Bld 108 (*)    All other components within normal limits  CBC - Abnormal; Notable for the following components:   RBC 3.57 (*)    Hemoglobin 11.1 (*)    HCT 33.5 (*)    All other components within normal limits  BRAIN NATRIURETIC PEPTIDE  TROPONIN I (HIGH SENSITIVITY)  TROPONIN I (HIGH SENSITIVITY)   ____________________________________________  EKG  ED ECG REPORT I, Shlomie Romig J, the attending physician, personally viewed and interpreted this ECG.   Date: 12/01/2018  EKG Time: 1947  Rate: 62  Rhythm: normal EKG, normal sinus rhythm  Axis: Normal  Intervals:none  ST&T Change: Nonspecific  ____________________________________________  RADIOLOGY  ED MD interpretation: No acute cardiopulmonary process; no PE  Official radiology report(s): Dg Chest 2 View  Result Date: 11/30/2018 CLINICAL DATA:  Acute LEFT chest pain today. EXAM: CHEST - 2 VIEW COMPARISON:  11/24/2018 prior radiographs FINDINGS: The cardiomediastinal silhouette is unremarkable. There is no evidence of focal airspace disease, pulmonary edema, suspicious pulmonary nodule/mass, pleural effusion, or pneumothorax.  No acute bony abnormalities are identified. IMPRESSION: No active cardiopulmonary disease. Electronically Signed   By: Margarette Canada M.D.   On: 11/30/2018 20:14   Ct Angio Chest Pe W/cm &/or Wo Cm  Result Date: 12/01/2018 CLINICAL DATA:  Chest pain and lower extremity swelling EXAM: CT ANGIOGRAPHY CHEST WITH CONTRAST TECHNIQUE: Multidetector CT imaging of the chest was performed using the standard protocol during bolus administration of intravenous contrast. Multiplanar CT image reconstructions and MIPs were obtained to evaluate the vascular anatomy. CONTRAST:  3mL OMNIPAQUE IOHEXOL 350 MG/ML SOLN COMPARISON:  None. FINDINGS: Cardiovascular: --Pulmonary arteries: Contrast injection is sufficient to demonstrate satisfactory opacification of the pulmonary arteries to the segmental level, with attenuation of at least 200 HU at the main pulmonary artery. There is no pulmonary embolus. The main pulmonary artery is within normal limits for size. --Aorta: Limited opacification of the aorta due to bolus timing optimization for the pulmonary arteries. The course and caliber of the aorta are normal. Conventional 3 vessel aortic branching pattern. There is no aortic  atherosclerosis. --Heart: Normal size. No pericardial effusion. Mediastinum/Nodes: No mediastinal, hilar or axillary lymphadenopathy. The visualized thyroid and thoracic esophageal course are unremarkable. Lungs/Pleura: There is a right upper lobe nodule measuring 11 x 9 mm and a right middle lobe nodule measuring 8 x 10 mm. Upper Abdomen: Contrast bolus timing is not optimized for evaluation of the abdominal organs. The visualized portions of the organs of the upper abdomen are normal. Musculoskeletal: No chest wall abnormality. No bony spinal canal stenosis. Review of the MIP images confirms the above findings. IMPRESSION: 1. No pulmonary embolus or acute aortic syndrome. 2. Right upper and middle lobe pulmonary nodules, the largest of which measures up to 11  mm. Non-contrast chest CT at 3-6 months is recommended. If the nodules are stable at time of repeat CT, then future CT at 18-24 months (from today's scan) is considered optional for low-risk patients, but is recommended for high-risk patients. This recommendation follows the consensus statement: Guidelines for Management of Incidental Pulmonary Nodules Detected on CT Images: From the Fleischner Society 2017; Radiology 2017; 284:228-243. Electronically Signed   By: Ulyses Jarred M.D.   On: 12/01/2018 02:32    ____________________________________________   PROCEDURES  Procedure(s) performed (including Critical Care):  Procedures   ____________________________________________   INITIAL IMPRESSION / ASSESSMENT AND PLAN / ED COURSE  As part of my medical decision making, I reviewed the following data within the [place] notes reviewed and incorporated, Labs reviewed, EKG interpreted, Old chart reviewed, Radiograph reviewed and Notes from prior ED visits     Frank Buckley was evaluated in Emergency Department on 12/01/2018 for the symptoms described in the history of present illness. He was evaluated in the context of the global COVID-19 pandemic, which necessitated consideration that the patient might be at risk for infection with the SARS-CoV-2 virus that causes COVID-19. Institutional protocols and algorithms that pertain to the evaluation of patients at risk for COVID-19 are in a state of rapid change based on information released by regulatory bodies including the CDC and federal and state organizations. These policies and algorithms were followed during the patient's care in the ED.   73 year old male who presents with chest pain, shortness of breath and lower extremity edema.  Differential diagnosis includes, but is not limited to, ACS, aortic dissection, pulmonary embolism, cardiac tamponade, pneumothorax, pneumonia, pericarditis, myocarditis, GI-related causes  including esophagitis/gastritis, and musculoskeletal chest wall pain.    No documented history of CAD or CHF although he mentions in the 1980s he was diagnosed with "silent heart attack" via EKG.  Initial troponin and EKG unremarkable.  Will repeat troponin, check BMP, CT chest to evaluate for PE.  Patient currently voices no complaints.  Will reassess.   Clinical Course as of Dec 01 623  Sat Dec 01, 2018  0350 Patient resting in no acute distress.  No complaints of pain.  Updated him of repeat troponin, BMP and CT results.  Will discharge home with 3-day course of Lasix for peripheral edema.  Refer to cardiology for outpatient work-up.  We discussed his incidental pulmonary nodules on CT scan; he will follow-up with his PCP.  Strict return precautions given.  Patient verbalizes understanding agrees with plan of care.   [JS]    Clinical Course User Index [JS] Paulette Blanch, MD     ____________________________________________   FINAL CLINICAL IMPRESSION(S) / ED DIAGNOSES  Final diagnoses:  Peripheral edema  Chest pain, unspecified type     ED Discharge Orders  Ordered    furosemide (LASIX) 20 MG tablet  Daily     12/01/18 0352           Note:  This document was prepared using Dragon voice recognition software and may include unintentional dictation errors.   Paulette Blanch, MD 12/01/18 [phone]

## 2018-12-01 NOTE — ED Notes (Signed)
Patient c/o intermittent left chest pain (no pain currently) and SOB. Patient describes pain as sharp. Patient c/o bilateral lower extremity edema. + 1 pitting seen to bilateral lower extremities.

## 2018-12-01 NOTE — ED Notes (Signed)
ED Provider at bedside. 

## 2018-12-01 NOTE — ED Notes (Signed)
Patient transported to CT 

## 2018-12-11 ENCOUNTER — Ambulatory Visit: Payer: Medicare Other | Admitting: Urology

## 2019-01-07 NOTE — Progress Notes (Deleted)
01/08/2019 8:06 AM   Frank Buckley [birth year] [id number]  Referring provider: Center, [facility] [address] [place],  [place] [address]  No chief complaint on file.   HPI: 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  History of hematuria (high risk) Former smoker.  CTU in 12/2017 revealed possible left nephrolithiasis. No other explanation for hematuria.  Aortic Atherosclerosis (ICD10-I70.0).  Right hepatic lobe hemangioma.  Heterogeneous density throughout the pelvis. Although this could relate to aggressive osteoporosis, recommend clinical correlation for multiple myeloma.  Cystoscopy with Dr. Bernardo Heater in 12/2017 revealed moderate lateral lobe enlargement with hypervascularity prostatic urethra.  Mild elevation bladder neck.  Urine cytology was negative.  He does not report any gross hematuria.  His UA is ***.    PMH: Past Medical History:  Diagnosis Date  . Borderline diabetic   . GERD (gastroesophageal reflux disease)     Surgical History: Past Surgical History:  Procedure Laterality Date  . CERVICAL FUSION      Home Medications:  Allergies as of 01/08/2019      Reactions   Penicillins Rash      Medication List       Accurate as of January 07, 2019  8:06 AM. If you have any questions, ask your nurse or doctor.        diphenhydrAMINE 25 mg capsule Commonly known as: BENADRYL Take 1 capsule (25 mg total) by mouth every 4 (four) hours as needed.   furosemide 20 MG tablet Commonly known as: LASIX Take 1 tablet (20 mg total) by mouth daily for 3 days.   ibuprofen 600 MG tablet Commonly known as: ADVIL Take 1 tablet (600 mg total) by mouth every 6 (six) hours as needed for moderate pain.   Lotrimin AF Powder 2 % Aerp Generic drug: Miconazole Nitrate Apply 1 application topically 2 (two) times daily.   omeprazole 20 MG capsule Commonly known as: PRILOSEC Take 20 mg by mouth  daily.   pravastatin 20 MG tablet Commonly known as: PRAVACHOL Take 20 mg by mouth daily.   traMADol 50 MG tablet Commonly known as: ULTRAM Take 1 tablet (50 mg total) by mouth every 6 (six) hours as needed.       Allergies:  Allergies  Allergen Reactions  . Penicillins Rash    Family History: No family history on file.  Social History:  reports that he has quit smoking. He has never used smokeless tobacco. He reports that he does not drink alcohol or use drugs.  ROS:                                        Physical Exam: There were no vitals taken for this visit.  Constitutional:  Well nourished. Alert and oriented, No acute distress. HEENT: [place] AT, moist mucus membranes.  Trachea midline, no masses. Cardiovascular: No clubbing, cyanosis, or edema. Respiratory: Normal respiratory effort, no increased work of breathing. GI: Abdomen is soft, non tender, non distended, no abdominal masses. Liver and spleen not palpable.  No hernias appreciated.  Stool sample for occult testing is not indicated.   GU: No CVA tenderness.  No bladder fullness or masses.  Patient with circumcised/uncircumcised phallus. ***Foreskin easily retracted***  Urethral meatus is patent.  No penile discharge. No penile lesions or rashes. Scrotum without lesions, cysts,  rashes and/or edema.  Testicles are located scrotally bilaterally. No masses are appreciated in the testicles. Left and right epididymis are normal. Rectal: Patient with  normal sphincter tone. Anus and perineum without scarring or rashes. No rectal masses are appreciated. Prostate is approximately *** grams, *** nodules are appreciated. Seminal vesicles are normal. Skin: No rashes, bruises or suspicious lesions. Lymph: No cervical or inguinal adenopathy. Neurologic: Grossly intact, no focal deficits, moving all 4 extremities. Psychiatric: Normal mood and affect.  Laboratory Data: Lab Results  Component Value Date    WBC 6.5 11/30/2018   HGB 11.1 (L) 11/30/2018   HCT 33.5 (L) 11/30/2018   MCV 93.8 11/30/2018   PLT 200 11/30/2018    Lab Results  Component Value Date   CREATININE 1.01 11/30/2018    No results found for: PSA  No results found for: TESTOSTERONE  No results found for: HGBA1C  No results found for: TSH  No results found for: CHOL, HDL, CHOLHDL, VLDL, LDLCALC  Lab Results  Component Value Date   AST 25 11/24/2018   Lab Results  Component Value Date   ALT 22 11/24/2018   No components found for: ALKALINEPHOPHATASE No components found for: BILIRUBINTOTAL  No results found for: ESTRADIOL   Urinalysis ***  I have reviewed the labs.   Assessment & Plan:   1. History of hematuria (high risk) Hematuria work up completed in 12/2017 - findings positive for moderate lateral lobe enlargement with hypervascularity prostatic urethra.  Mild elevation bladder neck No report of gross hematuria *** UA today *** RTC in one year for UA - patient to report any gross hematuria in the interim    2. Osseous lesions Followed by oncology.     No follow-ups on file.  These notes generated with voice recognition software. I apologize for typographical errors.  Zara Council, PA-C  [facility] [address] [place]  [place], [place] [address] [phone]

## 2019-01-08 ENCOUNTER — Ambulatory Visit: Payer: Medicare Other | Admitting: Urology

## 2019-01-08 ENCOUNTER — Encounter: Payer: Self-pay | Admitting: Urology

## 2019-01-09 ENCOUNTER — Telehealth: Payer: Self-pay | Admitting: Oncology

## 2019-01-09 NOTE — Telephone Encounter (Signed)
I have left VM x 3 to get pt appt rescheduled. Also contacted brother with no answer a VM left to call office to get appt r\s.

## 2019-01-11 ENCOUNTER — Telehealth: Payer: Self-pay | Admitting: Oncology

## 2019-01-11 NOTE — Telephone Encounter (Signed)
Multiple attempts have been made to reach pt to r\s appt for 10/12 due to [facility] being out of office. I have attempted to contact pt and his brother Frank Buckley many times to speak with them about this appt. I have sent emails also as pt does not have Mychart activated. Appt letter has been printed to be mailed with r\s appt. [place] has been made aware of my attempts to reach patient.

## 2019-01-14 ENCOUNTER — Inpatient Hospital Stay: Payer: Medicare Other | Admitting: Oncology

## 2019-01-24 NOTE — Progress Notes (Deleted)
Called patient no answer mailbox is full.

## 2019-01-24 NOTE — Progress Notes (Deleted)
[facility]  [phone] [phone]) [phone]  ID: ELKIN BELFIELD OB: 1945-08-30  MR#: [id number]  RAQ#:[id number]  Patient Care Team: Marguerita Merles, MD as PCP - General (Family Medicine)  CHIEF COMPLAINT: MGUS  INTERVAL HISTORY: Patient returns to clinic today to discuss his metastatic bone survey results and scheduling a bone marrow biopsy.  He continues to feel well and is at his baseline. He has no neurologic complaints.  He denies any recent fevers or illnesses.  He has a good appetite and denies weight loss.  He does not complain of pain today.  He has no chest pain or shortness of breath.  He denies any nausea, vomiting, constipation, or diarrhea.  He has no urinary complaints.  Patient offers no specific complaints today.  REVIEW OF SYSTEMS:   Review of Systems  Constitutional: Negative.  Negative for fever, malaise/fatigue and weight loss.  Respiratory: Negative.  Negative for cough, hemoptysis and shortness of breath.   Cardiovascular: Negative.  Negative for chest pain and leg swelling.  Gastrointestinal: Negative.  Negative for abdominal pain.  Genitourinary: Negative.  Negative for hematuria.  Musculoskeletal: Negative.  Negative for back pain and joint pain.  Skin: Negative.  Negative for rash.  Neurological: Negative.  Negative for focal weakness, weakness and headaches.  Psychiatric/Behavioral: Negative.  The patient is not nervous/anxious.     As per HPI. Otherwise, a complete review of systems is negative.  PAST MEDICAL HISTORY: Past Medical History:  Diagnosis Date  . Borderline diabetic   . GERD (gastroesophageal reflux disease)     PAST SURGICAL HISTORY: Past Surgical History:  Procedure Laterality Date  . CERVICAL FUSION      FAMILY HISTORY: No family history on file.  ADVANCED DIRECTIVES (Y/N):  N  HEALTH MAINTENANCE: Social History   Tobacco Use  . Smoking status: Former Research scientist (life sciences)  . Smokeless tobacco: Never Used   Substance Use Topics  . Alcohol use: No  . Drug use: Never     Colonoscopy:  PAP:  Bone density:  Lipid panel:  Allergies  Allergen Reactions  . Penicillins Rash    Current Outpatient Medications  Medication Sig Dispense Refill  . diphenhydrAMINE (BENADRYL) 25 mg capsule Take 1 capsule (25 mg total) by mouth every 4 (four) hours as needed. 30 capsule 0  . furosemide (LASIX) 20 MG tablet Take 1 tablet (20 mg total) by mouth daily for 3 days. 3 tablet 0  . ibuprofen (ADVIL,MOTRIN) 600 MG tablet Take 1 tablet (600 mg total) by mouth every 6 (six) hours as needed for moderate pain. 30 tablet 0  . Miconazole Nitrate (LOTRIMIN AF POWDER) 2 % AERP Apply 1 application topically 2 (two) times daily. 85 g 0  . omeprazole (PRILOSEC) 20 MG capsule Take 20 mg by mouth daily.    . pravastatin (PRAVACHOL) 20 MG tablet Take 20 mg by mouth daily.    . traMADol (ULTRAM) 50 MG tablet Take 1 tablet (50 mg total) by mouth every 6 (six) hours as needed. 15 tablet 0   No current facility-administered medications for this visit.     OBJECTIVE: There were no vitals filed for this visit.   There is no height or weight on file to calculate BMI.    ECOG FS:0 - Asymptomatic  General: Well-developed, well-nourished, no acute distress. Eyes: Pink conjunctiva, anicteric sclera. HEENT: Normocephalic, moist mucous membranes, clear oropharnyx. Lungs: Clear to auscultation bilaterally. Heart: Regular rate and rhythm. No rubs, murmurs, or gallops. Abdomen: Soft, nontender, nondistended.  No organomegaly noted, normoactive bowel sounds. Musculoskeletal: No edema, cyanosis, or clubbing. Neuro: Alert, answering all questions appropriately. Cranial nerves grossly intact. Skin: No rashes or petechiae noted. Psych: Normal affect. Lymphatics: No cervical, calvicular, axillary or inguinal LAD.  LAB RESULTS:  Lab Results  Component Value Date   NA 142 11/30/2018   K 3.7 11/30/2018   CL 109 11/30/2018   CO2 26  11/30/2018   GLUCOSE 108 (H) 11/30/2018   BUN 20 11/30/2018   CREATININE 1.01 11/30/2018   CALCIUM 8.9 11/30/2018   PROT 6.7 11/24/2018   ALBUMIN 3.5 11/24/2018   AST 25 11/24/2018   ALT 22 11/24/2018   ALKPHOS 59 11/24/2018   BILITOT 0.7 11/24/2018   GFRNONAA >60 11/30/2018   GFRAA >60 11/30/2018    Lab Results  Component Value Date   WBC 6.5 11/30/2018   NEUTROABS 2.4 11/24/2018   HGB 11.1 (L) 11/30/2018   HCT 33.5 (L) 11/30/2018   MCV 93.8 11/30/2018   PLT 200 11/30/2018   Lab Results  Component Value Date   TOTALPROTELP 7.6 05/02/2018   ALBUMINELP 4.0 05/02/2018   A1GS 0.2 05/02/2018   A2GS 1.0 05/02/2018   BETS 0.8 05/02/2018   GAMS 1.6 05/02/2018   MSPIKE 0.5 (H) 05/02/2018   SPEI Comment 05/02/2018     STUDIES: No results found.  ASSESSMENT: MGUS.  PLAN:    1. MGUS: Bone marrow biopsy completed on March 13, 2018 revealed a slightly hypercellular bone marrow with trilineage hematopoiesis.  Patient had 5% plasma cells.  Cytogenetic studies were normal.  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  He has a mild M spike of 0.6 with a mild IgM predominance of 237.  His kappa/lambda free light chain ratio is only mildly elevated at 1.86.  He has no evidence of endorgan damage with a normal creatinine, calcium level, and CBC.  His PSA is within normal limits.  No intervention is needed at this time.  Repeat laboratory work in 6 months. 2.  Widespread osseous lesions: Given results of bone marrow biopsy and a normal PSA, this is not myeloma or prostate cancer.  Will continue work-up to determine an etiology therefore will order CT of the chest, abdomen, and pelvis for further evaluation.  Patient will return to clinic in 2 to 3 weeks after his imaging to discuss the results.    I spent a total of 30 minutes face-to-face with the patient of which greater than 50% of the visit was spent in counseling and coordination of care as  detailed above.   Patient expressed understanding and was in agreement with this plan. He also understands that He can call clinic at any time with any questions, concerns, or complaints.    Lloyd Huger, MD   01/24/2019 11:42 PM

## 2019-01-25 ENCOUNTER — Inpatient Hospital Stay: Payer: Medicare Other | Admitting: Oncology

## 2019-02-07 ENCOUNTER — Ambulatory Visit: Payer: Medicare Other

## 2019-02-07 ENCOUNTER — Ambulatory Visit: Payer: Medicare Other | Admitting: Oncology

## 2019-02-07 ENCOUNTER — Other Ambulatory Visit: Payer: Medicare Other

## 2019-02-19 ENCOUNTER — Other Ambulatory Visit: Payer: Medicare Other

## 2019-02-20 ENCOUNTER — Encounter: Payer: Self-pay | Admitting: Urology

## 2019-02-28 ENCOUNTER — Emergency Department: Payer: Medicare Other

## 2019-02-28 ENCOUNTER — Emergency Department
Admission: EM | Admit: 2019-02-28 | Discharge: 2019-02-28 | Disposition: A | Discharge status: Home / Self Care | Payer: Medicare Other | Attending: Emergency Medicine | Admitting: Emergency Medicine

## 2019-02-28 ENCOUNTER — Other Ambulatory Visit: Payer: Self-pay

## 2019-02-28 ENCOUNTER — Encounter: Payer: Self-pay | Admitting: Emergency Medicine

## 2019-02-28 DIAGNOSIS — Z79899 Other long term (current) drug therapy: Secondary | ICD-10-CM | POA: Insufficient documentation

## 2019-02-28 DIAGNOSIS — R0789 Other chest pain: Secondary | ICD-10-CM | POA: Diagnosis present

## 2019-02-28 DIAGNOSIS — R911 Solitary pulmonary nodule: Secondary | ICD-10-CM | POA: Diagnosis not present

## 2019-02-28 DIAGNOSIS — Z87891 Personal history of nicotine dependence: Secondary | ICD-10-CM | POA: Insufficient documentation

## 2019-02-28 LAB — BASIC METABOLIC PANEL
Anion gap: 9 (ref 5–15)
BUN: 19 mg/dL (ref 8–23)
CO2: 23 mmol/L (ref 22–32)
Calcium: 9 mg/dL (ref 8.9–10.3)
Chloride: 104 mmol/L (ref 98–111)
Creatinine, Ser: 0.98 mg/dL (ref 0.61–1.24)
GFR calc Af Amer: >60 mL/min (ref >60)
GFR calc non Af Amer: >60 mL/min (ref >60)
Glucose, Bld: 100 mg/dL — ABNORMAL HIGH (ref 70–99)
Potassium: 3.9 mmol/L (ref 3.5–5.1)
Sodium: 136 mmol/L (ref 135–145)

## 2019-02-28 LAB — CBC
HCT: 34.8 % — ABNORMAL LOW (ref 39.0–52.0)
Hemoglobin: 11.4 g/dL — ABNORMAL LOW (ref 13.0–17.0)
MCH: 30.5 pg (ref 26.0–34.0)
MCHC: 32.8 g/dL (ref 30.0–36.0)
MCV: 93 fL (ref 80.0–100.0)
Platelets: 220 10*3/uL (ref 150–400)
RBC: 3.74 MIL/uL — ABNORMAL LOW (ref 4.22–5.81)
RDW: 12.4 % (ref 11.5–15.5)
WBC: 6.5 10*3/uL (ref 4.0–10.5)
nRBC: 0 % (ref 0.0–0.2)

## 2019-02-28 LAB — TROPONIN I (HIGH SENSITIVITY): Troponin I (High Sensitivity): 6 ng/L (ref <18)

## 2019-02-28 MED ORDER — SODIUM CHLORIDE 0.9% FLUSH: 3.0000 mL | Freq: Once | INTRAVENOUS | Status: DC

## 2019-02-28 MED ORDER — IOHEXOL 350 MG/ML SOLN
75.0000 mL | Freq: Once | INTRAVENOUS | Status: AC | PRN
  Administered 2019-02-28: 75 mL via INTRAVENOUS

## 2019-02-28 NOTE — Discharge Instructions (Signed)
Your CT scan today showed multiple concerning pulmonary nodules.  Please schedule follow-up with Dr. Faith Rogue of cardiothoracic surgery as well as Dr. Grayland Ormond of oncology as soon as possible for further evaluation of these.  If you have any worsening symptoms, please return to the ER for reevaluation.  You may also schedule follow-up with your primary care provider for assistance coordinating specialty care.

## 2019-02-28 NOTE — ED Notes (Signed)

## 2019-02-28 NOTE — ED Provider Notes (Signed)
[facility] Emergency Department Provider Note   ____________________________________________   First MD Initiated Contact with Patient 02/28/19 1634     (approximate)  I have reviewed the triage vital signs and the nursing notes.   HISTORY  Chief Complaint Chest Pain    HPI Frank Buckley is a 73 y.o. male with past medical history of hypertension, hyperlipidemia, and GERD who presents to the ED complaining of chest pain.  Patient reports he has had approximately 1 week of constant pain in the left side of his chest.  He describes it as sharp and exacerbated by deep breath or twisting movements of his neck or trunk.  He has not had any fevers or cough, but does complain of some mild shortness of breath.  He deals with chronic mild swelling in his lower extremities that is no worse than usual and is not associated with any pain.  He denies any history of DVT/PE but does report he has been told he has nodules in his lungs.  He denies any significant cardiac history.        Past Medical History:  Diagnosis Date  . Borderline diabetic   . GERD (gastroesophageal reflux disease)     Patient Active Problem List   Diagnosis Date Noted  . 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 Abnormal CT scan 01/21/2018    Past Surgical History:  Procedure Laterality Date  . CERVICAL FUSION      Prior to Admission medications   Medication Sig Start Date End Date Taking? Authorizing Provider  diphenhydrAMINE (BENADRYL) 25 mg capsule Take 1 capsule (25 mg total) by mouth every 4 (four) hours as needed. 11/27/15 11/26/16  Duanne Guess, PA-C  furosemide (LASIX) 20 MG tablet Take 1 tablet (20 mg total) by mouth daily for 3 days. 12/01/18 12/04/18  Paulette Blanch, MD  ibuprofen (ADVIL,MOTRIN) 600 MG tablet Take 1 tablet (600 mg total) by mouth every 6 (six) hours as needed for moderate pain. 11/27/15    Duanne Guess, PA-C  Miconazole Nitrate (LOTRIMIN AF POWDER) 2 % AERP Apply 1 application topically 2 (two) times daily. 11/24/18   Merlyn Lot, MD  omeprazole (PRILOSEC) 20 MG capsule Take 20 mg by mouth daily.    [provider]  pravastatin (PRAVACHOL) 20 MG tablet Take 20 mg by mouth daily.    [provider]  traMADol (ULTRAM) 50 MG tablet Take 1 tablet (50 mg total) by mouth every 6 (six) hours as needed. 07/04/18   Versie Starks, PA-C    Allergies Penicillins  No family history on file.  Social History Social History   Tobacco Use  . Smoking status: Former Research scientist (life sciences)  . Smokeless tobacco: Never Used  Substance Use Topics  . Alcohol use: No  . Drug use: Never    Review of Systems  Constitutional: No fever/chills Eyes: No visual changes. ENT: No sore throat. Cardiovascular: Denies chest pain. Respiratory: Denies shortness of breath. Gastrointestinal: No abdominal pain.  No nausea, no vomiting.  No diarrhea.  No constipation. Genitourinary: Negative for dysuria. Musculoskeletal: Negative for back pain. Skin: Negative for rash. Neurological: Negative for headaches, focal weakness or numbness.  ____________________________________________   PHYSICAL EXAM:  VITAL SIGNS: ED Triage Vitals  Enc Vitals Group     BP 02/28/19 1624 (!) 149/79     Pulse Rate 02/28/19 1624 66     Resp 02/28/19 1624 20  Temp 02/28/19 1624 97.9 F (36.6 C)     Temp Source 02/28/19 1624 Oral     SpO2 02/28/19 1624 99 %     Weight 02/28/19 1625 175 lb (79.4 kg)     Height 02/28/19 1625 5\' 10"  (1.778 m)     Head Circumference --      Peak Flow --      Pain Score 02/28/19 1624 7     Pain Loc --      Pain Edu? --      Excl. in [place]? --     Constitutional: Alert and oriented. Eyes: Conjunctivae are normal. Head: Atraumatic. Nose: No congestion/rhinnorhea. Mouth/Throat: Mucous membranes are moist. Neck: Normal ROM Cardiovascular: Normal rate, regular rhythm.  Grossly normal heart sounds. Respiratory: Normal respiratory effort.  No retractions. Lungs CTAB.  No chest wall tenderness. Gastrointestinal: Soft and nontender. No distention. Genitourinary: deferred Musculoskeletal: No lower extremity tenderness, trace pitting edema to bilateral lower extremities. Neurologic:  Normal speech and language. No gross focal neurologic deficits are appreciated. Skin:  Skin is warm, dry and intact. No rash noted. Psychiatric: Mood and affect are normal. Speech and behavior are normal.  ____________________________________________   LABS (all labs ordered are listed, but only abnormal results are displayed)  Labs Reviewed  BASIC METABOLIC PANEL - Abnormal; Notable for the following components:      Result Value   Glucose, Bld 100 (*)    All other components within normal limits  CBC - Abnormal; Notable for the following components:   RBC 3.74 (*)    Hemoglobin 11.4 (*)    HCT 34.8 (*)    All other components within normal limits  TROPONIN I (HIGH SENSITIVITY)  TROPONIN I (HIGH SENSITIVITY)   ____________________________________________  EKG  ED ECG REPORT I, Blake Divine, the attending physician, personally viewed and interpreted this ECG.   Date: 02/28/2019  EKG Time: 16:27  Rate: 68  Rhythm: normal sinus rhythm  Axis: Normal  Intervals:none  ST&T Change: None   PROCEDURES  Procedure(s) performed (including Critical Care):  Procedures   ____________________________________________   INITIAL IMPRESSION / ASSESSMENT AND PLAN / ED COURSE       73 year old male with history of hypertension and hyperlipidemia presents to the ED complaining of sharp left-sided chest pain exacerbated by twisting movements or with a deep breath.  EKG shows no acute ischemic changes, will check troponin but low suspicion for ACS given atypical symptoms.  He has a heart score of less than 4 and with symptoms ongoing constantly for the past week, repeat  troponin not indicated.  We will also check CTA to rule out PE given his history of multiple lung nodules with pleuritic pain.  He only complains of mild shortness of breath and is in no respiratory distress.  If work-up unremarkable, suspect musculoskeletal pain versus GERD.  CTA is negative for PE but does show concerning appearance of patient's known pulmonary nodules.  I had a long discussion with the patient emphasizing the need for him to follow-up with cardiothoracic surgery for as well as oncology for potential biopsy.  Patient states he has been trying to establish care with oncology but has not yet done so.  He was again provided with their contact information and counseled to return to the ED for new or worsening symptoms.  Remainder of work-up is unremarkable, troponin within normal limits, doubt ACS.  Counseled patient to return to the ED for new or worsening symptoms, patient agrees with plan.  ____________________________________________   FINAL CLINICAL IMPRESSION(S) / ED DIAGNOSES  Final diagnoses:  Atypical chest pain  Lung nodule     ED Discharge Orders    None       Note:  This document was prepared using Dragon voice recognition software and may include unintentional dictation errors.   Blake Divine, MD 02/28/19 2028

## 2019-02-28 NOTE — ED Triage Notes (Signed)
Pt presents to ED via POV with c/o L sided CP, pt states pain worse when he turns his head. Pt states was dx with lesions to R lung that have moved to the L lung since then. Pt states pain worse when he turns his head to the R. A&O x 4, calm cooperative and pleasant in triage.

## 2019-03-04 ENCOUNTER — Telehealth: Payer: Self-pay | Admitting: Oncology

## 2019-03-04 NOTE — Telephone Encounter (Signed)
Ok, thank you

## 2019-03-04 NOTE — Telephone Encounter (Signed)
Called pt to get him scheduled for a Hospital Follow-up appt. He did not answer and unable to leave message on VM. Called his brother Alphonso and spoke with him about attempting to get in contact with pt on multiple occasions. He stated the Mr. Hladky is very hard to reach and he has a problem getting him hisself. He told me to give him the appt and he would try and contact Mr. Schnack today and let him know that we need to see him on 12/1 @ 9:30.

## 2019-03-05 ENCOUNTER — Telehealth: Payer: Self-pay | Admitting: Oncology

## 2019-03-05 ENCOUNTER — Inpatient Hospital Stay: Payer: Medicare Other | Admitting: Oncology

## 2019-03-05 NOTE — Telephone Encounter (Signed)
Upon review of patient appt today I noticed that he had not check-in. Called Frank Buckley and his brother Frank Buckley to check status of check-in. No answer at either number and unable to leave a message.

## 2019-03-07 ENCOUNTER — Other Ambulatory Visit: Payer: Self-pay

## 2019-03-07 ENCOUNTER — Inpatient Hospital Stay: Payer: Medicare Other | Attending: Oncology | Admitting: Oncology

## 2019-03-07 ENCOUNTER — Encounter: Payer: Self-pay | Admitting: Oncology

## 2019-03-07 VITALS — BP 118/72 | HR 56 | Temp 96.0°F | Resp 17 | Wt 184.7 lb

## 2019-03-07 DIAGNOSIS — R0781 Pleurodynia: Secondary | ICD-10-CM | POA: Diagnosis not present

## 2019-03-07 DIAGNOSIS — Z87891 Personal history of nicotine dependence: Secondary | ICD-10-CM | POA: Insufficient documentation

## 2019-03-07 DIAGNOSIS — D472 Monoclonal gammopathy: Secondary | ICD-10-CM | POA: Diagnosis not present

## 2019-03-07 DIAGNOSIS — Z791 Long term (current) use of non-steroidal anti-inflammatories (NSAID): Secondary | ICD-10-CM | POA: Insufficient documentation

## 2019-03-07 DIAGNOSIS — K219 Gastro-esophageal reflux disease without esophagitis: Secondary | ICD-10-CM | POA: Insufficient documentation

## 2019-03-07 DIAGNOSIS — Z79899 Other long term (current) drug therapy: Secondary | ICD-10-CM | POA: Insufficient documentation

## 2019-03-07 DIAGNOSIS — R918 Other nonspecific abnormal finding of lung field: Secondary | ICD-10-CM | POA: Insufficient documentation

## 2019-03-07 DIAGNOSIS — E785 Hyperlipidemia, unspecified: Secondary | ICD-10-CM | POA: Diagnosis not present

## 2019-03-07 DIAGNOSIS — R911 Solitary pulmonary nodule: Secondary | ICD-10-CM | POA: Diagnosis not present

## 2019-03-07 NOTE — Progress Notes (Signed)
Pt reports having nagging pain in left chest that is worse with deep breathing or worse when turning head to the left. Also reports not sleeping well.

## 2019-03-08 NOTE — Progress Notes (Signed)
[place]  Telephone:(336) [phone] Fax:(336) [phone]  ID: MAXFIELD GILDERSLEEVE OB: 1945/04/30  MR#: [id number]  GLO#:[id number]  Patient Care Team: Marguerita Merles, MD as PCP - General (Family Medicine)  CHIEF COMPLAINT: MGUS, pulmonary nodules.  INTERVAL HISTORY: Patient last evaluated in clinic on March 23, 2018.  He has skipped or canceled multiple appointments in the interim.  He is referred back after being evaluated in the emergency room with increasing pleuritic chest pain.  CT scan revealed persistence of pulmonary nodules suspicious for metastatic disease.  He continues to have chest pain, but otherwise feels well. He has no neurologic complaints.  He denies any recent fevers or illnesses.  He has a good appetite and denies weight loss.  He denies any shortness of breath, cough, or hemoptysis.  He denies any nausea, vomiting, constipation, or diarrhea.  He has no urinary complaints.  Patient offers no further specific complaints today.    REVIEW OF SYSTEMS:   Review of Systems  Constitutional: Negative.  Negative for fever, malaise/fatigue and weight loss.  Respiratory: Negative.  Negative for cough, hemoptysis and shortness of breath.   Cardiovascular: Positive for chest pain. Negative for leg swelling.  Gastrointestinal: Negative.  Negative for abdominal pain.  Genitourinary: Negative.  Negative for hematuria.  Musculoskeletal: Negative.  Negative for back pain and joint pain.  Skin: Negative.  Negative for rash.  Neurological: Negative.  Negative for focal weakness, weakness and headaches.  Psychiatric/Behavioral: Negative.  The patient is not nervous/anxious.     As per HPI. Otherwise, a complete review of systems is negative.  PAST MEDICAL HISTORY: Past Medical History:  Diagnosis Date   Borderline diabetic    GERD (gastroesophageal reflux disease)     PAST SURGICAL HISTORY: Past Surgical History:  Procedure Laterality Date   CERVICAL FUSION       FAMILY HISTORY: History reviewed. No pertinent family history.  ADVANCED DIRECTIVES (Y/N):  N  HEALTH MAINTENANCE: Social History   Tobacco Use   Smoking status: Former Smoker   Smokeless tobacco: Never Used  Substance Use Topics   Alcohol use: No   Drug use: Never     Colonoscopy:  PAP:  Bone density:  Lipid panel:  Allergies  Allergen Reactions   Penicillins Rash    Current Outpatient Medications  Medication Sig Dispense Refill   diphenhydrAMINE (BENADRYL) 25 mg capsule Take 1 capsule (25 mg total) by mouth every 4 (four) hours as needed. 30 capsule 0   furosemide (LASIX) 20 MG tablet Take 1 tablet (20 mg total) by mouth daily for 3 days. 3 tablet 0   ibuprofen (ADVIL,MOTRIN) 600 MG tablet Take 1 tablet (600 mg total) by mouth every 6 (six) hours as needed for moderate pain. 30 tablet 0   Miconazole Nitrate (LOTRIMIN AF POWDER) 2 % AERP Apply 1 application topically 2 (two) times daily. 85 g 0   omeprazole (PRILOSEC) 20 MG capsule Take 20 mg by mouth daily.     pravastatin (PRAVACHOL) 20 MG tablet Take 20 mg by mouth daily.     traMADol (ULTRAM) 50 MG tablet Take 1 tablet (50 mg total) by mouth every 6 (six) hours as needed. 15 tablet 0   No current facility-administered medications for this visit.     OBJECTIVE: Vitals:   03/07/19 0836  BP: 118/72  Pulse: (!) 56  Resp: 17  Temp: (!) 96 F (35.6 C)  SpO2: 100%     Body mass index is 26.5 kg/m.  ECOG FS:1 - Symptomatic but completely ambulatory  General: Well-developed, well-nourished, no acute distress. Eyes: Pink conjunctiva, anicteric sclera. HEENT: Normocephalic, moist mucous membranes, clear oropharnyx. Lungs: Clear to auscultation bilaterally. Heart: Regular rate and rhythm. No rubs, murmurs, or gallops. Abdomen: Soft, nontender, nondistended. No organomegaly noted, normoactive bowel sounds. Musculoskeletal: No edema, cyanosis, or clubbing. Neuro: Alert, answering all questions  appropriately. Cranial nerves grossly intact. Skin: No rashes or petechiae noted. Psych: Normal affect. Lymphatics: No cervical, calvicular, axillary or inguinal LAD.  LAB RESULTS:  Lab Results  Component Value Date   NA 136 02/28/2019   K 3.9 02/28/2019   CL 104 02/28/2019   CO2 23 02/28/2019   GLUCOSE 100 (H) 02/28/2019   BUN 19 02/28/2019   CREATININE 0.98 02/28/2019   CALCIUM 9.0 02/28/2019   PROT 6.7 11/24/2018   ALBUMIN 3.5 11/24/2018   AST 25 11/24/2018   ALT 22 11/24/2018   ALKPHOS 59 11/24/2018   BILITOT 0.7 11/24/2018   GFRNONAA >60 02/28/2019   GFRAA >60 02/28/2019    Lab Results  Component Value Date   WBC 6.5 02/28/2019   NEUTROABS 2.4 11/24/2018   HGB 11.4 (L) 02/28/2019   HCT 34.8 (L) 02/28/2019   MCV 93.0 02/28/2019   PLT 220 02/28/2019   Lab Results  Component Value Date   TOTALPROTELP 7.6 05/02/2018   ALBUMINELP 4.0 05/02/2018   A1GS 0.2 05/02/2018   A2GS 1.0 05/02/2018   BETS 0.8 05/02/2018   GAMS 1.6 05/02/2018   MSPIKE 0.5 (H) 05/02/2018   SPEI Comment 05/02/2018     STUDIES: Dg Chest 2 View  Result Date: 02/28/2019 CLINICAL DATA:  Left chest pain worse with head turning. EXAM: CHEST - 2 VIEW COMPARISON:  11/30/2018 FINDINGS: Lungs are mildly hyperinflated. No signs of consolidation or pleural effusion. Heart size is normal. Suggestion of vascular congestion. IMPRESSION: 1. Mild vascular congestion. Otherwise, no acute findings in the chest. 2. Stable hyperinflation. Electronically Signed   By: Zetta Bills M.D.   On: 02/28/2019 17:19   Ct Angio Chest Pe W/cm &/or Wo Cm  Addendum Date: 02/28/2019   ADDENDUM REPORT: 02/28/2019 18:00 ADDENDUM: Findings of right upper lobe nodule and consideration for thoracic surgery consultation, possibility for adenocarcinoma was discussed. These results were called by telephone at the time of interpretation on 02/28/2019 at 6:00 pm to provider [facility] , who verbally acknowledged these results.  Electronically Signed   By: Zetta Bills M.D.   On: 02/28/2019 18:00   Result Date: 02/28/2019 CLINICAL DATA:  PE suspected, left-sided chest pain. History of known lung lesions by report. EXAM: CT ANGIOGRAPHY CHEST WITH CONTRAST TECHNIQUE: Multidetector CT imaging of the chest was performed using the standard protocol during bolus administration of intravenous contrast. Multiplanar CT image reconstructions and MIPs were obtained to evaluate the vascular anatomy. CONTRAST:  69m OMNIPAQUE IOHEXOL 350 MG/ML SOLN COMPARISON:  12/01/2018 FINDINGS: Cardiovascular: Scattered atherosclerotic changes throughout the thoracic aorta. No acute aortic process. Heart size is normal without pericardial effusion. No signs of pulmonary embolism. Mediastinum/Nodes: No enlarged mediastinal, hilar, or axillary lymph nodes. Thyroid gland, trachea, and esophagus demonstrate no significant findings. Lungs/Pleura: Signs of superior segment right lower lobe nodule with similar appearance to the prior study (image 34, series 6) 11 mm, previously approximately 11 mm when measured in a similar fashion on the previous exam. Signs of subsolid nodule along the lower portion of the right upper lobe, approximately 12 mm, previously 11 mm in greatest axial dimension (image 51, series 6)  no signs of dense consolidation, pneumothorax or effusion. No new, suspicious pulmonary nodule. Airways are patent. Upper Abdomen: Signs of right posterior hepatic hemangioma partially imaged. No acute process noted in the upper abdomen. Musculoskeletal: No signs of acute bone finding or evidence of destructive bone process. The Review of the MIP images confirms the above findings. IMPRESSION: 1. Negative for acute pulmonary embolism. 2. Stable right lower and right upper lobe nodules as described. Findings are stable since January 2020. However given morphology adenocarcinoma remains a differential consideration. Thoracic surgery consultation for follow-up  assessment may be warranted. 3. Aortic atherosclerosis. 4. Signs of right posterior hepatic hemangioma partially imaged. 5. A call is out to the referring provider to further discuss findings in above case. Aortic Atherosclerosis (ICD10-I70.0). Electronically Signed: By: Zetta Bills M.D. On: 02/28/2019 17:54    ASSESSMENT: MGUS, pulmonary nodules.  PLAN:    1. MGUS: Bone marrow biopsy completed on March 13, 2018 revealed a slightly hypercellular bone marrow with trilineage hematopoiesis.  Patient had 5% plasma cells.  Cytogenetic studies were normal.  Metastatic bone survey on February 02, 2018 revealed widespread osseous lesions.  He has a mild M spike of 0.6 with a mild IgM predominance of 237.  His kappa/lambda free light chain ratio is only mildly elevated at 1.86.  He has no evidence of endorgan damage with a normal creatinine, calcium level, and CBC.  His PSA is within normal limits.  MGUS labs will need to be repeated in the near future.   2.  Pulmonary nodules: CT scan results from February 28, 2019 reviewed independently and reported as above with a stable right lower and right upper lobe nodules compared to his most recent imaging in January 2020.  These are still highly concerning for underlying malignancy and we will get a PET scan in the next week to further evaluate.  Patient will return to clinic 1 to 2 days later to discuss the results and whether biopsy is necessary. 3.  Pleuritic chest pain: No obvious etiology on CT scan as above.  PET scan as above. 4.  Widespread osseous lesions: Per metastatic bone survey in November 2019.  Given results of bone marrow biopsy and a normal PSA, this is not myeloma or prostate cancer.  PET scan as above.   Patient expressed understanding and was in agreement with this plan. He also understands that He can call clinic at any time with any questions, concerns, or complaints.    Lloyd Huger, MD   03/08/2019 9:03 AM

## 2019-03-10 NOTE — Progress Notes (Signed)
[place]  Telephone:(336) [phone] Fax:(336) [phone]  ID: Frank Buckley OB: 02-02-1946  MR#: [id number]  JTT#:[id number]  Patient Care Team: Marguerita Merles, MD as PCP - General (Family Medicine)  CHIEF COMPLAINT: MGUS, pulmonary nodules.  INTERVAL HISTORY: Patient returns to clinic today for further evaluation and discussion of his PET scan results.  He currently feels well and is asymptomatic.  He does not complain of chest pain today. He has no neurologic complaints.  He denies any recent fevers or illnesses.  He has a good appetite and denies weight loss.  He denies any shortness of breath, cough, or hemoptysis.  He denies any nausea, vomiting, constipation, or diarrhea.  He has no urinary complaints.  Patient offers no further specific complaints today.  REVIEW OF SYSTEMS:   Review of Systems  Constitutional: Negative.  Negative for fever, malaise/fatigue and weight loss.  Respiratory: Negative.  Negative for cough, hemoptysis and shortness of breath.   Cardiovascular: Negative.  Negative for chest pain and leg swelling.  Gastrointestinal: Negative.  Negative for abdominal pain.  Genitourinary: Negative.  Negative for hematuria.  Musculoskeletal: Negative.  Negative for back pain and joint pain.  Skin: Negative.  Negative for rash.  Neurological: Negative.  Negative for focal weakness, weakness and headaches.  Psychiatric/Behavioral: Negative.  The patient is not nervous/anxious.     As per HPI. Otherwise, a complete review of systems is negative.  PAST MEDICAL HISTORY: Past Medical History:  Diagnosis Date   Borderline diabetic    GERD (gastroesophageal reflux disease)     PAST SURGICAL HISTORY: Past Surgical History:  Procedure Laterality Date   CERVICAL FUSION      FAMILY HISTORY: History reviewed. No pertinent family history.  ADVANCED DIRECTIVES (Y/N):  N  HEALTH MAINTENANCE: Social History   Tobacco Use   Smoking status: Former  Smoker   Smokeless tobacco: Never Used  Substance Use Topics   Alcohol use: No   Drug use: Never     Colonoscopy:  PAP:  Bone density:  Lipid panel:  Allergies  Allergen Reactions   Penicillins Rash    Current Outpatient Medications  Medication Sig Dispense Refill   ibuprofen (ADVIL,MOTRIN) 600 MG tablet Take 1 tablet (600 mg total) by mouth every 6 (six) hours as needed for moderate pain. 30 tablet 0   Miconazole Nitrate (LOTRIMIN AF POWDER) 2 % AERP Apply 1 application topically 2 (two) times daily. 85 g 0   omeprazole (PRILOSEC) 20 MG capsule Take 20 mg by mouth daily.     pravastatin (PRAVACHOL) 20 MG tablet Take 20 mg by mouth daily.     traMADol (ULTRAM) 50 MG tablet Take 1 tablet (50 mg total) by mouth every 6 (six) hours as needed. 15 tablet 0   diphenhydrAMINE (BENADRYL) 25 mg capsule Take 1 capsule (25 mg total) by mouth every 4 (four) hours as needed. 30 capsule 0   furosemide (LASIX) 20 MG tablet Take 1 tablet (20 mg total) by mouth daily for 3 days. 3 tablet 0   No current facility-administered medications for this visit.    OBJECTIVE: Vitals:   03/15/19 0911  BP: (!) 124/100  Pulse: (!) 59  Resp: 18  Temp: (!) 95.7 F (35.4 C)  SpO2: 100%     Body mass index is 26.65 kg/m.    ECOG FS:1 - Symptomatic but completely ambulatory  General: Well-developed, well-nourished, no acute distress.  Sitting in a wheelchair. Eyes: Pink conjunctiva, anicteric sclera. HEENT: Normocephalic, moist mucous membranes.  Lungs: No audible wheezing or coughing. Heart: Regular rate and rhythm. Abdomen: Soft, nontender, no obvious distention. Musculoskeletal: No edema, cyanosis, or clubbing. Neuro: Alert, answering all questions appropriately. Cranial nerves grossly intact. Skin: No rashes or petechiae noted. Psych: Normal affect.  LAB RESULTS:  Lab Results  Component Value Date   NA 136 02/28/2019   K 3.9 02/28/2019   CL 104 02/28/2019   CO2 23 02/28/2019    GLUCOSE 100 (H) 02/28/2019   BUN 19 02/28/2019   CREATININE 0.98 02/28/2019   CALCIUM 9.0 02/28/2019   PROT 6.7 11/24/2018   ALBUMIN 3.5 11/24/2018   AST 25 11/24/2018   ALT 22 11/24/2018   ALKPHOS 59 11/24/2018   BILITOT 0.7 11/24/2018   GFRNONAA >60 02/28/2019   GFRAA >60 02/28/2019    Lab Results  Component Value Date   WBC 6.5 02/28/2019   NEUTROABS 2.4 11/24/2018   HGB 11.4 (L) 02/28/2019   HCT 34.8 (L) 02/28/2019   MCV 93.0 02/28/2019   PLT 220 02/28/2019   Lab Results  Component Value Date   TOTALPROTELP 7.6 05/02/2018   ALBUMINELP 4.0 05/02/2018   A1GS 0.2 05/02/2018   A2GS 1.0 05/02/2018   BETS 0.8 05/02/2018   GAMS 1.6 05/02/2018   MSPIKE 0.5 (H) 05/02/2018   SPEI Comment 05/02/2018     STUDIES: DG Chest 2 View  Result Date: 02/28/2019 CLINICAL DATA:  Left chest pain worse with head turning. EXAM: CHEST - 2 VIEW COMPARISON:  11/30/2018 FINDINGS: Lungs are mildly hyperinflated. No signs of consolidation or pleural effusion. Heart size is normal. Suggestion of vascular congestion. IMPRESSION: 1. Mild vascular congestion. Otherwise, no acute findings in the chest. 2. Stable hyperinflation. Electronically Signed   By: Zetta Bills M.D.   On: 02/28/2019 17:19   CT Angio Chest PE W/Cm &/Or Wo Cm  Addendum Date: 02/28/2019   ADDENDUM REPORT: 02/28/2019 18:00 ADDENDUM: Findings of right upper lobe nodule and consideration for thoracic surgery consultation, possibility for adenocarcinoma was discussed. These results were called by telephone at the time of interpretation on 02/28/2019 at 6:00 pm to provider [facility] , who verbally acknowledged these results. Electronically Signed   By: Zetta Bills M.D.   On: 02/28/2019 18:00   Result Date: 02/28/2019 CLINICAL DATA:  PE suspected, left-sided chest pain. History of known lung lesions by report. EXAM: CT ANGIOGRAPHY CHEST WITH CONTRAST TECHNIQUE: Multidetector CT imaging of the chest was performed using the  standard protocol during bolus administration of intravenous contrast. Multiplanar CT image reconstructions and MIPs were obtained to evaluate the vascular anatomy. CONTRAST:  70m OMNIPAQUE IOHEXOL 350 MG/ML SOLN COMPARISON:  12/01/2018 FINDINGS: Cardiovascular: Scattered atherosclerotic changes throughout the thoracic aorta. No acute aortic process. Heart size is normal without pericardial effusion. No signs of pulmonary embolism. Mediastinum/Nodes: No enlarged mediastinal, hilar, or axillary lymph nodes. Thyroid gland, trachea, and esophagus demonstrate no significant findings. Lungs/Pleura: Signs of superior segment right lower lobe nodule with similar appearance to the prior study (image 34, series 6) 11 mm, previously approximately 11 mm when measured in a similar fashion on the previous exam. Signs of subsolid nodule along the lower portion of the right upper lobe, approximately 12 mm, previously 11 mm in greatest axial dimension (image 51, series 6) no signs of dense consolidation, pneumothorax or effusion. No new, suspicious pulmonary nodule. Airways are patent. Upper Abdomen: Signs of right posterior hepatic hemangioma partially imaged. No acute process noted in the upper abdomen. Musculoskeletal: No signs of acute bone finding or  evidence of destructive bone process. The Review of the MIP images confirms the above findings. IMPRESSION: 1. Negative for acute pulmonary embolism. 2. Stable right lower and right upper lobe nodules as described. Findings are stable since January 2020. However given morphology adenocarcinoma remains a differential consideration. Thoracic surgery consultation for follow-up assessment may be warranted. 3. Aortic atherosclerosis. 4. Signs of right posterior hepatic hemangioma partially imaged. 5. A call is out to the referring provider to further discuss findings in above case. Aortic Atherosclerosis (ICD10-I70.0). Electronically Signed: By: Zetta Bills M.D. On: 02/28/2019  17:54   NM PET Image Initial (PI) Skull Base To Thigh  Result Date: 03/14/2019 CLINICAL DATA:  Initial treatment strategy for right lower lobe pulmonary nodule. EXAM: NUCLEAR MEDICINE PET SKULL BASE TO THIGH TECHNIQUE: 9.81 mCi F-18 FDG was injected intravenously. Full-ring PET imaging was performed from the skull base to thigh after the radiotracer. CT data was obtained and used for attenuation correction and anatomic localization. Fasting blood glucose: 93 mg/dl COMPARISON:  Chest CT 02/28/2019 FINDINGS: Mediastinal blood pool activity: SUV max 1.88 Liver activity: SUV max NA NECK: No hypermetabolic lymph nodes in the neck. Incidental CT findings: none CHEST: Subsolid 9 mm lesion in the superior segment of the right lower lobe on image 95/3 does not demonstrate any significant hypermetabolism. SUV max is 0.99. Sub solid 11 mm nodule in the right upper lobe has 2 smaller adjacent nodules. This does not show any significant hypermetabolism with SUV max of 1.13. No enlarged or hypermetabolic mediastinal or hilar lymph nodes. No supraclavicular or axillary adenopathy. Incidental CT findings: none ABDOMEN/PELVIS: No abnormal hypermetabolic activity within the liver, pancreas, adrenal glands, or spleen. No hypermetabolic lymph nodes in the abdomen or pelvis. Incidental CT findings: Stable right hepatic lobe hemangioma. SKELETON: No focal hypermetabolic activity to suggest skeletal metastasis. Incidental CT findings: none IMPRESSION: 1. Right upper lobe and superior segment right lower lobe nodules do not show any significant hypermetabolism. Recommend continued CT surveillance with repeat noncontrast chest CT in 6 months suggested. 2. No enlarged or hypermetabolic mediastinal or hilar lymph nodes. 3. No significant findings in the neck, abdomen or pelvis. Electronically Signed   By: Marijo Sanes M.D.   On: 03/14/2019 14:08    ASSESSMENT: MGUS, pulmonary nodules.  PLAN:    1. MGUS: Bone marrow biopsy  completed on March 13, 2018 revealed a slightly hypercellular bone marrow with trilineage hematopoiesis.  Patient had 5% plasma cells.  Cytogenetic studies were normal.  Metastatic bone survey on February 02, 2018 revealed widespread osseous lesions.  He has a mild M spike of 0.6 with a mild IgM predominance of 237.  His kappa/lambda free light chain ratio is only mildly elevated at 1.86.  He has no evidence of endorgan damage with a normal creatinine, calcium level, and CBC.  His PSA is within normal limits.  Return to clinic in 6 months with repeat laboratory work and further evaluation.   2.  Pulmonary nodules: CT scan results from February 28, 2019 reviewed independently with a stable right lower and right upper lobe nodules compared to his most recent imaging in January 2020. PET scan results from March 14, 2019 did not reveal any significant hypermetabolism.  Repeat CT scan in 6 months to ensure stability. 3.  Pleuritic chest pain: Resolved. 4.  Widespread osseous lesions: Per metastatic bone survey in November 2019.  Given results of bone marrow biopsy and a normal PSA, this is not myeloma or prostate cancer.  PET scan is  negative.  Patient expressed understanding and was in agreement with this plan. He also understands that He can call clinic at any time with any questions, concerns, or complaints.    Lloyd Huger, MD   03/15/2019 2:09 PM

## 2019-03-12 NOTE — Progress Notes (Signed)
03/13/2019 8:18 AM   Frank Buckley May 13, 1945 [id number]  Referring provider: Marguerita Merles, [place] [facility] [place],  [place] [address]  Chief Complaint  Patient presents with  . Benign Prostatic Hypertrophy    HPI: Patient is a 73 -year-old male who a history of hematuria who presents today for follow up.  History of hematuria (high risk) Former smoker.  CTU 12/2017 Possible left nephrolithiasis. No other explanation for hematuria.   Aortic Atherosclerosis (ICD10-I70.0).  Right hepatic lobe hemangioma.  Heterogeneous density throughout the pelvis. Although this could relate to aggressive osteoporosis, recommend clinical correlation for multiple myeloma.  Cystoscopy with Dr. Bernardo Heater 12/2017 Moderate lateral lobe enlargement with hypervascularity prostatic urethra.  Mild elevation bladder neck.  Urine cytology negative for malignant cells.  (seen by cancer center for multiple myeloma) he does not report any episodes of gross hematuria over the last year.  His UA today is negative.    He is having baseline nocturia, incontinence and hesitancy.  He does not find it bothersome at this time.    He does complain of having bilateral shoulder pain.  On examination of his left shoulder he has what appears to be perhaps bursitis.  He has a PET scan scheduled for the morning.  If PET scan finds no abnormalities within the shoulder I will be happy to refer him to orthopedics.  PMH: Past Medical History:  Diagnosis Date  . Borderline diabetic   . GERD (gastroesophageal reflux disease)     Surgical History: Past Surgical History:  Procedure Laterality Date  . CERVICAL FUSION      Home Medications:  Allergies as of 03/13/2019      Reactions   Penicillins Rash      Medication List       Accurate as of March 13, 2019 11:59 PM. If you have any questions, ask your nurse or doctor.        diphenhydrAMINE 25 mg capsule Commonly known as: BENADRYL Take 1 capsule (25 mg  total) by mouth every 4 (four) hours as needed.   furosemide 20 MG tablet Commonly known as: LASIX Take 1 tablet (20 mg total) by mouth daily for 3 days.   ibuprofen 600 MG tablet Commonly known as: ADVIL Take 1 tablet (600 mg total) by mouth every 6 (six) hours as needed for moderate pain.   Lotrimin AF Powder 2 % Aerp Generic drug: Miconazole Nitrate Apply 1 application topically 2 (two) times daily.   omeprazole 20 MG capsule Commonly known as: PRILOSEC Take 20 mg by mouth daily.   pravastatin 20 MG tablet Commonly known as: PRAVACHOL Take 20 mg by mouth daily.   traMADol 50 MG tablet Commonly known as: ULTRAM Take 1 tablet (50 mg total) by mouth every 6 (six) hours as needed.       Allergies:  Allergies  Allergen Reactions  . Penicillins Rash    Family History: No family history on file.  Social History:  reports that he has quit smoking. He has never used smokeless tobacco. He reports that he does not drink alcohol or use drugs.  ROS: UROLOGY Frequent Urination?: No Hard to postpone urination?: No Burning/pain with urination?: No Get up at night to urinate?: Yes Leakage of urine?: Yes Urine stream starts and stops?: No Trouble starting stream?: Yes Do you have to strain to urinate?: No Blood in urine?: No Urinary tract infection?: No Sexually transmitted disease?: No Injury to kidneys or bladder?: No Painful intercourse?: No Weak  stream?: No Erection problems?: No Penile pain?: No  Gastrointestinal Nausea?: No Vomiting?: No Indigestion/heartburn?: No Diarrhea?: No Constipation?: Yes  Constitutional Fever: No Night sweats?: No Weight loss?: No Fatigue?: Yes  Skin Skin rash/lesions?: No Itching?: No  Eyes Blurred vision?: No Double vision?: No  Ears/Nose/Throat Sore throat?: No Sinus problems?: No  Hematologic/Lymphatic Swollen glands?: No Easy bruising?: No  Cardiovascular Leg swelling?: No Chest pain?:  Yes  Respiratory Cough?: No Shortness of breath?: No  Endocrine Excessive thirst?: No  Musculoskeletal Back pain?: Yes Joint pain?: Yes  Neurological Headaches?: No Dizziness?: No  Psychologic Depression?: No Anxiety?: No  Physical Exam: BP 126/78   Pulse 70   Ht '5\' 10"'$  (1.778 m)   Wt 184 lb (83.5 kg)   BMI 26.40 kg/m   Constitutional:  Well nourished. Alert and oriented, No acute distress. HEENT: [place] AT, moist mucus membranes.  Trachea midline, no masses. Cardiovascular: No clubbing, cyanosis, or edema. Respiratory: Normal respiratory effort, no increased work of breathing. Neurologic: Grossly intact, no focal deficits, moving all 4 extremities. Psychiatric: Normal mood and affect.  Laboratory Data: Lab Results  Component Value Date   WBC 6.5 02/28/2019   HGB 11.4 (L) 02/28/2019   HCT 34.8 (L) 02/28/2019   MCV 93.0 02/28/2019   PLT 220 02/28/2019    Lab Results  Component Value Date   CREATININE 0.98 02/28/2019    No results found for: PSA  No results found for: TESTOSTERONE  No results found for: HGBA1C  No results found for: TSH  No results found for: CHOL, HDL, CHOLHDL, VLDL, LDLCALC  Lab Results  Component Value Date   AST 25 11/24/2018   Lab Results  Component Value Date   ALT 22 11/24/2018   No components found for: ALKALINEPHOPHATASE No components found for: BILIRUBINTOTAL  No results found for: ESTRADIOL   Urinalysis Component     Latest Ref Rng & Units 03/13/2019  Specific Gravity, UA     1.005 - 1.030 1.015  pH, UA     5.0 - 7.5 8.0 (H)  Color, UA     Yellow Yellow  Appearance Ur     Clear Clear  Leukocytes,UA     Negative Negative  Protein,UA     Negative/Trace Negative  Glucose, UA     Negative Negative  Ketones, UA     Negative Negative  RBC, UA     Negative Negative  Bilirubin, UA     Negative Negative  Urobilinogen, Ur     0.2 - 1.0 mg/dL 0.2  Nitrite, UA     Negative Negative  Microscopic Examination       See below:   Component     Latest Ref Rng & Units 03/13/2019  WBC, UA     0 - 5 /hpf None seen  RBC     0 - 2 /hpf None seen  Epithelial Cells (non renal)     0 - 10 /hpf None seen  Bacteria, UA     None seen/Few None seen    I have reviewed the labs.   Assessment & Plan:   1. History of hematuria Hematuria work up completed in 12/2017 - findings positive for hypervascular prostate No report of gross hematuria  UA today negative  RTC in one year for UA - patient to report any gross hematuria in the interim    2. BPH with LU TS Continue conservative management  Return in about 1 year (around 03/12/2020) for UA and symptoms recheck .  These notes generated with voice recognition software. I apologize for typographical errors.  Zara Council, PA-C  [facility] [address] [place]  [place], [place] [address] [phone]

## 2019-03-13 ENCOUNTER — Other Ambulatory Visit: Payer: Self-pay

## 2019-03-13 ENCOUNTER — Ambulatory Visit (INDEPENDENT_AMBULATORY_CARE_PROVIDER_SITE_OTHER): Payer: Medicare Other | Admitting: Urology

## 2019-03-13 ENCOUNTER — Encounter: Payer: Self-pay | Admitting: Urology

## 2019-03-13 VITALS — BP 126/78 | HR 70 | Ht 70.0 in | Wt 184.0 lb

## 2019-03-13 DIAGNOSIS — Z87448 Personal history of other diseases of urinary system: Secondary | ICD-10-CM

## 2019-03-13 DIAGNOSIS — N138 Other obstructive and reflux uropathy: Secondary | ICD-10-CM | POA: Diagnosis not present

## 2019-03-13 DIAGNOSIS — N401 Enlarged prostate with lower urinary tract symptoms: Secondary | ICD-10-CM

## 2019-03-14 ENCOUNTER — Encounter
Admission: RE | Admit: 2019-03-14 | Discharge: 2019-03-14 | Disposition: A | Discharge status: Home / Self Care | Payer: Medicare Other | Source: Ambulatory Visit | Attending: Oncology | Admitting: Oncology

## 2019-03-14 ENCOUNTER — Other Ambulatory Visit: Payer: Self-pay

## 2019-03-14 DIAGNOSIS — Z79899 Other long term (current) drug therapy: Secondary | ICD-10-CM | POA: Insufficient documentation

## 2019-03-14 DIAGNOSIS — D472 Monoclonal gammopathy: Secondary | ICD-10-CM | POA: Insufficient documentation

## 2019-03-14 DIAGNOSIS — R918 Other nonspecific abnormal finding of lung field: Secondary | ICD-10-CM | POA: Diagnosis not present

## 2019-03-14 DIAGNOSIS — R911 Solitary pulmonary nodule: Secondary | ICD-10-CM

## 2019-03-14 LAB — URINALYSIS, COMPLETE
Bilirubin, UA: NEGATIVE
Glucose, UA: NEGATIVE
Ketones, UA: NEGATIVE
Leukocytes,UA: NEGATIVE
Nitrite, UA: NEGATIVE
Protein,UA: NEGATIVE
RBC, UA: NEGATIVE
Specific Gravity, UA: 1.015 (ref 1.005–1.030)
Urobilinogen, Ur: 0.2 mg/dL (ref 0.2–1.0)
pH, UA: 8 — ABNORMAL HIGH (ref 5.0–7.5)

## 2019-03-14 LAB — MICROSCOPIC EXAMINATION
Bacteria, UA: NONE SEEN
Epithelial Cells (non renal): NONE SEEN /hpf (ref 0–10)
RBC, Urine: NONE SEEN /hpf (ref 0–2)
WBC, UA: NONE SEEN /hpf (ref 0–5)

## 2019-03-14 LAB — GLUCOSE, CAPILLARY: Glucose-Capillary: 93 mg/dL (ref 70–99)

## 2019-03-14 MED ORDER — FLUDEOXYGLUCOSE F - 18 (FDG) INJECTION
9.8100 | Freq: Once | INTRAVENOUS | Status: AC | PRN
  Administered 2019-03-14: 10:00:00 9.81 via INTRAVENOUS

## 2019-03-15 ENCOUNTER — Encounter: Payer: Self-pay | Admitting: Oncology

## 2019-03-15 ENCOUNTER — Other Ambulatory Visit: Payer: Self-pay

## 2019-03-15 ENCOUNTER — Inpatient Hospital Stay (HOSPITAL_BASED_OUTPATIENT_CLINIC_OR_DEPARTMENT_OTHER): Payer: Medicare Other | Admitting: Oncology

## 2019-03-15 VITALS — BP 124/100 | HR 59 | Temp 95.7°F | Resp 18 | Wt 185.7 lb

## 2019-03-15 DIAGNOSIS — D472 Monoclonal gammopathy: Secondary | ICD-10-CM

## 2019-03-15 DIAGNOSIS — R911 Solitary pulmonary nodule: Secondary | ICD-10-CM

## 2019-03-15 NOTE — Progress Notes (Signed)
Patient is here for follow-up, patient is concerned about a bump on his left shoulder  and both arms feel weighted down.

## 2019-03-25 ENCOUNTER — Other Ambulatory Visit: Payer: Medicare Other

## 2019-04-22 ENCOUNTER — Other Ambulatory Visit: Payer: Medicare Other

## 2019-05-20 ENCOUNTER — Other Ambulatory Visit: Payer: Medicare Other

## 2019-06-12 ENCOUNTER — Ambulatory Visit
Admission: RE | Admit: 2019-06-12 | Discharge: 2019-06-12 | Disposition: A | Discharge status: Home / Self Care | Payer: Medicare HMO | Source: Ambulatory Visit | Attending: Oncology | Admitting: Oncology

## 2019-06-12 ENCOUNTER — Inpatient Hospital Stay: Payer: Medicare HMO | Attending: Oncology

## 2019-06-12 ENCOUNTER — Other Ambulatory Visit: Payer: Self-pay

## 2019-06-12 DIAGNOSIS — D472 Monoclonal gammopathy: Secondary | ICD-10-CM | POA: Insufficient documentation

## 2019-06-12 DIAGNOSIS — J439 Emphysema, unspecified: Secondary | ICD-10-CM | POA: Insufficient documentation

## 2019-06-12 DIAGNOSIS — Z87891 Personal history of nicotine dependence: Secondary | ICD-10-CM | POA: Insufficient documentation

## 2019-06-12 DIAGNOSIS — R911 Solitary pulmonary nodule: Secondary | ICD-10-CM | POA: Diagnosis present

## 2019-06-12 LAB — CBC WITH DIFFERENTIAL/PLATELET
Abs Immature Granulocytes: 0.01 K/uL (ref 0.00–0.07)
Basophils Absolute: 0 K/uL (ref 0.0–0.1)
Basophils Relative: 1 %
Eosinophils Absolute: 0.2 K/uL (ref 0.0–0.5)
Eosinophils Relative: 5 %
HCT: 34.3 % — ABNORMAL LOW (ref 39.0–52.0)
Hemoglobin: 11.3 g/dL — ABNORMAL LOW (ref 13.0–17.0)
Immature Granulocytes: 0 %
Lymphocytes Relative: 28 %
Lymphs Abs: 1.4 K/uL (ref 0.7–4.0)
MCH: 30.8 pg (ref 26.0–34.0)
MCHC: 32.9 g/dL (ref 30.0–36.0)
MCV: 93.5 fL (ref 80.0–100.0)
Monocytes Absolute: 0.4 K/uL (ref 0.1–1.0)
Monocytes Relative: 8 %
Neutro Abs: 2.9 K/uL (ref 1.7–7.7)
Neutrophils Relative %: 58 %
Platelets: 186 K/uL (ref 150–400)
RBC: 3.67 MIL/uL — ABNORMAL LOW (ref 4.22–5.81)
RDW: 12.3 % (ref 11.5–15.5)
WBC: 4.9 K/uL (ref 4.0–10.5)
nRBC: 0 % (ref 0.0–0.2)

## 2019-06-12 LAB — BASIC METABOLIC PANEL
Anion gap: 7 (ref 5–15)
BUN: 13 mg/dL (ref 8–23)
CO2: 25 mmol/L (ref 22–32)
Calcium: 8.7 mg/dL — ABNORMAL LOW (ref 8.9–10.3)
Chloride: 105 mmol/L (ref 98–111)
Creatinine, Ser: 0.71 mg/dL (ref 0.61–1.24)
GFR calc Af Amer: >60 mL/min (ref >60)
GFR calc non Af Amer: >60 mL/min (ref >60)
Glucose, Bld: 92 mg/dL (ref 70–99)
Potassium: 3.6 mmol/L (ref 3.5–5.1)
Sodium: 137 mmol/L (ref 135–145)

## 2019-06-12 MED ORDER — IOHEXOL 300 MG/ML  SOLN
75.0000 mL | Freq: Once | INTRAMUSCULAR | Status: AC | PRN
  Administered 2019-06-12: 09:00:00 75 mL via INTRAVENOUS

## 2019-06-13 LAB — KAPPA/LAMBDA LIGHT CHAINS
Kappa free light chain: 23.6 mg/L — ABNORMAL HIGH (ref 3.3–19.4)
Kappa, lambda light chain ratio: 2.07 — ABNORMAL HIGH (ref 0.26–1.65)
Lambda free light chains: 11.4 mg/L (ref 5.7–26.3)

## 2019-06-13 LAB — IGG, IGA, IGM
IgA: 86 mg/dL (ref 61–437)
IgG (Immunoglobin G), Serum: 1592 mg/dL (ref 603–1613)
IgM (Immunoglobulin M), Srm: 251 mg/dL — ABNORMAL HIGH (ref 15–143)

## 2019-06-13 NOTE — Progress Notes (Signed)
[name]  [phone] [phone]  ID: Frank Buckley OB: [birth year]  MR#: [id number]  CWC#:[id number]  Patient Care Team: Marguerita Merles, MD as PCP - General (Family Medicine)  CHIEF COMPLAINT: MGUS, pulmonary nodules.  INTERVAL HISTORY: Patient returns to clinic today for routine 60-monthevaluation and discussion of his imaging and laboratory work.  He continues to feel well and remains asymptomatic.  He has occasional chest pain that resolves with Tylenol.  He has no neurologic complaints.  He denies any recent fevers or illnesses.  He has a good appetite and denies weight loss.  He denies any shortness of breath, cough, or hemoptysis.  He denies any nausea, vomiting, constipation, or diarrhea.  He has no urinary complaints.  Patient offers no further specific complaints today.  REVIEW OF SYSTEMS:   Review of Systems  Constitutional: Negative.  Negative for fever, malaise/fatigue and weight loss.  Respiratory: Negative.  Negative for cough, hemoptysis and shortness of breath.   Cardiovascular: Negative.  Negative for chest pain and leg swelling.  Gastrointestinal: Negative.  Negative for abdominal pain.  Genitourinary: Negative.  Negative for hematuria.  Musculoskeletal: Negative.  Negative for back pain and joint pain.  Skin: Negative.  Negative for rash.  Neurological: Negative.  Negative for focal weakness, weakness and headaches.  Psychiatric/Behavioral: Negative.  The patient is not nervous/anxious.     As per HPI. Otherwise, a complete review of systems is negative.  PAST MEDICAL HISTORY: Past Medical History:  Diagnosis Date  . Borderline diabetic   . GERD (gastroesophageal reflux disease)     PAST SURGICAL HISTORY: Past Surgical History:  Procedure Laterality Date  . CERVICAL FUSION      FAMILY HISTORY: History reviewed. No pertinent family history.  ADVANCED DIRECTIVES (Y/N):  N  HEALTH MAINTENANCE: Social History    Tobacco Use  . Smoking status: Former SResearch scientist (life sciences) . Smokeless tobacco: Never Used  Substance Use Topics  . Alcohol use: No  . Drug use: Never     Colonoscopy:  PAP:  Bone density:  Lipid panel:  Allergies  Allergen Reactions  . Penicillins Rash    Current Outpatient Medications  Medication Sig Dispense Refill  . ibuprofen (ADVIL,MOTRIN) 600 MG tablet Take 1 tablet (600 mg total) by mouth every 6 (six) hours as needed for moderate pain. 30 tablet 0  . Miconazole Nitrate (LOTRIMIN AF POWDER) 2 % AERP Apply 1 application topically 2 (two) times daily. 85 g 0  . omeprazole (PRILOSEC) 20 MG capsule Take 20 mg by mouth daily.    . pravastatin (PRAVACHOL) 20 MG tablet Take 20 mg by mouth daily.    . traMADol (ULTRAM) 50 MG tablet Take 1 tablet (50 mg total) by mouth every 6 (six) hours as needed. 15 tablet 0  . diphenhydrAMINE (BENADRYL) 25 mg capsule Take 1 capsule (25 mg total) by mouth every 4 (four) hours as needed. 30 capsule 0  . furosemide (LASIX) 20 MG tablet Take 1 tablet (20 mg total) by mouth daily for 3 days. 3 tablet 0   No current facility-administered medications for this visit.    OBJECTIVE: Vitals:   06/17/19 1104  BP: 122/80  Pulse: (!) 51  Resp: 20     Body mass index is 26.69 kg/m.    ECOG FS:0 - Asymptomatic  General: Well-developed, well-nourished, no acute distress.  Sitting in a wheelchair. Eyes: Pink conjunctiva, anicteric sclera. HEENT: Normocephalic, moist mucous membranes. Lungs: No audible wheezing or coughing. Heart: Regular rate  and rhythm. Abdomen: Soft, nontender, no obvious distention. Musculoskeletal: No edema, cyanosis, or clubbing. Neuro: Alert, answering all questions appropriately. Cranial nerves grossly intact. Skin: No rashes or petechiae noted. Psych: Normal affect.   LAB RESULTS:  Lab Results  Component Value Date   NA 137 06/12/2019   K 3.6 06/12/2019   CL 105 06/12/2019   CO2 25 06/12/2019   GLUCOSE 92 06/12/2019   BUN 13  06/12/2019   CREATININE 0.71 06/12/2019   CALCIUM 8.7 (L) 06/12/2019   PROT 6.7 11/24/2018   ALBUMIN 3.5 11/24/2018   AST 25 11/24/2018   ALT 22 11/24/2018   ALKPHOS 59 11/24/2018   BILITOT 0.7 11/24/2018   GFRNONAA >60 06/12/2019   GFRAA >60 06/12/2019    Lab Results  Component Value Date   WBC 4.9 06/12/2019   NEUTROABS 2.9 06/12/2019   HGB 11.3 (L) 06/12/2019   HCT 34.3 (L) 06/12/2019   MCV 93.5 06/12/2019   PLT 186 06/12/2019   Lab Results  Component Value Date   TOTALPROTELP 6.6 06/12/2019   ALBUMINELP 3.8 06/12/2019   A1GS 0.2 06/12/2019   A2GS 0.5 06/12/2019   BETS 0.7 06/12/2019   GAMS 1.4 06/12/2019   MSPIKE 0.5 (H) 06/12/2019   SPEI Comment 06/12/2019     STUDIES: CT Chest W Contrast  Result Date: 06/12/2019 CLINICAL DATA:  Monoclonal gammopathy.  Bone lesions. EXAM: CT CHEST WITH CONTRAST TECHNIQUE: Multidetector CT imaging of the chest was performed during intravenous contrast administration. CONTRAST:  86m OMNIPAQUE IOHEXOL 300 MG/ML  SOLN COMPARISON:  PET 03/14/2019 and CT chest 02/28/2019 and 05/02/2018. FINDINGS: Cardiovascular: Atherosclerotic calcification of the aorta. Heart is at the upper limits of normal in size. No pericardial effusion. Mediastinum/Nodes: No pathologically enlarged mediastinal or hilar lymph nodes. Left subpectoral and left axillary lymph nodes have increased in size slightly, now measuring up to 10 mm (2/36), compared to 6 mm on 02/28/2019. There are several right axillary lymph nodes, none of which meet CT size criteria and appear similar to the prior exam. Esophagus is grossly unremarkable. Lungs/Pleura: Mild centrilobular emphysema. Bilateral pulmonary nodules are stable from 05/02/2018. These include an irregular nodule in the anterior segment right upper lobe measuring 1.1 x 1.8 cm (3/80), a 9 x 11 mm superior segment right lower lobe nodule (3/56), a 4 mm superior segment left lower lobe nodule and probable subpleural lymph node  along the left major fissure (3/71), stable. Minimal dependent atelectasis bilaterally. No pleural fluid. Airway is unremarkable. Upper Abdomen: Low-attenuation lesion with peripheral contrast puddling in the posterior right hepatic lobe measures 2.5 x 2.7 cm, unchanged and indicative of a hemangioma. Subcentimeter low-attenuation lesions in the liver are too small to characterize. Visualized portions of the liver, gallbladder, adrenal glands, kidneys, spleen, pancreas, stomach and bowel are otherwise unremarkable with the exception of a tiny hiatal hernia. No upper abdominal adenopathy. Musculoskeletal: Degenerative changes in the spine. No worrisome lytic or sclerotic lesions. IMPRESSION: 1. Right upper and right lower lobe nodules are stable from 05/02/2018 and were not shown to be hypermetabolic on 173/41/9379 Additional follow-up CT chest without contrast in 6-12 months is recommended as indolent adenocarcinoma cannot be definitively excluded. 2. Slight enlargement of left subpectoral and left axillary lymph nodes can be seen in the setting of recent COVID-19 vaccination. Please correlate clinically. 3.  Aortic atherosclerosis (ICD10-I70.0). 4.  Emphysema (ICD10-J43.9). Electronically Signed   By: MLorin PicketM.D.   On: 06/12/2019 10:58    ASSESSMENT: MGUS, pulmonary nodules.  PLAN:  1. MGUS: Bone marrow biopsy completed on March 13, 2018 revealed a slightly hypercellular bone marrow with trilineage hematopoiesis.  Patient had 5% plasma cells.  Cytogenetic studies were normal.  Metastatic bone survey on February 02, 2018 revealed widespread osseous lesions.  Patient's most recent M spike is 0.5 which is essentially unchanged from previous.  He has a mild IgM predominance and elevated kappa/lambda light chain ratio both of which are unchanged as well. He has no evidence of endorgan damage with a normal creatinine, calcium level, and CBC.  His PSA is within normal limits.  Return to clinic in 6  months with repeat laboratory work and further evaluation.   2.  Pulmonary nodules: CT scan results from June 12, 2019 reviewed independently and reported as above with stable pulmonary nodules. PET scan results from March 14, 2019 did not reveal any significant hypermetabolism.  Repeat imaging in 6 months with follow-up 1 to 2 days later.  If everything remains stable, can consider transitioning to yearly imaging. 3.  Osseous lesions: Per metastatic bone survey in November 2019.  Given results of bone marrow biopsy and a normal PSA, this is not myeloma or prostate cancer.  PET scan is negative.  I spent a total of 20 minutes reviewing chart data, face-to-face evaluation with the patient, counseling and coordination of care as detailed above.   Patient expressed understanding and was in agreement with this plan. He also understands that He can call clinic at any time with any questions, concerns, or complaints.    Lloyd Huger, MD   06/17/2019 11:58 AM

## 2019-06-14 LAB — PROTEIN ELECTROPHORESIS, SERUM
A/G Ratio: 1.4 (ref 0.7–1.7)
Albumin ELP: 3.8 g/dL (ref 2.9–4.4)
Alpha-1-Globulin: 0.2 g/dL (ref 0.0–0.4)
Alpha-2-Globulin: 0.5 g/dL (ref 0.4–1.0)
Beta Globulin: 0.7 g/dL (ref 0.7–1.3)
Gamma Globulin: 1.4 g/dL (ref 0.4–1.8)
Globulin, Total: 2.8 g/dL (ref 2.2–3.9)
M-Spike, %: 0.5 g/dL — ABNORMAL HIGH
Total Protein ELP: 6.6 g/dL (ref 6.0–8.5)

## 2019-06-17 ENCOUNTER — Other Ambulatory Visit: Payer: Medicare Other

## 2019-06-17 ENCOUNTER — Encounter: Payer: Self-pay | Admitting: Oncology

## 2019-06-17 ENCOUNTER — Inpatient Hospital Stay (HOSPITAL_BASED_OUTPATIENT_CLINIC_OR_DEPARTMENT_OTHER): Payer: Medicare HMO | Admitting: Oncology

## 2019-06-17 ENCOUNTER — Other Ambulatory Visit: Payer: Self-pay

## 2019-06-17 VITALS — BP 122/80 | HR 51 | Resp 20 | Wt 186.0 lb

## 2019-06-17 DIAGNOSIS — D472 Monoclonal gammopathy: Secondary | ICD-10-CM

## 2019-06-17 DIAGNOSIS — R911 Solitary pulmonary nodule: Secondary | ICD-10-CM

## 2019-06-17 NOTE — Progress Notes (Signed)
Patient denies any concerns today.  

## 2019-07-22 ENCOUNTER — Other Ambulatory Visit: Payer: Medicare Other

## 2019-09-30 NOTE — Progress Notes (Signed)
Certified Letter  

## 2019-10-22 ENCOUNTER — Other Ambulatory Visit: Payer: Self-pay | Admitting: Family Medicine

## 2019-10-22 DIAGNOSIS — R109 Unspecified abdominal pain: Secondary | ICD-10-CM

## 2019-10-24 ENCOUNTER — Encounter: Payer: Self-pay | Admitting: Emergency Medicine

## 2019-10-24 ENCOUNTER — Emergency Department
Admission: EM | Admit: 2019-10-24 | Discharge: 2019-10-24 | Disposition: A | Discharge status: Home / Self Care | Payer: Medicare HMO | Attending: Emergency Medicine | Admitting: Emergency Medicine

## 2019-10-24 ENCOUNTER — Other Ambulatory Visit: Payer: Self-pay

## 2019-10-24 ENCOUNTER — Emergency Department: Payer: Medicare HMO

## 2019-10-24 DIAGNOSIS — Z87891 Personal history of nicotine dependence: Secondary | ICD-10-CM | POA: Diagnosis not present

## 2019-10-24 DIAGNOSIS — M79642 Pain in left hand: Secondary | ICD-10-CM | POA: Insufficient documentation

## 2019-10-24 DIAGNOSIS — W19XXXA Unspecified fall, initial encounter: Secondary | ICD-10-CM | POA: Insufficient documentation

## 2019-10-24 DIAGNOSIS — E119 Type 2 diabetes mellitus without complications: Secondary | ICD-10-CM | POA: Diagnosis not present

## 2019-10-24 DIAGNOSIS — Z880 Allergy status to penicillin: Secondary | ICD-10-CM | POA: Insufficient documentation

## 2019-10-24 MED ORDER — DICLOFENAC SODIUM 1 % EX GEL: 2.0000 g | Freq: Four times a day (QID) | CUTANEOUS | 1 refills | Status: DC

## 2019-10-24 NOTE — ED Triage Notes (Signed)
[place] last week  Arrives today c/o continued left hand pain.  AAOx3.  Skin warm and dry. NAD

## 2019-10-24 NOTE — ED Provider Notes (Signed)
[facility] Emergency Department Provider Note   ____________________________________________   None    (approximate)  I have reviewed the triage vital signs and the nursing notes.   HISTORY  Chief Complaint Hand Pain    HPI Frank Buckley is a 74 y.o. male patient complain of left hand pain secondary to a fall last week.  Patient denies loss of function or sensation.  Patient is right-hand dominant.  Patient rates pain as a 4/10.  Patient described pain as "aching".  No palliative measure for complaint.      Past Medical History:  Diagnosis Date  . Borderline diabetic   . GERD (gastroesophageal reflux disease)     Patient Active Problem List   Diagnosis Date Noted  . 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 Abnormal CT scan 01/21/2018    Past Surgical History:  Procedure Laterality Date  . CERVICAL FUSION      Prior to Admission medications   Medication Sig Start Date End Date Taking? Authorizing Provider  diclofenac Sodium (VOLTAREN) 1 % GEL Apply 2 g topically 4 (four) times daily. 10/24/19   Sable Feil, PA-C  diphenhydrAMINE (BENADRYL) 25 mg capsule Take 1 capsule (25 mg total) by mouth every 4 (four) hours as needed. 11/27/15 11/26/16  Duanne Guess, PA-C  furosemide (LASIX) 20 MG tablet Take 1 tablet (20 mg total) by mouth daily for 3 days. 12/01/18 12/04/18  Paulette Blanch, MD  ibuprofen (ADVIL,MOTRIN) 600 MG tablet Take 1 tablet (600 mg total) by mouth every 6 (six) hours as needed for moderate pain. 11/27/15   Duanne Guess, PA-C  Miconazole Nitrate (LOTRIMIN AF POWDER) 2 % AERP Apply 1 application topically 2 (two) times daily. 11/24/18   Frank Lot, MD  omeprazole (PRILOSEC) 20 MG capsule Take 20 mg by mouth daily.    [provider]  pravastatin (PRAVACHOL) 20 MG tablet Take 20 mg by mouth daily.    [provider]  traMADol  (ULTRAM) 50 MG tablet Take 1 tablet (50 mg total) by mouth every 6 (six) hours as needed. 07/04/18   Versie Starks, PA-C    Allergies Penicillins  No family history on file.  Social History Social History   Tobacco Use  . Smoking status: Former Research scientist (life sciences)  . Smokeless tobacco: Never Used  Vaping Use  . Vaping Use: Never used  Substance Use Topics  . Alcohol use: No  . Drug use: Never    Review of Systems Constitutional: No fever/chills Eyes: No visual changes. ENT: No sore throat. Cardiovascular: Denies chest pain. Respiratory: Denies shortness of breath. Gastrointestinal: No abdominal pain.  No nausea, no vomiting.  No diarrhea.  No constipation. Genitourinary: Negative for dysuria. Musculoskeletal: Left hand pain. Skin: Negative for rash. Neurological: Negative for headaches, focal weakness or numbness. Allergic/Immunilogical: Penicillin.  ____________________________________________   PHYSICAL EXAM:  VITAL SIGNS: ED Triage Vitals [10/24/19 0950]  Enc Vitals Group     BP      Pulse      Resp      Temp      Temp src      SpO2      Weight 186 lb 1.1 oz (84.4 kg)     Height 5\' 10"  (1.778 m)     Head Circumference      Peak Flow      Pain Score 4     Pain Loc  Pain Edu?      Excl. in [place]?    Constitutional: Alert and oriented. Well appearing and in no acute distress. Cardiovascular: Normal rate, regular rhythm. Grossly normal heart sounds.  Good peripheral circulation. Respiratory: Normal respiratory effort.  No retractions. Lungs CTAB. Gastrointestinal: Soft and nontender. No distention. No abdominal bruits. No CVA tenderness. Musculoskeletal: No obvious edema, erythema or deformity to the left hand. Neurologic:  Normal speech and language. No gross focal neurologic deficits are appreciated. No gait instability. Skin:  Skin is warm, dry and intact. No rash noted. Psychiatric: Mood and affect are normal. Speech and behavior are  normal.  ____________________________________________   LABS (all labs ordered are listed, but only abnormal results are displayed)  Labs Reviewed - No data to display ____________________________________________  EKG   ____________________________________________  RADIOLOGY  ED MD interpretation:    Official radiology report(s): DG Hand Complete Left  Result Date: 10/24/2019 CLINICAL DATA:  Left hand pain after fall 1 week ago EXAM: LEFT HAND - COMPLETE 3+ VIEW COMPARISON:  None. FINDINGS: No acute fracture. No dislocation. Moderate arthropathy of the third MCP joint with marginal erosions of the humeral head and prominent osteophytosis. Mild degenerative changes are seen throughout the interphalangeal joints of the hand as well as the first and second MCP joint and first Margaret joint. No abnormal soft tissue mineralization. No focal soft tissue swelling. IMPRESSION: 1. No acute fracture or dislocation. 2. Degenerative changes throughout the hand, most pronounced at the third MCP joint. Well-defined erosions of the third metacarpal head suggest underlying inflammatory or crystalline arthropathy, including gout. Electronically Signed   By: Davina Poke D.O.   On: 10/24/2019 10:54    ____________________________________________   PROCEDURES  Procedure(s) performed (including Critical Care):  Procedures   ____________________________________________   INITIAL IMPRESSION / ASSESSMENT AND PLAN / ED COURSE  As part of my medical decision making, I reviewed the following data within the [facility]      Patient presents with continued left hand pain secondary to a fall 1 week ago.  Discussed x-ray findings with patient consistent with degenerative changes of the hand.  Patient given discharge care instructions.  Patient given prescription for Voltaren gel and advised to follow-up with PCP.    Frank Buckley was evaluated in Emergency Department on 10/24/2019  for the symptoms described in the history of present illness. He was evaluated in the context of the global COVID-19 pandemic, which necessitated consideration that the patient might be at risk for infection with the SARS-CoV-2 virus that causes COVID-19. Institutional protocols and algorithms that pertain to the evaluation of patients at risk for COVID-19 are in a state of rapid change based on information released by regulatory bodies including the CDC and federal and state organizations. These policies and algorithms were followed during the patient's care in the ED.       ____________________________________________   FINAL CLINICAL IMPRESSION(S) / ED DIAGNOSES  Final diagnoses:  Left hand pain     ED Discharge Orders         Ordered    diclofenac Sodium (VOLTAREN) 1 % GEL  4 times daily     Discontinue  Reprint     10/24/19 1110           Note:  This document was prepared using Dragon voice recognition software and may include unintentional dictation errors.    Sable Feil, PA-C 10/24/19 1115    Carrie Mew, MD 10/24/19 1459

## 2019-10-24 NOTE — Discharge Instructions (Addendum)
Your x-rays were negative for fracture.  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  Follow discharge care instructions and apply medication as directed.

## 2019-11-01 ENCOUNTER — Ambulatory Visit: Payer: Medicare HMO

## 2019-11-12 ENCOUNTER — Other Ambulatory Visit: Payer: Self-pay

## 2019-11-12 ENCOUNTER — Ambulatory Visit
Admission: RE | Admit: 2019-11-12 | Discharge: 2019-11-12 | Disposition: A | Discharge status: Home / Self Care | Payer: Medicare HMO | Source: Ambulatory Visit | Attending: Family Medicine | Admitting: Family Medicine

## 2019-11-12 DIAGNOSIS — R109 Unspecified abdominal pain: Secondary | ICD-10-CM | POA: Insufficient documentation

## 2019-11-12 LAB — POCT I-STAT CREATININE: Creatinine, Ser: 0.8 mg/dL (ref 0.61–1.24)

## 2019-11-12 MED ORDER — IOHEXOL 300 MG/ML  SOLN
100.0000 mL | Freq: Once | INTRAMUSCULAR | Status: AC | PRN
  Administered 2019-11-12: 100 mL via INTRAVENOUS

## 2019-11-15 ENCOUNTER — Telehealth: Payer: Self-pay | Admitting: *Deleted

## 2019-11-15 NOTE — Telephone Encounter (Signed)
Dr Lennox Grumbles wanted Dr Grayland Ormond to be aware of results of CT she ordered on patient that showed "Vascular/Lymphatic: Aorto bi-iliac atherosclerosis. No aortic aneurysm. Patent portal vein. No acute vascular findings. Similar small retroperitoneal lymph nodes are likely reactive. There is a prominent 10 mm right external iliac node, series 2, image 79."

## 2019-11-15 NOTE — Telephone Encounter (Signed)
Ok, thank you

## 2019-12-15 NOTE — Progress Notes (Signed)
[place]  [phone] [phone]  ID: Frank Buckley OB: 24-Feb-1946  MR#: [id number]  UUV#:[id number]  Patient Care Team: Marguerita Merles, MD as PCP - General (Family Medicine)  CHIEF COMPLAINT: MGUS, pulmonary nodules.  INTERVAL HISTORY: Patient returns to clinic today for routine 56-monthevaluation and discussion of his imaging and laboratory results.  He currently feels well and is asymptomatic. He has no neurologic complaints.  He denies any recent fevers or illnesses.  He has a good appetite and denies weight loss.  He denies any chest pain, shortness of breath, cough, or hemoptysis.  He denies any nausea, vomiting, constipation, or diarrhea.  He has no urinary complaints.  Patient feels at his baseline offers no specific complaints today.  REVIEW OF SYSTEMS:   Review of Systems  Constitutional: Negative.  Negative for fever, malaise/fatigue and weight loss.  Respiratory: Negative.  Negative for cough, hemoptysis and shortness of breath.   Cardiovascular: Negative.  Negative for chest pain and leg swelling.  Gastrointestinal: Negative.  Negative for abdominal pain.  Genitourinary: Negative.  Negative for hematuria.  Musculoskeletal: Negative.  Negative for back pain and joint pain.  Skin: Negative.  Negative for rash.  Neurological: Negative.  Negative for focal weakness, weakness and headaches.  Psychiatric/Behavioral: Negative.  The patient is not nervous/anxious.     As per HPI. Otherwise, a complete review of systems is negative.  PAST MEDICAL HISTORY: Past Medical History:  Diagnosis Date  . Borderline diabetic   . Cancer ([place]   . GERD (gastroesophageal reflux disease)     PAST SURGICAL HISTORY: Past Surgical History:  Procedure Laterality Date  . CERVICAL FUSION      FAMILY HISTORY: History reviewed. No pertinent family history.  ADVANCED DIRECTIVES (Y/N):  N  HEALTH MAINTENANCE: Social History   Tobacco Use  .  Smoking status: Former SResearch scientist (life sciences) . Smokeless tobacco: Never Used  Vaping Use  . Vaping Use: Never used  Substance Use Topics  . Alcohol use: No  . Drug use: Never     Colonoscopy:  PAP:  Bone density:  Lipid panel:  Allergies  Allergen Reactions  . Penicillins Rash    Current Outpatient Medications  Medication Sig Dispense Refill  . diclofenac Sodium (VOLTAREN) 1 % GEL Apply 2 g topically 4 (four) times daily. 150 g 1  . diphenhydrAMINE (BENADRYL) 25 mg capsule Take 1 capsule (25 mg total) by mouth every 4 (four) hours as needed. 30 capsule 0  . furosemide (LASIX) 20 MG tablet Take 1 tablet (20 mg total) by mouth daily for 3 days. 3 tablet 0  . ibuprofen (ADVIL,MOTRIN) 600 MG tablet Take 1 tablet (600 mg total) by mouth every 6 (six) hours as needed for moderate pain. 30 tablet 0  . Miconazole Nitrate (LOTRIMIN AF POWDER) 2 % AERP Apply 1 application topically 2 (two) times daily. 85 g 0  . omeprazole (PRILOSEC) 20 MG capsule Take 20 mg by mouth daily.    . pravastatin (PRAVACHOL) 20 MG tablet Take 20 mg by mouth daily.    . traMADol (ULTRAM) 50 MG tablet Take 1 tablet (50 mg total) by mouth every 6 (six) hours as needed. 15 tablet 0   No current facility-administered medications for this visit.    OBJECTIVE: Vitals:   12/20/19 1034  BP: 112/77  Pulse: (!) 57  Resp: 20  Temp: 97.6 F (36.4 C)  SpO2: 97%     Body mass index is 24.75 kg/m.  ECOG FS:0 - Asymptomatic  General: Well-developed, well-nourished, no acute distress.  Sitting in a wheelchair. Eyes: Pink conjunctiva, anicteric sclera. HEENT: Normocephalic, moist mucous membranes. Lungs: No audible wheezing or coughing. Heart: Regular rate and rhythm. Abdomen: Soft, nontender, no obvious distention. Musculoskeletal: No edema, cyanosis, or clubbing. Neuro: Alert, answering all questions appropriately. Cranial nerves grossly intact. Skin: No rashes or petechiae noted. Psych: Normal affect.   LAB  RESULTS:  Lab Results  Component Value Date   NA 137 12/18/2019   K 3.7 12/18/2019   CL 103 12/18/2019   CO2 27 12/18/2019   GLUCOSE 93 12/18/2019   BUN 12 12/18/2019   CREATININE 0.74 12/18/2019   CALCIUM 8.9 12/18/2019   PROT 6.7 11/24/2018   ALBUMIN 3.5 11/24/2018   AST 25 11/24/2018   ALT 22 11/24/2018   ALKPHOS 59 11/24/2018   BILITOT 0.7 11/24/2018   GFRNONAA >60 12/18/2019   GFRAA >60 12/18/2019    Lab Results  Component Value Date   WBC 4.9 12/18/2019   NEUTROABS 2.7 12/18/2019   HGB 11.8 (L) 12/18/2019   HCT 34.5 (L) 12/18/2019   MCV 91.0 12/18/2019   PLT 214 12/18/2019   Lab Results  Component Value Date   TOTALPROTELP 7.0 12/18/2019   ALBUMINELP 3.7 12/18/2019   A1GS 0.2 12/18/2019   A2GS 0.6 12/18/2019   BETS 0.9 12/18/2019   GAMS 1.7 12/18/2019   MSPIKE 0.7 (H) 12/18/2019   SPEI Comment 12/18/2019     STUDIES: CT Chest W Contrast  Result Date: 12/18/2019 CLINICAL DATA:  Follow-up MGUS. Patient with bone lesions and RIGHT lower lobe nodule. EXAM: CT CHEST WITH CONTRAST TECHNIQUE: Multidetector CT imaging of the chest was performed during intravenous contrast administration. CONTRAST:  33m OMNIPAQUE IOHEXOL 300 MG/ML  SOLN COMPARISON:  June 12, 2019 FINDINGS: Cardiovascular: Normal heart size without pericardial effusion. CT appearance of central pulmonary vasculature is unremarkable. Scattered atheromatous plaque both calcified and noncalcified in the thoracic aorta similar to the prior exam. Mediastinum/Nodes: Thoracic inlet structures are normal. No axillary lymphadenopathy. No mediastinal lymphadenopathy. No hilar lymphadenopathy. Lungs/Pleura: RIGHT upper lobe pulmonary nodule measuring approximately 1.8 x 1.2 cm. There is some grouped nodularity in the area. Previous size approximately 1.8 x 1.1 cm (image 81, series 3) Nodule along the major fissure in the superior segment of the RIGHT lower lobe distorts the major fissure and abuts the pleural  surface in the medial chest (image 59, series 3) 11 x 9 mm as compared to 11 x 9 mm. No new pulmonary nodule.  Airways are patent. Upper Abdomen: No suspicious focal lesion in the liver. Liver incompletely imaged with RIGHT hepatic hemangioma as before. No acute upper abdominal process adrenal glands are normal. Musculoskeletal: No acute musculoskeletal process. Spinal degenerative changes. IMPRESSION: 1. 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 There is very slow enlargement of the RIGHT upper lobe process over time when compared to the study of January of 2020. 2. Superior segment of RIGHT lower lobe may also show very slight enlargement, when measured in the sagittal plane on the current study this measures approximately 1.4 cm craniocaudal extent as compared to 1.1 cm on the previous exam. 3. Continued discussion in a multi disciplinary thoracic oncologic setting may be helpful with PET or biopsy as warranted. 4. No new pulmonary nodule. 5. Aortic atherosclerosis. Aortic Atherosclerosis (ICD10-I70.0). Electronically Signed   By: GZetta BillsM.D.   On:  12/18/2019 13:22    ASSESSMENT: MGUS, pulmonary nodules.  PLAN:    1. MGUS: Bone marrow biopsy completed on March 13, 2018 revealed a slightly hypercellular bone marrow with trilineage hematopoiesis.  Patient had 5% plasma cells.  Cytogenetic studies were normal.  Metastatic bone survey on February 02, 2018 revealed widespread osseous lesions of unclear etiology.  Patient's most recent M spike is 0.7 which is essentially unchanged from 6 months prior.  He continues to have a mild IgM predominance and elevated kappa/lambda light chain ratio both of which are unchanged as well.  He has no evidence of endorgan damage.  Previously, his PSA was also within normal limits.  No intervention is needed.  Return to clinic in 6 months with repeat laboratory work and  further evaluation.   2.  Pulmonary nodules: CT scan results from December 18, 2019 reviewed independently and reported as above with the lesion in his right upper lobe essentially unchanged at 1.8 x 1.2 cm.  Lesion in his right lower lobe is also unchanged in size.  Previously, PET scan results from March 14, 2019 did not reveal any significant hypermetabolism.  Continue to monitor closely.  Repeat imaging in 6 months with follow-up as above. 3.  Osseous lesions: Per metastatic bone survey in November 2019.  Given results of bone marrow biopsy and a normal PSA, this is not myeloma or prostate cancer.  PET scan is negative.  I spent a total of 20 minutes reviewing chart data, face-to-face evaluation with the patient, counseling and coordination of care as detailed above.    Patient expressed understanding and was in agreement with this plan. He also understands that He can call clinic at any time with any questions, concerns, or complaints.    Lloyd Huger, MD   12/21/2019 8:49 AM

## 2019-12-17 ENCOUNTER — Telehealth: Payer: Self-pay | Admitting: Oncology

## 2019-12-17 NOTE — Telephone Encounter (Signed)
Received VM from patient about scheduled CT and follow-up appts. I attempted to give patient the appt details, but he seemed confused. I explained multiple times and stated he understood.

## 2019-12-18 ENCOUNTER — Other Ambulatory Visit: Payer: Self-pay

## 2019-12-18 ENCOUNTER — Inpatient Hospital Stay: Payer: Medicare HMO | Attending: Oncology

## 2019-12-18 ENCOUNTER — Ambulatory Visit
Admission: RE | Admit: 2019-12-18 | Discharge: 2019-12-18 | Disposition: A | Discharge status: Home / Self Care | Payer: Medicare HMO | Source: Ambulatory Visit | Attending: Oncology | Admitting: Oncology

## 2019-12-18 DIAGNOSIS — K219 Gastro-esophageal reflux disease without esophagitis: Secondary | ICD-10-CM | POA: Diagnosis not present

## 2019-12-18 DIAGNOSIS — D472 Monoclonal gammopathy: Secondary | ICD-10-CM | POA: Insufficient documentation

## 2019-12-18 DIAGNOSIS — R918 Other nonspecific abnormal finding of lung field: Secondary | ICD-10-CM | POA: Insufficient documentation

## 2019-12-18 DIAGNOSIS — Z87891 Personal history of nicotine dependence: Secondary | ICD-10-CM | POA: Diagnosis not present

## 2019-12-18 DIAGNOSIS — R911 Solitary pulmonary nodule: Secondary | ICD-10-CM

## 2019-12-18 HISTORY — DX: Malignant (primary) neoplasm, unspecified: C80.1

## 2019-12-18 LAB — BASIC METABOLIC PANEL
Anion gap: 7 (ref 5–15)
BUN: 12 mg/dL (ref 8–23)
CO2: 27 mmol/L (ref 22–32)
Calcium: 8.9 mg/dL (ref 8.9–10.3)
Chloride: 103 mmol/L (ref 98–111)
Creatinine, Ser: 0.74 mg/dL (ref 0.61–1.24)
GFR calc Af Amer: >60 mL/min (ref >60)
GFR calc non Af Amer: >60 mL/min (ref >60)
Glucose, Bld: 93 mg/dL (ref 70–99)
Potassium: 3.7 mmol/L (ref 3.5–5.1)
Sodium: 137 mmol/L (ref 135–145)

## 2019-12-18 LAB — CBC WITH DIFFERENTIAL/PLATELET
Abs Immature Granulocytes: 0 10*3/uL (ref 0.00–0.07)
Basophils Absolute: 0 10*3/uL (ref 0.0–0.1)
Basophils Relative: 1 %
Eosinophils Absolute: 0.3 10*3/uL (ref 0.0–0.5)
Eosinophils Relative: 6 %
HCT: 34.5 % — ABNORMAL LOW (ref 39.0–52.0)
Hemoglobin: 11.8 g/dL — ABNORMAL LOW (ref 13.0–17.0)
Immature Granulocytes: 0 %
Lymphocytes Relative: 28 %
Lymphs Abs: 1.4 10*3/uL (ref 0.7–4.0)
MCH: 31.1 pg (ref 26.0–34.0)
MCHC: 34.2 g/dL (ref 30.0–36.0)
MCV: 91 fL (ref 80.0–100.0)
Monocytes Absolute: 0.4 10*3/uL (ref 0.1–1.0)
Monocytes Relative: 8 %
Neutro Abs: 2.7 10*3/uL (ref 1.7–7.7)
Neutrophils Relative %: 57 %
Platelets: 214 10*3/uL (ref 150–400)
RBC: 3.79 MIL/uL — ABNORMAL LOW (ref 4.22–5.81)
RDW: 12.8 % (ref 11.5–15.5)
WBC: 4.9 10*3/uL (ref 4.0–10.5)
nRBC: 0 % (ref 0.0–0.2)

## 2019-12-18 MED ORDER — IOHEXOL 300 MG/ML  SOLN
75.0000 mL | Freq: Once | INTRAMUSCULAR | Status: AC | PRN
  Administered 2019-12-18: 75 mL via INTRAVENOUS

## 2019-12-19 LAB — PROTEIN ELECTROPHORESIS, SERUM
A/G Ratio: 1.1 (ref 0.7–1.7)
Albumin ELP: 3.7 g/dL (ref 2.9–4.4)
Alpha-1-Globulin: 0.2 g/dL (ref 0.0–0.4)
Alpha-2-Globulin: 0.6 g/dL (ref 0.4–1.0)
Beta Globulin: 0.9 g/dL (ref 0.7–1.3)
Gamma Globulin: 1.7 g/dL (ref 0.4–1.8)
Globulin, Total: 3.3 g/dL (ref 2.2–3.9)
M-Spike, %: 0.7 g/dL — ABNORMAL HIGH
Total Protein ELP: 7 g/dL (ref 6.0–8.5)

## 2019-12-19 LAB — KAPPA/LAMBDA LIGHT CHAINS
Kappa free light chain: 21.8 mg/L — ABNORMAL HIGH (ref 3.3–19.4)
Kappa, lambda light chain ratio: 2.1 — ABNORMAL HIGH (ref 0.26–1.65)
Lambda free light chains: 10.4 mg/L (ref 5.7–26.3)

## 2019-12-19 LAB — IGG, IGA, IGM
IgA: 101 mg/dL (ref 61–437)
IgG (Immunoglobin G), Serum: 1608 mg/dL (ref 603–1613)
IgM (Immunoglobulin M), Srm: 259 mg/dL — ABNORMAL HIGH (ref 15–143)

## 2019-12-20 ENCOUNTER — Inpatient Hospital Stay (HOSPITAL_BASED_OUTPATIENT_CLINIC_OR_DEPARTMENT_OTHER): Payer: Medicare HMO | Admitting: Oncology

## 2019-12-20 ENCOUNTER — Encounter: Payer: Self-pay | Admitting: Oncology

## 2019-12-20 ENCOUNTER — Other Ambulatory Visit: Payer: Self-pay

## 2019-12-20 VITALS — BP 112/77 | HR 57 | Temp 97.6°F | Resp 20 | Wt 172.5 lb

## 2019-12-20 DIAGNOSIS — D472 Monoclonal gammopathy: Secondary | ICD-10-CM | POA: Diagnosis not present

## 2019-12-20 DIAGNOSIS — R911 Solitary pulmonary nodule: Secondary | ICD-10-CM | POA: Diagnosis not present

## 2019-12-20 NOTE — Progress Notes (Signed)
Patient denies any pain or concerns at this time.  

## 2020-02-27 ENCOUNTER — Emergency Department: Payer: Medicare HMO

## 2020-02-27 ENCOUNTER — Emergency Department
Admission: EM | Admit: 2020-02-27 | Discharge: 2020-02-27 | Disposition: A | Discharge status: Home / Self Care | Payer: Medicare HMO | Attending: Emergency Medicine | Admitting: Emergency Medicine

## 2020-02-27 ENCOUNTER — Encounter: Payer: Self-pay | Admitting: Intensive Care

## 2020-02-27 DIAGNOSIS — Z87891 Personal history of nicotine dependence: Secondary | ICD-10-CM | POA: Insufficient documentation

## 2020-02-27 DIAGNOSIS — S2241XA Multiple fractures of ribs, right side, initial encounter for closed fracture: Secondary | ICD-10-CM | POA: Diagnosis not present

## 2020-02-27 DIAGNOSIS — W010XXA Fall on same level from slipping, tripping and stumbling without subsequent striking against object, initial encounter: Secondary | ICD-10-CM | POA: Diagnosis not present

## 2020-02-27 DIAGNOSIS — Z7901 Long term (current) use of anticoagulants: Secondary | ICD-10-CM | POA: Diagnosis not present

## 2020-02-27 DIAGNOSIS — Z859 Personal history of malignant neoplasm, unspecified: Secondary | ICD-10-CM | POA: Insufficient documentation

## 2020-02-27 DIAGNOSIS — R1011 Right upper quadrant pain: Secondary | ICD-10-CM | POA: Diagnosis not present

## 2020-02-27 DIAGNOSIS — R918 Other nonspecific abnormal finding of lung field: Secondary | ICD-10-CM | POA: Insufficient documentation

## 2020-02-27 DIAGNOSIS — T1490XA Injury, unspecified, initial encounter: Secondary | ICD-10-CM

## 2020-02-27 DIAGNOSIS — S29091A Other injury of muscle and tendon of front wall of thorax, initial encounter: Secondary | ICD-10-CM | POA: Diagnosis present

## 2020-02-27 LAB — URINALYSIS, COMPLETE (UACMP) WITH MICROSCOPIC
Bacteria, UA: NONE SEEN
Bilirubin Urine: NEGATIVE
Glucose, UA: NEGATIVE mg/dL
Hgb urine dipstick: NEGATIVE
Ketones, ur: NEGATIVE mg/dL
Leukocytes,Ua: NEGATIVE
Nitrite: NEGATIVE
Protein, ur: NEGATIVE mg/dL
Specific Gravity, Urine: 1.014 (ref 1.005–1.030)
WBC, UA: NONE SEEN WBC/hpf (ref 0–5)
pH: 7 (ref 5.0–8.0)

## 2020-02-27 LAB — COMPREHENSIVE METABOLIC PANEL
ALT: 16 U/L (ref 0–44)
AST: 22 U/L (ref 15–41)
Albumin: 3.7 g/dL (ref 3.5–5.0)
Alkaline Phosphatase: 70 U/L (ref 38–126)
Anion gap: 7 (ref 5–15)
BUN: 10 mg/dL (ref 8–23)
CO2: 28 mmol/L (ref 22–32)
Calcium: 8.8 mg/dL — ABNORMAL LOW (ref 8.9–10.3)
Chloride: 104 mmol/L (ref 98–111)
Creatinine, Ser: 0.74 mg/dL (ref 0.61–1.24)
GFR, Estimated: >60 mL/min (ref >60)
Glucose, Bld: 100 mg/dL — ABNORMAL HIGH (ref 70–99)
Potassium: 4.1 mmol/L (ref 3.5–5.1)
Sodium: 139 mmol/L (ref 135–145)
Total Bilirubin: 0.6 mg/dL (ref 0.3–1.2)
Total Protein: 7.3 g/dL (ref 6.5–8.1)

## 2020-02-27 LAB — CBC WITH DIFFERENTIAL/PLATELET
Abs Immature Granulocytes: 0.01 10*3/uL (ref 0.00–0.07)
Basophils Absolute: 0.1 10*3/uL (ref 0.0–0.1)
Basophils Relative: 1 %
Eosinophils Absolute: 0.5 10*3/uL (ref 0.0–0.5)
Eosinophils Relative: 8 %
HCT: 35.9 % — ABNORMAL LOW (ref 39.0–52.0)
Hemoglobin: 12.2 g/dL — ABNORMAL LOW (ref 13.0–17.0)
Immature Granulocytes: 0 %
Lymphocytes Relative: 32 %
Lymphs Abs: 2 10*3/uL (ref 0.7–4.0)
MCH: 31.8 pg (ref 26.0–34.0)
MCHC: 34 g/dL (ref 30.0–36.0)
MCV: 93.5 fL (ref 80.0–100.0)
Monocytes Absolute: 0.5 10*3/uL (ref 0.1–1.0)
Monocytes Relative: 9 %
Neutro Abs: 3.1 10*3/uL (ref 1.7–7.7)
Neutrophils Relative %: 50 %
Platelets: 255 10*3/uL (ref 150–400)
RBC: 3.84 MIL/uL — ABNORMAL LOW (ref 4.22–5.81)
RDW: 12.7 % (ref 11.5–15.5)
WBC: 6.1 10*3/uL (ref 4.0–10.5)
nRBC: 0 % (ref 0.0–0.2)

## 2020-02-27 LAB — TYPE AND SCREEN
ABO/RH(D): AB POS
Antibody Screen: NEGATIVE

## 2020-02-27 MED ORDER — IOHEXOL 300 MG/ML  SOLN
100.0000 mL | Freq: Once | INTRAMUSCULAR | Status: AC | PRN
  Administered 2020-02-27: 100 mL via INTRAVENOUS
  Filled 2020-02-27: qty 100

## 2020-02-27 MED ORDER — HYDROCODONE-ACETAMINOPHEN 5-325 MG PO TABS: 1.0000 | ORAL_TABLET | ORAL | 0 refills | Status: DC | PRN

## 2020-02-27 MED ORDER — SODIUM CHLORIDE 0.9 % IV BOLUS
1000.0000 mL | Freq: Once | INTRAVENOUS | Status: AC
  Administered 2020-02-27: 1000 mL via INTRAVENOUS

## 2020-02-27 NOTE — ED Provider Notes (Signed)
[facility] Emergency Department Provider Note  ____________________________________________  Time seen: Approximately 3:06 PM  I have reviewed the triage vital signs and the nursing notes.   HISTORY  Chief Complaint Flank Pain    HPI Frank Buckley is a 74 y.o. male who presents the emergency department complaining of right inferior rib and right upper quadrant abdominal pain.  Patient states that he had gotten up during the middle of the night 5 to 7 days ago, was going to get something to drink when he tripped and fell.  Patient states that he struck the right side of his abdomen and chest against a wooden chair.  He reports that he felt like the arm of the chair "pushed into his stomach."  Patient states that he has had ongoing pain.  Patient had bruising to the right upper quadrant initially but states that the bruising is fading currently.  Patient continues to complain of pain.  He denies any shortness of breath or pleuritic pain but states that when he moves, takes in a deep breath, palpates the right inferior ribs and right upper quadrant he has pain.  No hematuria.  No blood in the stools.  Patient has had no constipation or diarrhea.         Past Medical History:  Diagnosis Date  . Borderline diabetic   . Cancer ([place])   . GERD (gastroesophageal reflux disease)     Patient Active Problem List   Diagnosis Date Noted  . 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 Abnormal CT scan 01/21/2018    Past Surgical History:  Procedure Laterality Date  . CERVICAL FUSION      Prior to Admission medications   Medication Sig Start Date End Date Taking? Authorizing Provider  diclofenac Sodium (VOLTAREN) 1 % GEL Apply 2 g topically 4 (four) times daily. 10/24/19   Sable Feil, PA-C  diphenhydrAMINE (BENADRYL) 25 mg capsule Take 1 capsule (25 mg total) by mouth every 4 (four) hours  as needed. 11/27/15 12/20/19  Duanne Guess, PA-C  furosemide (LASIX) 20 MG tablet Take 1 tablet (20 mg total) by mouth daily for 3 days. 12/01/18 12/20/19  Paulette Blanch, MD  HYDROcodone-acetaminophen (NORCO/VICODIN) 5-325 MG tablet Take 1 tablet by mouth every 4 (four) hours as needed for moderate pain. 02/27/20   Sun Kihn, Charline Bills, PA-C  ibuprofen (ADVIL,MOTRIN) 600 MG tablet Take 1 tablet (600 mg total) by mouth every 6 (six) hours as needed for moderate pain. 11/27/15   Duanne Guess, PA-C  Miconazole Nitrate (LOTRIMIN AF POWDER) 2 % AERP Apply 1 application topically 2 (two) times daily. 11/24/18   Merlyn Lot, MD  omeprazole (PRILOSEC) 20 MG capsule Take 20 mg by mouth daily.    [provider]  pravastatin (PRAVACHOL) 20 MG tablet Take 20 mg by mouth daily.    [provider]  traMADol (ULTRAM) 50 MG tablet Take 1 tablet (50 mg total) by mouth every 6 (six) hours as needed. 07/04/18   Versie Starks, PA-C    Allergies Penicillins  History reviewed. No pertinent family history.  Social History Social History   Tobacco Use  . Smoking status: Former Research scientist (life sciences)  . Smokeless tobacco: Never Used  Vaping Use  . Vaping Use: Never used  Substance Use Topics  . Alcohol use: No  . Drug use: Never     Review of Systems  Constitutional: No fever/chills  Eyes: No visual changes. No discharge ENT: No upper respiratory complaints. Cardiovascular: no chest pain. Respiratory: no cough. No SOB. Gastrointestinal: Abdominal trauma to the right upper quadrant, right upper quadrant pain. No nausea, no vomiting.  No diarrhea.  No constipation. Genitourinary: Negative for dysuria. No hematuria Musculoskeletal: Positive for right anterolateral inferior rib cage pain from a fall Skin: Negative for rash, abrasions, lacerations, ecchymosis. Neurological: Negative for headaches, focal weakness or numbness.  10 System ROS otherwise  negative.  ____________________________________________   PHYSICAL EXAM:  VITAL SIGNS: ED Triage Vitals  Enc Vitals Group     BP 02/27/20 1417 131/74     Pulse Rate 02/27/20 1417 71     Resp 02/27/20 1417 16     Temp 02/27/20 1417 98.9 F (37.2 C)     Temp Source 02/27/20 1417 Oral     SpO2 02/27/20 1417 99 %     Weight 02/27/20 1416 175 lb (79.4 kg)     Height 02/27/20 1416 5\' 9"  (1.753 m)     Head Circumference --      Peak Flow --      Pain Score 02/27/20 1417 7     Pain Loc --      Pain Edu? --      Excl. in [place]? --      Constitutional: Alert and oriented. Well appearing and in no acute distress. Eyes: Conjunctivae are normal. PERRL. EOMI. Head: Atraumatic. ENT:      Ears:       Nose: No congestion/rhinnorhea.      Mouth/Throat: Mucous membranes are moist.  Neck: No stridor.  No cervical spine tenderness to palpation.  Cardiovascular: Normal rate, regular rhythm. Normal S1 and S2.  Good peripheral circulation. Respiratory: Normal respiratory effort without tachypnea or retractions. Lungs CTAB. Good air entry to the bases with no decreased or absent breath sounds. Gastrointestinal: Visualization of the right anterior abdominal wall reveals no abrasions, lacerations, ecchymosis. Patient states initially he had ecchymosis but it has resolved. Bowel sounds 4 quadrants. Soft to palpation all quadrants. Patient is tender in the right upper quadrant with no other tenderness. Mild guarding in the right upper quadrant but no other guarding. No rigidity. No palpable masses. No distention. No CVA tenderness. Musculoskeletal: Full range of motion to all extremities. No gross deformities appreciated. Neurologic:  Normal speech and language. No gross focal neurologic deficits are appreciated.  Skin:  Skin is warm, dry and intact. No rash noted. Psychiatric: Mood and affect are normal. Speech and behavior are normal. Patient exhibits appropriate insight and  judgement.   ____________________________________________   LABS (all labs ordered are listed, but only abnormal results are displayed)  Labs Reviewed  COMPREHENSIVE METABOLIC PANEL - Abnormal; Notable for the following components:      Result Value   Glucose, Bld 100 (*)    Calcium 8.8 (*)    All other components within normal limits  CBC WITH DIFFERENTIAL/PLATELET - Abnormal; Notable for the following components:   RBC 3.84 (*)    Hemoglobin 12.2 (*)    HCT 35.9 (*)    All other components within normal limits  URINALYSIS, COMPLETE (UACMP) WITH MICROSCOPIC - Abnormal; Notable for the following components:   Color, Urine STRAW (*)    APPearance CLEAR (*)    All other components within normal limits  TYPE AND SCREEN   ____________________________________________  EKG   ____________________________________________  RADIOLOGY I personally viewed and evaluated these images as part of my medical decision making, as  well as reviewing the written report by the radiologist.  ED Provider Interpretation: CT scan of chest, abdomen and pelvis revealed 2 nondisplaced rib fractures with no underlying pneumothorax.  Small amount of bleeding from the chest wall musculature.  No large hematoma.  No other traumatic findings.  Patient has 2 nodules/masses that appear to be enlarging from previous imaging.  CT CHEST ABDOMEN PELVIS W CONTRAST  Result Date: 02/27/2020 CLINICAL DATA:  [place]. Right chest and abdominal pain. MGUS. Right lung nodule. EXAM: CT CHEST, ABDOMEN, AND PELVIS WITH CONTRAST TECHNIQUE: Multidetector CT imaging of the chest, abdomen and pelvis was performed following the standard protocol during bolus administration of intravenous contrast. CONTRAST:  164mL OMNIPAQUE IOHEXOL 300 MG/ML  SOLN COMPARISON:  12/18/2019 CT chest. 11/12/2019 CT abdomen. 06/12/2019 CT chest. 03/14/2019 PET scan. Other chest CT studies as distant as 05/02/2018 FINDINGS: CT CHEST FINDINGS Cardiovascular:  Heart size is normal. No visible coronary artery calcification. Near complete absence of aortic atherosclerosis. Pulmonary arteries appear unremarkable. Mediastinum/Nodes: No mediastinal or hilar mass or lymphadenopathy. Lungs/Pleura: Minimal emphysematous change at the lung apices. No pneumothorax or hemothorax. No evidence of pneumonia or collapse. Pulmonary nodules or masses in the anterior aspect of the right upper lobe and in the apical segment of the right lower lobe are again demonstrated. Particularly when compared to the older studies, there appears to be slow growth of both lesions. Since January of 2020, right lower lobe superior segment focus has enlarged from 8 x 10 mm to 10 x 13 mm. Anterior right upper lobe abnormality is more difficult to measure precisely because of its irregular nature. The overall size has probably increased from 12 x 18 mm 213 x 19 mm, but internal density appears to be increasing somewhat and some of the margins are filling out slightly. Therefore, these continue to be viewed with suspicion. Musculoskeletal: There are nondisplaced fractures of the right tenth and eleventh ribs laterally. Mild bleeding or edema in the chest wall musculature. No thoracic spinal fracture. CT ABDOMEN PELVIS FINDINGS Hepatobiliary: No traumatic finding. May angioma along the posterior margin of the right lobe measuring approximately 3 cm in diameter is unchanged. Few tiny scattered low densities are unchanged. Pancreas: Normal Spleen: Normal Adrenals/Urinary Tract: Adrenal glands are normal. Kidneys are normal. Bladder is normal. Stomach/Bowel: Stomach and small intestine are normal. Normal appendix. Sigmoid diverticulosis without visible diverticulitis. Vascular/Lymphatic: Aortic atherosclerosis. No aneurysm. IVC is normal. No retroperitoneal adenopathy. Reproductive: Normal Other: No free fluid or air. Musculoskeletal: Extensive chronic lumbar degenerative changes. No acute traumatic finding in the  lumbar region. IMPRESSION: 1. Nondisplaced fractures of the right tenth and eleventh ribs laterally. Mild bleeding or edema in the chest wall musculature. No pneumothorax or hemothorax. No traumatic liver finding. 2. Pulmonary nodules or masses in the anterior aspect of the right upper lobe and in the apical segment of the right lower lobe are again demonstrated. Particularly when compared to the older studies, there appears to be slow growth of both lesions when compared to the older studies. Anterior right upper lobe abnormality is more difficult to measure precisely because of its irregular nature. The overall size has probably increased from 12 x 18 mm to 13 x 19 mm, but internal density appears to be increasing somewhat and some of the margins are filling out. Therefore, these continue to be viewed with suspicion. As recommended on the immediate previous chest CT, continued discussion in a multi disc plane area thoracic Oncology setting may be helpful, with PET or biopsy  as warranted. 3. No other acute or traumatic finding in the chest, abdomen or pelvis. 4. Aortic atherosclerosis. Aortic Atherosclerosis (ICD10-I70.0). Electronically Signed   By: Nelson Chimes M.D.   On: 02/27/2020 17:00    ____________________________________________    PROCEDURES  Procedure(s) performed:    Procedures    Medications  sodium chloride 0.9 % bolus 1,000 mL (0 mLs Intravenous Stopped 02/27/20 1735)  iohexol (OMNIPAQUE) 300 MG/ML solution 100 mL (100 mLs Intravenous Contrast Given 02/27/20 1627)     ____________________________________________   INITIAL IMPRESSION / ASSESSMENT AND PLAN / ED COURSE  Pertinent labs & imaging results that were available during my care of the patient were reviewed by me and considered in my medical decision making (see chart for details).  Review of the [place] CSRS was performed in accordance of the [place] prior to dispensing any controlled drugs.           Patient's  diagnosis is consistent with fall, rib fracture, pulmonary mass.  Patient presented to the emergency department complaining of anterolateral rib pain, right upper quadrant abdominal pain following a fall.  Patient was tender over the ribs as well as the right upper quadrant.  Imaging revealed that he sustained to nondisplaced rib fractures, no evidence of pneumothorax.  Patient does have some mild bleeding in the musculature of the chest wall but no large hematoma.  There is no external ecchymosis at this time that there was previously.  Patient is mildly anemic at 12.2, but on review of patient's medical records, last CBC from 2 months ago revealed that patient's previous hemoglobin was 11.8.  Given reassuring amount of bleeding on CT, no intra-abdominal or anterior chest cavity bleeding as well as stable hemoglobin I do not feel that patient has lost any significant amount of blood.  At this time patient is stable from his trauma and stable for discharge.  Patient does have 2 masses/nodules identified on CT that appear to be enlarging from previous imaging.  Patient has been followed by oncology, states that he was supposed to have imaging, specifically a CT scan, in a week's time.  I recommended they talk to oncology to see if they want to pursue additional imaging as radiology feels that PET scan may be warranted.  Patient will follow up with oncology for this finding.  Patient will be prescribed limited amount of Vicodin for his rib fractures.  Again follow-up with oncology for pulmonary findings, primary care as needed.. Patient is given ED precautions to return to the ED for any worsening or new symptoms.     ____________________________________________  FINAL CLINICAL IMPRESSION(S) / ED DIAGNOSES  Final diagnoses:  Trauma  Closed fracture of multiple ribs of right side, initial encounter  Pulmonary mass      NEW MEDICATIONS STARTED DURING THIS VISIT:  ED Discharge Orders         Ordered     HYDROcodone-acetaminophen (NORCO/VICODIN) 5-325 MG tablet  Every 4 hours PRN        02/27/20 1755              This chart was dictated using voice recognition software/Dragon. Despite best efforts to proofread, errors can occur which can change the meaning. Any change was purely unintentional.    Darletta Moll, PA-C 02/27/20 1755    Delman Kitten, MD 02/27/20 [phone]

## 2020-02-27 NOTE — ED Triage Notes (Signed)
Patient reports mechanical fall last week and having right sided rib cage pain. Ambulatory with cane in triage

## 2020-03-12 NOTE — Progress Notes (Signed)
03/13/2020 3:59 PM   Frank Buckley 02-Oct-1945 [id number]  Referring provider: Marguerita Merles, [address] [place],   [id number]  Chief Complaint  Patient presents with  . Benign Prostatic Hypertrophy    HPI: Patient is a 74 -year-old male who a high risk hematuria who presents today for follow up.  History of hematuria (high risk) Former smoker.  CTU 12/2017 Possible left nephrolithiasis. No other explanation for hematuria.   Aortic Atherosclerosis (ICD10-I70.0).  Right hepatic lobe hemangioma.  Heterogeneous density throughout the pelvis. Although this could relate to aggressive osteoporosis, recommend clinical correlation for multiple myeloma.  Cystoscopy with Dr. Bernardo Heater 12/2017 Moderate lateral lobe enlargement with hypervascularity prostatic urethra.  Mild elevation bladder neck.  Urine cytology negative for malignant cells.  (seen by cancer center for multiple myeloma).  He has not had any gross hematuria.  His UA is negative micro scopic hematuria.    He is having intermittent discomfort with urinating, post-void dribbling, weak urinary stream and hesitancy for several years.   His UA is benign.  His PVR is 3 mL.  Patient denies any modifying or aggravating factors.  Patient denies any gross hematuria, dysuria or suprapubic/flank pain.  Patient denies any fevers, chills, nausea or vomiting.   PMH: Past Medical History:  Diagnosis Date  . Borderline diabetic   . Cancer ([place])   . GERD (gastroesophageal reflux disease)     Surgical History: Past Surgical History:  Procedure Laterality Date  . CERVICAL FUSION      Home Medications:  Allergies as of 03/13/2020      Reactions   Penicillins Rash      Medication List       Accurate as of March 13, 2020 11:59 PM. If you have any questions, ask your nurse or doctor.        STOP taking these medications   diclofenac Sodium 1 % Gel Commonly known as: Voltaren Stopped by: Blakelyn Dinges, PA-C    diphenhydrAMINE 25 mg capsule Commonly known as: BENADRYL Stopped by: Zara Council, PA-C     TAKE these medications   doxazosin 8 MG tablet Commonly known as: CARDURA Take 8 mg by mouth daily.   finasteride 5 MG tablet Commonly known as: PROSCAR Take 5 mg by mouth daily.   furosemide 20 MG tablet Commonly known as: LASIX Take 1 tablet (20 mg total) by mouth daily for 3 days.   HYDROcodone-acetaminophen 5-325 MG tablet Commonly known as: NORCO/VICODIN Take 1 tablet by mouth every 4 (four) hours as needed for moderate pain.   ibuprofen 600 MG tablet Commonly known as: ADVIL Take 1 tablet (600 mg total) by mouth every 6 (six) hours as needed for moderate pain.   Lotrimin AF Powder 2 % Aerp Generic drug: Miconazole Nitrate Apply 1 application topically 2 (two) times daily.   omeprazole 20 MG capsule Commonly known as: PRILOSEC Take 20 mg by mouth daily.   pravastatin 40 MG tablet Commonly known as: PRAVACHOL SMARTSIG:1 Tablet(s) By Mouth Every Evening What changed: Another medication with the same name was removed. Continue taking this medication, and follow the directions you see here. Changed by: Zara Council, PA-C   traMADol 50 MG tablet Commonly known as: ULTRAM Take 1 tablet (50 mg total) by mouth every 6 (six) hours as needed.       Allergies:  Allergies  Allergen Reactions  . Penicillins Rash    Family History: No family history on file.  Social History:  reports  that he has quit smoking. He has never used smokeless tobacco. He reports that he does not drink alcohol and does not use drugs.  ROS: For pertinent review of systems please refer to history of present illness  Physical Exam: BP 114/70   Pulse 76   Ht 5' 10"  (1.778 m)   Wt 175 lb (79.4 kg)   BMI 25.11 kg/m   Constitutional:  Well nourished. Alert and oriented, No acute distress. HEENT: [place] AT, mask in plce.  Trachea midline Cardiovascular: No clubbing, cyanosis, or  edema. Respiratory: Normal respiratory effort, no increased work of breathing. GU: No CVA tenderness.  No bladder fullness or masses.  Patient with circumcised phallus.  Urethral meatus is patent.  No penile discharge. No penile lesions or rashes. Scrotum without lesions, cysts, rashes and/or edema.  Testicles are located scrotally bilaterally. No masses are appreciated in the testicles. Left and right epididymis are normal. Rectal: Patient with  normal sphincter tone. Anus and perineum without scarring or rashes. No rectal masses are appreciated. Prostate is approximately 60 + grams, could only palpate the apex, no nodules are appreciated. Seminal vesicles could not be palpated Neurologic: Grossly intact, no focal deficits, moving all 4 extremities. Psychiatric: Normal mood and affect.  Laboratory Data: Lab Results  Component Value Date   WBC 6.1 02/27/2020   HGB 12.2 (L) 02/27/2020   HCT 35.9 (L) 02/27/2020   MCV 93.5 02/27/2020   PLT 255 02/27/2020    Lab Results  Component Value Date   CREATININE 0.74 02/27/2020    Lab Results  Component Value Date   AST 22 02/27/2020   Lab Results  Component Value Date   ALT 16 02/27/2020    Urinalysis Component     Latest Ref Rng & Units 03/13/2020  Specific Gravity, UA     1.005 - 1.030 >1.030 (H)  pH, UA     5.0 - 7.5 5.5  Color, UA     Yellow Yellow  Appearance Ur     Clear Clear  Leukocytes,UA     Negative Negative  Protein,UA     Negative/Trace Negative  Glucose, UA     Negative Negative  Ketones, UA     Negative Negative  RBC, UA     Negative Trace (A)  Bilirubin, UA     Negative Negative  Urobilinogen, Ur     0.2 - 1.0 mg/dL 0.2  Nitrite, UA     Negative Negative   Component     Latest Ref Rng & Units 03/13/2020  WBC, UA     0 - 5 /hpf None seen  RBC     0 - 2 /hpf None seen  Epithelial Cells (non renal)     0 - 10 /hpf None seen  Bacteria, UA     None seen/Few None seen  I have reviewed the  labs.  Pertinent Imaging Results for PARKE, [name] (MRN [id number]) as of 03/13/2020 10:29  Ref. Range 03/13/2020 10:27  Scan Result Unknown 3 ml    Assessment & Plan:   1. High risk hematuria Hematuria work up completed in 12/2017 - findings positive for hypervascular prostate No report of gross hematuria  UA today negative  RTC in one year for UA - patient to report any gross hematuria in the interim    2. BPH with LU TS Continue doxazosin 8 mg and finasteride 5 mg daily RTC in one year for exam   Return in about 1 year (around 03/13/2021) for  UA, I PSS and PVR.  These notes generated with voice recognition software. I apologize for typographical errors.  Zara Council, PA-C  [facility] [address] [place]  [place], [place] [address] [phone]

## 2020-03-13 ENCOUNTER — Encounter: Payer: Self-pay | Admitting: Urology

## 2020-03-13 ENCOUNTER — Other Ambulatory Visit: Payer: Self-pay

## 2020-03-13 ENCOUNTER — Ambulatory Visit (INDEPENDENT_AMBULATORY_CARE_PROVIDER_SITE_OTHER): Payer: Medicare HMO | Admitting: Urology

## 2020-03-13 VITALS — BP 114/70 | HR 76 | Ht 70.0 in | Wt 175.0 lb

## 2020-03-13 DIAGNOSIS — N401 Enlarged prostate with lower urinary tract symptoms: Secondary | ICD-10-CM | POA: Diagnosis not present

## 2020-03-13 DIAGNOSIS — N138 Other obstructive and reflux uropathy: Secondary | ICD-10-CM | POA: Diagnosis not present

## 2020-03-13 DIAGNOSIS — R319 Hematuria, unspecified: Secondary | ICD-10-CM | POA: Diagnosis not present

## 2020-03-13 LAB — BLADDER SCAN AMB NON-IMAGING: Scan Result: 3

## 2020-03-16 LAB — URINALYSIS, COMPLETE
Bilirubin, UA: NEGATIVE
Glucose, UA: NEGATIVE
Ketones, UA: NEGATIVE
Leukocytes,UA: NEGATIVE
Nitrite, UA: NEGATIVE
Protein,UA: NEGATIVE
Specific Gravity, UA: >1.03 — ABNORMAL HIGH (ref 1.005–1.030)
Urobilinogen, Ur: 0.2 mg/dL (ref 0.2–1.0)
pH, UA: 5.5 (ref 5.0–7.5)

## 2020-03-16 LAB — MICROSCOPIC EXAMINATION
Bacteria, UA: NONE SEEN
Epithelial Cells (non renal): NONE SEEN /hpf (ref 0–10)
RBC, Urine: NONE SEEN /hpf (ref 0–2)
WBC, UA: NONE SEEN /hpf (ref 0–5)

## 2020-04-01 IMAGING — CT CT ABD-PEL WO/W CM
3 of 7 series · 13 of 32 positions shown, 18 images · IV contrast (APPLIED)
Comparison: None.

CLINICAL DATA: Gross hematuria 2 months ago twice. Dysuria and back
pain.

EXAM:
CT ABDOMEN AND PELVIS WITHOUT AND WITH CONTRAST
TECHNIQUE: Multidetector CT imaging of the abdomen and pelvis was performed
following the standard protocol before and following the bolus
administration of intravenous contrast.
CONTRAST:  125mL D1O1Y3-JBB IOPAMIDOL (D1O1Y3-JBB) INJECTION 61%

[Series 9: axial pre · axial · non-contrast · 0.65mm/px · z∈[-625,-260]mm · 6 of 103 slices shown, 11 images]
[im 15/103  soft-tissue]
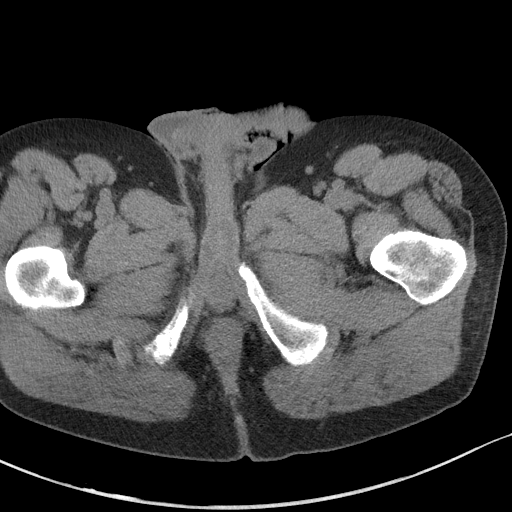
[im 15/103  bone]
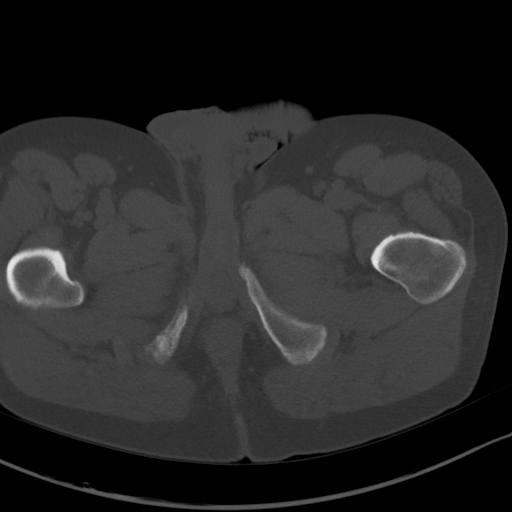
[im 30/103  soft-tissue]
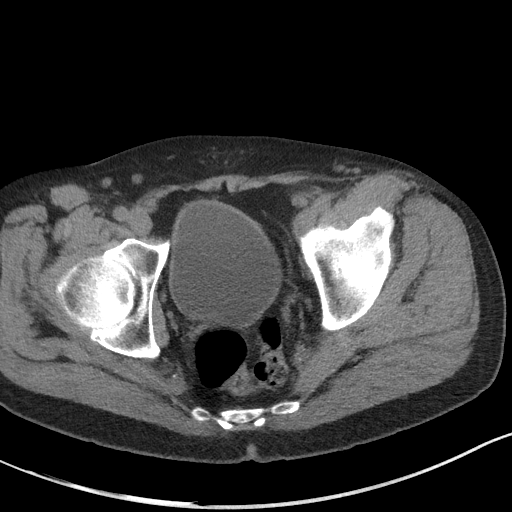
[im 44/103  soft-tissue]
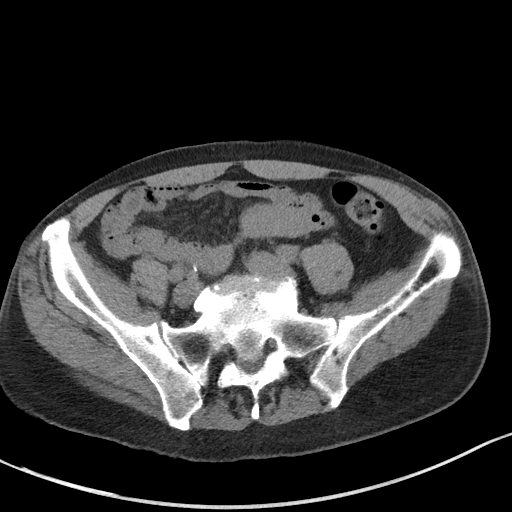
[im 44/103  lung]
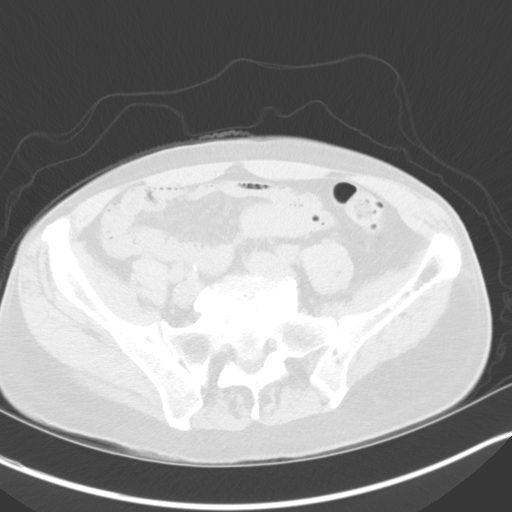
[im 59/103  soft-tissue]
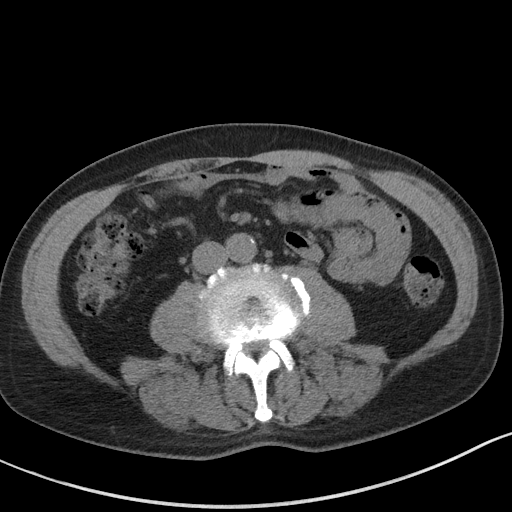
[im 59/103  lung]
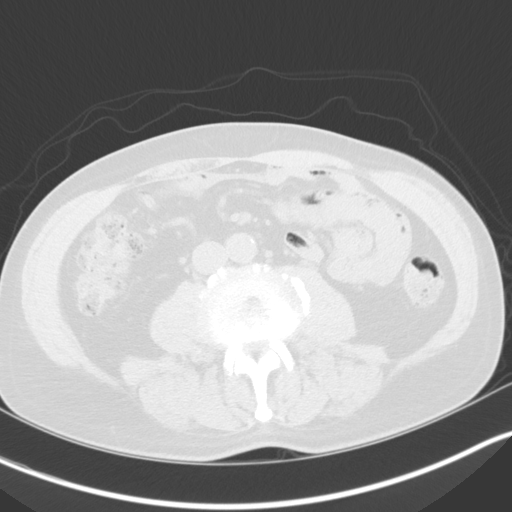
[im 73/103  soft-tissue]
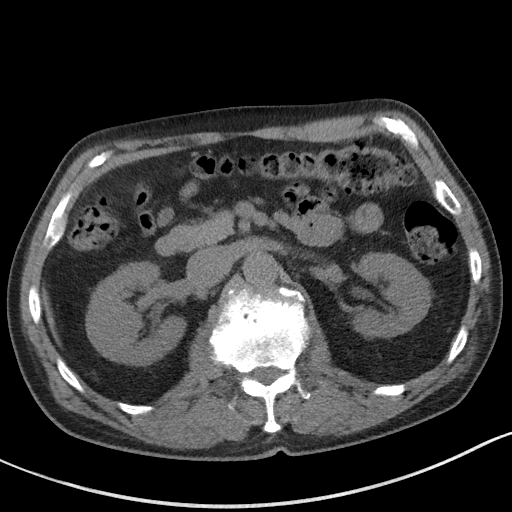
[im 73/103  lung]
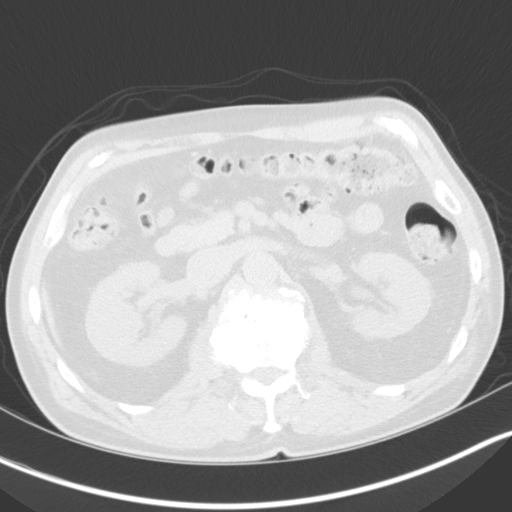
[im 88/103  soft-tissue]
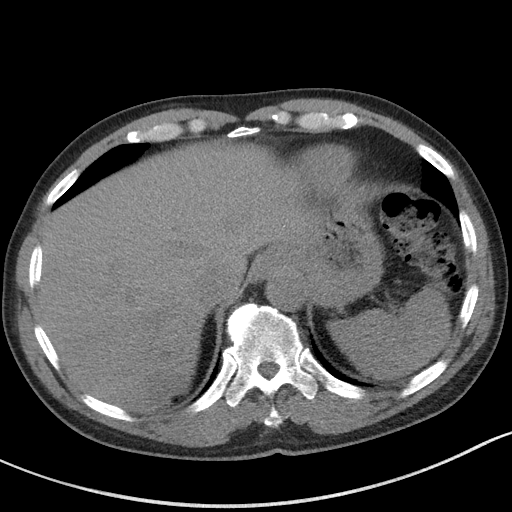
[im 88/103  lung]
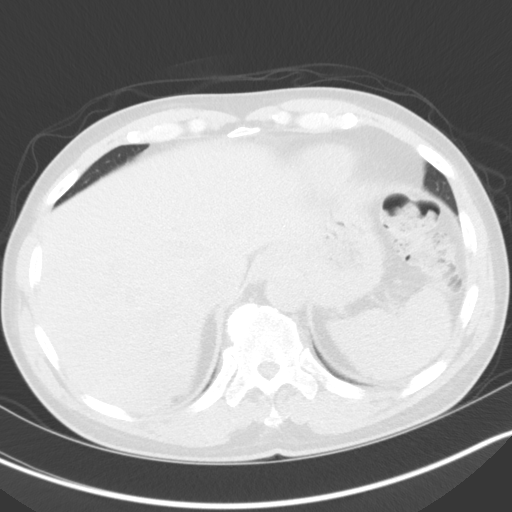

[Series 10: axial post · axial · 0.64mm/px · z∈[-585,-265]mm · 5 of 96 slices shown]
[im 16/96  soft-tissue]
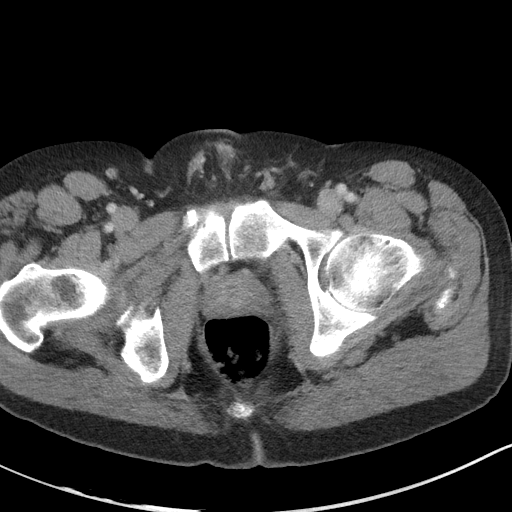
[im 32/96  soft-tissue]
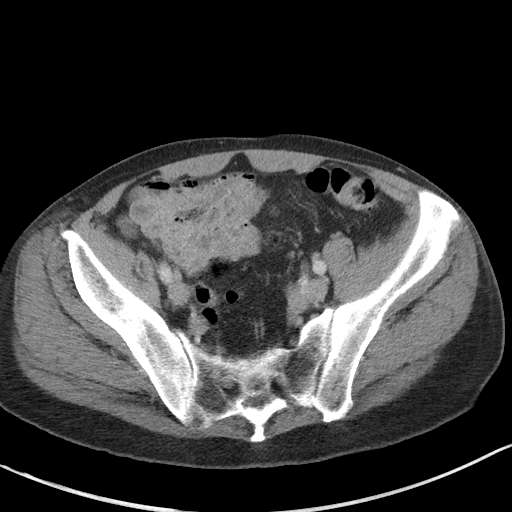
[im 48/96  soft-tissue]
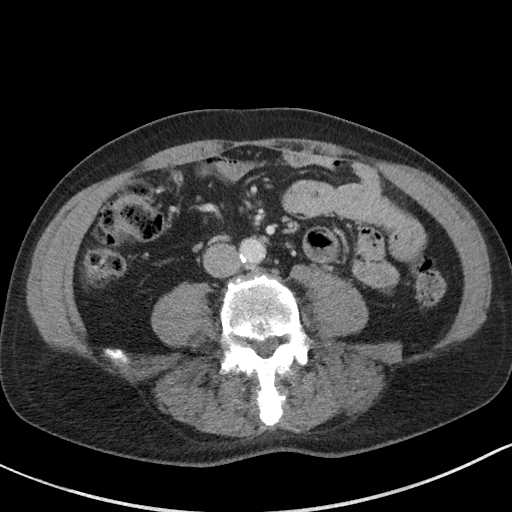
[im 64/96  soft-tissue]
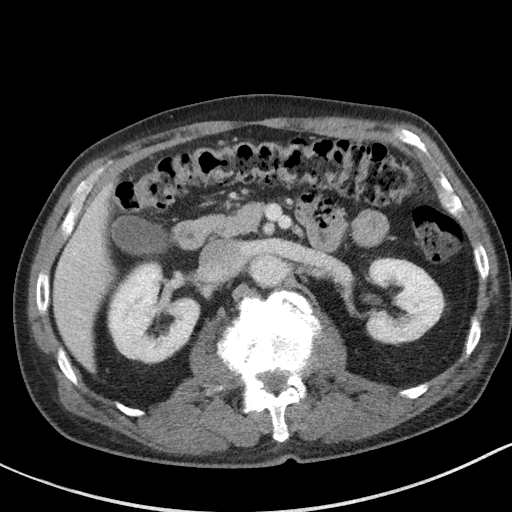
[im 80/96  soft-tissue]
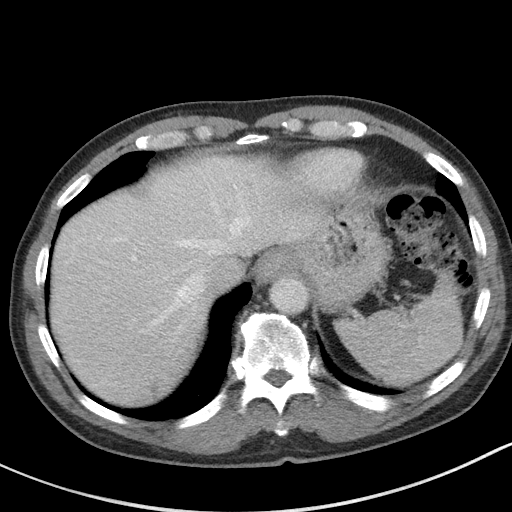

[Series 23: axial delay.. · axial · delayed · 0.64mm/px · z∈[-576,-502]mm · 2 of 92 slices shown]
[im 16/92  soft-tissue]
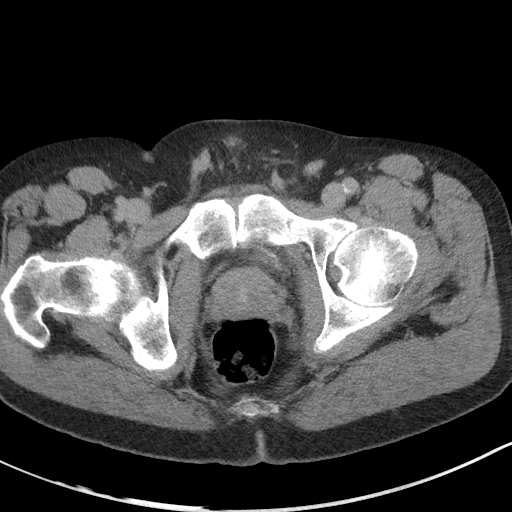
[im 31/92  soft-tissue]
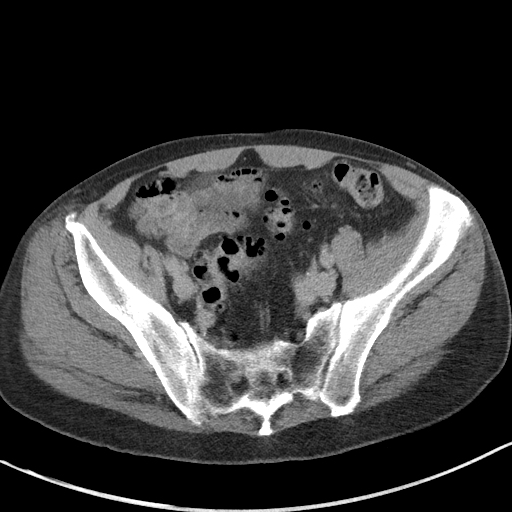

[13 of 32 positions shown; findings below may reference images not displayed]

FINDINGS: Lower chest: Clear lung bases. Normal heart size without pericardial
or pleural effusion.

Hepatobiliary: Posterior right hepatic lobe 2.5 cm hemangioma.
Segment 4 a too small to characterize liver lesion. Normal
gallbladder, without biliary ductal dilatation.

Pancreas: Normal, without mass or ductal dilatation.

Spleen: Normal in size, without focal abnormality.

Adrenals/Urinary Tract: Normal adrenal glands. a suspicion of a
punctate left renal collecting system calculus, including on
sagittal image 93 and coronal image 53. No hydroureter or ureteric
calculi. No bladder calculi.

No suspicious renal mass on post-contrast imaging. Moderate renal
collecting system opacification on delayed images. Suboptimal
ureteric opacification on delayed images. No filling defects in the
collecting systems or opacified ureters. There is no enhancing
bladder mass. No posterior bladder filling defect on delayed images.
Non-opacified urine is identified within the anterior/nondependent
bladder.

Stomach/Bowel: Normal stomach, without wall thickening. Extensive
colonic diverticulosis. Normal appendix and terminal ileum. Normal
small bowel.

Vascular/Lymphatic: Aortic and branch vessel atherosclerosis. No
abdominopelvic adenopathy.

Reproductive: Normal prostate.  Small bilateral hydroceles.

Other: No significant free fluid.

Musculoskeletal: Heterogeneous osseous density within the pelvis.
Example with cortical irregularity and lucency in the left iliac on
image 61/9.
IMPRESSION: 1. Possible left nephrolithiasis. No other explanation for
hematuria.
2.  Aortic Atherosclerosis ([id number].B).
3. Right hepatic lobe hemangioma.
4. Heterogeneous density throughout the pelvis. Although this could
relate to aggressive osteoporosis, recommend clinical correlation
for multiple myeloma.

## 2020-06-11 ENCOUNTER — Other Ambulatory Visit: Payer: Self-pay

## 2020-06-11 ENCOUNTER — Inpatient Hospital Stay: Payer: Medicare Other | Attending: Oncology

## 2020-06-11 DIAGNOSIS — Z87891 Personal history of nicotine dependence: Secondary | ICD-10-CM | POA: Insufficient documentation

## 2020-06-11 DIAGNOSIS — K219 Gastro-esophageal reflux disease without esophagitis: Secondary | ICD-10-CM | POA: Diagnosis not present

## 2020-06-11 DIAGNOSIS — R5383 Other fatigue: Secondary | ICD-10-CM | POA: Diagnosis not present

## 2020-06-11 DIAGNOSIS — D649 Anemia, unspecified: Secondary | ICD-10-CM | POA: Insufficient documentation

## 2020-06-11 DIAGNOSIS — D472 Monoclonal gammopathy: Secondary | ICD-10-CM | POA: Insufficient documentation

## 2020-06-11 DIAGNOSIS — R918 Other nonspecific abnormal finding of lung field: Secondary | ICD-10-CM | POA: Insufficient documentation

## 2020-06-11 LAB — CBC WITH DIFFERENTIAL/PLATELET
Abs Immature Granulocytes: 0.01 10*3/uL (ref 0.00–0.07)
Basophils Absolute: 0 10*3/uL (ref 0.0–0.1)
Basophils Relative: 1 %
Eosinophils Absolute: 0.2 10*3/uL (ref 0.0–0.5)
Eosinophils Relative: 4 %
HCT: 35.5 % — ABNORMAL LOW (ref 39.0–52.0)
Hemoglobin: 11.6 g/dL — ABNORMAL LOW (ref 13.0–17.0)
Immature Granulocytes: 0 %
Lymphocytes Relative: 27 %
Lymphs Abs: 1.3 10*3/uL (ref 0.7–4.0)
MCH: 31 pg (ref 26.0–34.0)
MCHC: 32.7 g/dL (ref 30.0–36.0)
MCV: 94.9 fL (ref 80.0–100.0)
Monocytes Absolute: 0.3 10*3/uL (ref 0.1–1.0)
Monocytes Relative: 7 %
Neutro Abs: 2.9 10*3/uL (ref 1.7–7.7)
Neutrophils Relative %: 61 %
Platelets: 200 10*3/uL (ref 150–400)
RBC: 3.74 MIL/uL — ABNORMAL LOW (ref 4.22–5.81)
RDW: 12.4 % (ref 11.5–15.5)
WBC: 4.8 10*3/uL (ref 4.0–10.5)
nRBC: 0 % (ref 0.0–0.2)

## 2020-06-11 LAB — BASIC METABOLIC PANEL
Anion gap: 7 (ref 5–15)
BUN: 12 mg/dL (ref 8–23)
CO2: 25 mmol/L (ref 22–32)
Calcium: 8.9 mg/dL (ref 8.9–10.3)
Chloride: 108 mmol/L (ref 98–111)
Creatinine, Ser: 0.75 mg/dL (ref 0.61–1.24)
GFR, Estimated: >60 mL/min (ref >60)
Glucose, Bld: 116 mg/dL — ABNORMAL HIGH (ref 70–99)
Potassium: 3.6 mmol/L (ref 3.5–5.1)
Sodium: 140 mmol/L (ref 135–145)

## 2020-06-12 LAB — KAPPA/LAMBDA LIGHT CHAINS
Kappa free light chain: 21.8 mg/L — ABNORMAL HIGH (ref 3.3–19.4)
Kappa, lambda light chain ratio: 1.95 — ABNORMAL HIGH (ref 0.26–1.65)
Lambda free light chains: 11.2 mg/L (ref 5.7–26.3)

## 2020-06-12 LAB — IGG, IGA, IGM
IgA: 99 mg/dL (ref 61–437)
IgG (Immunoglobin G), Serum: 1508 mg/dL (ref 603–1613)
IgM (Immunoglobulin M), Srm: 262 mg/dL — ABNORMAL HIGH (ref 15–143)

## 2020-06-15 ENCOUNTER — Ambulatory Visit
Admission: RE | Admit: 2020-06-15 | Discharge: 2020-06-15 | Disposition: A | Discharge status: Home / Self Care | Payer: Medicare Other | Source: Ambulatory Visit | Attending: Oncology | Admitting: Oncology

## 2020-06-15 ENCOUNTER — Other Ambulatory Visit: Payer: Self-pay

## 2020-06-15 DIAGNOSIS — R911 Solitary pulmonary nodule: Secondary | ICD-10-CM | POA: Diagnosis not present

## 2020-06-15 LAB — PROTEIN ELECTROPHORESIS, SERUM
A/G Ratio: 1.1 (ref 0.7–1.7)
Albumin ELP: 3.7 g/dL (ref 2.9–4.4)
Alpha-1-Globulin: 0.2 g/dL (ref 0.0–0.4)
Alpha-2-Globulin: 0.7 g/dL (ref 0.4–1.0)
Beta Globulin: 0.9 g/dL (ref 0.7–1.3)
Gamma Globulin: 1.6 g/dL (ref 0.4–1.8)
Globulin, Total: 3.4 g/dL (ref 2.2–3.9)
M-Spike, %: 0.6 g/dL — ABNORMAL HIGH
Total Protein ELP: 7.1 g/dL (ref 6.0–8.5)

## 2020-06-15 MED ORDER — IOHEXOL 300 MG/ML  SOLN
75.0000 mL | Freq: Once | INTRAMUSCULAR | Status: AC | PRN
  Administered 2020-06-15: 75 mL via INTRAVENOUS

## 2020-06-19 ENCOUNTER — Other Ambulatory Visit: Payer: Self-pay

## 2020-06-19 ENCOUNTER — Encounter: Payer: Self-pay | Admitting: Oncology

## 2020-06-19 ENCOUNTER — Inpatient Hospital Stay: Payer: Medicare Other

## 2020-06-19 ENCOUNTER — Inpatient Hospital Stay (HOSPITAL_BASED_OUTPATIENT_CLINIC_OR_DEPARTMENT_OTHER): Payer: Medicare Other | Admitting: Oncology

## 2020-06-19 VITALS — BP 104/60 | HR 67 | Temp 98.7°F | Resp 20 | Wt 185.0 lb

## 2020-06-19 DIAGNOSIS — D649 Anemia, unspecified: Secondary | ICD-10-CM

## 2020-06-19 DIAGNOSIS — R911 Solitary pulmonary nodule: Secondary | ICD-10-CM | POA: Diagnosis not present

## 2020-06-19 DIAGNOSIS — R5383 Other fatigue: Secondary | ICD-10-CM

## 2020-06-19 DIAGNOSIS — D472 Monoclonal gammopathy: Secondary | ICD-10-CM | POA: Diagnosis not present

## 2020-06-19 LAB — IRON AND TIBC
Iron: 54 ug/dL (ref 45–182)
Saturation Ratios: 16 % — ABNORMAL LOW (ref 17.9–39.5)
TIBC: 347 ug/dL (ref 250–450)
UIBC: 293 ug/dL

## 2020-06-19 LAB — FERRITIN: Ferritin: 57 ng/mL (ref 24–336)

## 2020-06-19 LAB — VITAMIN B12: Vitamin B-12: 185 pg/mL (ref 180–914)

## 2020-06-19 LAB — FOLATE: Folate: 16.5 ng/mL (ref >5.9)

## 2020-06-19 LAB — VITAMIN D 25 HYDROXY (VIT D DEFICIENCY, FRACTURES): Vit D, 25-Hydroxy: 25 ng/mL — ABNORMAL LOW (ref 30–100)

## 2020-06-19 NOTE — Progress Notes (Signed)
Patient states he has knot on left shoulder blade. Patient states it has been hurting him lately.

## 2020-06-19 NOTE — Progress Notes (Signed)
[place]  [phone] Fax:(336) [phone]  ID: [id number] LAKINS OB: [birth year]  MR#: [id number]  YSA#:[id number]  Patient Care Team: Marguerita Merles, MD as PCP - General (Family Medicine)  CHIEF COMPLAINT: MGUS, pulmonary nodules.  INTERVAL HISTORY: Patient returns to clinic today for routine 31-monthevaluation and discussion of his imaging and laboratory results.  He was last seen in clinic on 12/20/2019.  In the interim, he reports feeling fatigued.  States his furnace and wConsulting civil engineerwent out in October and he has been unable to fix them.  He has been heating his home with space heaters and kerosene.  Endorses a "knot" on his left shoulder that is tender intermittently.  It is soft and movable.  Denies injury.  And reports wanting to find a job but states his fatigue is so variable he does not want to commit himself at this time.  He denies any recent fevers or illness.  His appetite has been stable.  Denies any chest pain, shortness of breath, cough or hemoptysis.   REVIEW OF SYSTEMS:   Review of Systems  Constitutional: Positive for malaise/fatigue. Negative for fever and weight loss.  Respiratory: Negative.  Negative for cough, hemoptysis and shortness of breath.   Cardiovascular: Negative.  Negative for chest pain and leg swelling.  Gastrointestinal: Negative.  Negative for abdominal pain.  Genitourinary: Negative.  Negative for hematuria.  Musculoskeletal: Negative.  Negative for back pain and joint pain.       Right shoulder lump   Skin: Negative.  Negative for rash.  Neurological: Negative.  Negative for focal weakness, weakness and headaches.  Psychiatric/Behavioral: Negative.  The patient is not nervous/anxious.     As per HPI. Otherwise, a complete review of systems is negative.  PAST MEDICAL HISTORY: Past Medical History:  Diagnosis Date  . Borderline diabetic   . [place]   . GERD (gastroesophageal reflux disease)     PAST  SURGICAL HISTORY: Past Surgical History:  Procedure Laterality Date  . CERVICAL FUSION      FAMILY HISTORY: History reviewed. No pertinent family history.  ADVANCED DIRECTIVES (Y/N):  N  HEALTH MAINTENANCE: Social History   Tobacco Use  . Smoking status: Former SResearch scientist (life sciences) . Smokeless tobacco: Never Used  Vaping Use  . Vaping Use: Never used  Substance Use Topics  . Alcohol use: No  . Drug use: Never     Colonoscopy:  PAP:  Bone density:  Lipid panel:  Allergies  Allergen Reactions  . Penicillins Rash    Current Outpatient Medications  Medication Sig Dispense Refill  . doxazosin (CARDURA) 8 MG tablet Take 8 mg by mouth daily.    . finasteride (PROSCAR) 5 MG tablet Take 5 mg by mouth daily.    . furosemide (LASIX) 20 MG tablet Take 1 tablet (20 mg total) by mouth daily for 3 days. 3 tablet 0  . HYDROcodone-acetaminophen (NORCO/VICODIN) 5-325 MG tablet Take 1 tablet by mouth every 4 (four) hours as needed for moderate pain. 16 tablet 0  . ibuprofen (ADVIL,MOTRIN) 600 MG tablet Take 1 tablet (600 mg total) by mouth every 6 (six) hours as needed for moderate pain. 30 tablet 0  . Miconazole Nitrate (LOTRIMIN AF POWDER) 2 % AERP Apply 1 application topically 2 (two) times daily. 85 g 0  . omeprazole (PRILOSEC) 20 MG capsule Take 20 mg by mouth daily.    . pravastatin (PRAVACHOL) 40 MG tablet SMARTSIG:1 Tablet(s) By Mouth Every Evening    .  traMADol (ULTRAM) 50 MG tablet Take 1 tablet (50 mg total) by mouth every 6 (six) hours as needed. 15 tablet 0   No current facility-administered medications for this visit.    OBJECTIVE: Vitals:   06/19/20 1055  BP: 104/60  Pulse: 67  Resp: 20  Temp: 98.7 F (37.1 C)  SpO2: 100%     Body mass index is 26.54 kg/m.    ECOG FS:0 - Asymptomatic  Physical Exam Constitutional:      Appearance: Normal appearance.  HENT:     Head: Normocephalic and atraumatic.  Eyes:     Pupils: Pupils are equal, round, and reactive to light.   Cardiovascular:     Rate and Rhythm: Normal rate and regular rhythm.     Heart sounds: Normal heart sounds. No murmur heard.   Pulmonary:     Effort: Pulmonary effort is normal.     Breath sounds: Normal breath sounds. No wheezing.  Abdominal:     General: Bowel sounds are normal. There is no distension.     Palpations: Abdomen is soft.     Tenderness: There is no abdominal tenderness.  Musculoskeletal:        General: Normal range of motion.     Cervical back: Normal range of motion.     Comments: Small moveable lipoma on right shoulder  Skin:    General: Skin is warm and dry.     Findings: No rash.  Neurological:     Mental Status: He is alert and oriented to person, place, and time.  Psychiatric:        Judgment: Judgment normal.      LAB RESULTS:  Lab Results  Component Value Date   NA 140 06/11/2020   K 3.6 06/11/2020   CL 108 06/11/2020   CO2 25 06/11/2020   GLUCOSE 116 (H) 06/11/2020   BUN 12 06/11/2020   CREATININE 0.75 06/11/2020   CALCIUM 8.9 06/11/2020   PROT 7.3 02/27/2020   ALBUMIN 3.7 02/27/2020   AST 22 02/27/2020   ALT 16 02/27/2020   ALKPHOS 70 02/27/2020   BILITOT 0.6 02/27/2020   GFRNONAA >60 06/11/2020   GFRAA >60 12/18/2019    Lab Results  Component Value Date   WBC 4.8 06/11/2020   NEUTROABS 2.9 06/11/2020   HGB 11.6 (L) 06/11/2020   HCT 35.5 (L) 06/11/2020   MCV 94.9 06/11/2020   PLT 200 06/11/2020   Lab Results  Component Value Date   TOTALPROTELP 7.1 06/11/2020   ALBUMINELP 3.7 06/11/2020   A1GS 0.2 06/11/2020   A2GS 0.7 06/11/2020   BETS 0.9 06/11/2020   GAMS 1.6 06/11/2020   MSPIKE 0.6 (H) 06/11/2020   SPEI Comment 06/11/2020     STUDIES: CT Chest W Contrast  Result Date: 06/15/2020 CLINICAL DATA:  Follow-up indeterminate right lung nodules. EXAM: CT CHEST WITH CONTRAST TECHNIQUE: Multidetector CT imaging of the chest was performed during intravenous contrast administration. CONTRAST:  51m OMNIPAQUE IOHEXOL 300  MG/ML  SOLN COMPARISON:  02/27/2020 and earlier studies dating back to 02/28/2019 FINDINGS: Cardiovascular:  No acute findings. Mediastinum/Nodes: No pathologically enlarged lymph nodes or other masses identified. Lungs/Pleura: Poorly defined pulmonary nodule in the superior segment of the right lower measures 1.2 x 1.0 cm on image 61/3, slightly increased in size from 1.1 x 0.9 cm on 02/28/2019. Lobulated pulmonary nodule in the anterior inferior aspect of the right upper lobe measures 1.8 x 1.2 cm on image 81/3, without significant change. No new or enlarging pulmonary nodules  or masses identified. No evidence of pulmonary infiltrate or pleural effusion. Upper Abdomen: Stable benign hemangioma in the posterior right hepatic lobe. Musculoskeletal:  No suspicious bone lesions. IMPRESSION: 1. Minimal increase in size of 1.2 cm pulmonary nodule in superior segment of right lower lobe compared to previous studies, and stable 1.8 cm pulmonary nodule in anterior inferior right upper lobe. Low-grade adenocarcinomas cannot be excluded. Consider further evaluation with PET-CT, or continued follow-up by chest CT in 1 year. 2. No evidence of lymphadenopathy or pleural effusion. 3. Stable benign hemangioma in right hepatic lobe. Electronically Signed   By: Marlaine Hind M.D.   On: 06/15/2020 11:14    ASSESSMENT: MGUS, pulmonary nodules.  PLAN:    1. MGUS:  -Had bone marrow biopsy on 03/13/2018 which revealed hypercellular bone marrow with trilineage hematopoiesis. -Had 5% plasma cells.  Psychogenic studies were normal. -Metastatic bone survey from 02/02/2018 revealed widespread osseous lesions of unclear etiology. -Baseline M spike has been between 0.5-0.7. -Continues to have mild IgM predominance and elevated kappa lambda light chain ratio which is stable. -No evidence of endorgan damage. -Labs from 06/11/2020 show hemoglobin of 11.6, calcium 8.9, M spike of 0.6, IgM 262 and kappa lambda light chain ratio  1.95.  2.  Pulmonary nodules: -Initially discovered on 05/02/2018 during work-up. -We have been following every 6 months to a year with imaging. -Had PET scan on 03/14/2019 which did not reveal any significant hypermetabolism. -Most recent CT chest from 06/15/2020 shows minimal increase in size of 1.2 cm pulmonary nodule in superior segment of right lower lobe and stable 1.8 cm pulmonary nodule in right upper lobe.  Low-grade adenocarcinoma cannot be excluded.  Consider further evaluation with PET scan or continue follow-up by chest CT in 1 year.  There is no evidence of lymphadenopathy or pleural effusion.  3.  Osseous lesions:  -Per metastatic bone survey in November 2019. -Unclear etiology.   -Bone marrow biopsy not indicative of myeloma and PSA within normal limits. -PET scan negative.  4.  Significant fatigue: -Labs from 06/11/2020 show a hemoglobin of 11.6. -Baseline hemoglobin has been low since 2019 ranging from 11.3-12.4. -No work-up has been done. -We will add on ferritin, iron, vitamin B12, folate, vitamin D and copper level.  We will call with results.  5. Right shoulder lipoma: -On examination, area is movable and soft. -Recommend follow-up with PCP.  Disposition: -Labs today. -Repeat CT chest in 6 months. -RTC in 6 months with repeat lab work after CT scan and MD assessment.  Greater than 50% was spent in counseling and coordination of care with this patient including but not limited to discussion of the relevant topics above (See A&P) including, but not limited to diagnosis and management of acute and chronic medical conditions.   Patient expressed understanding and was in agreement with this plan. He also understands that He can call clinic at any time with any questions, concerns, or complaints.    Jacquelin Hawking, NP   06/19/2020 11:19 AM

## 2020-06-22 LAB — METHYLMALONIC ACID, SERUM: Methylmalonic Acid, Quantitative: 246 nmol/L (ref 0–378)

## 2020-06-24 LAB — COPPER, SERUM: Copper: 90 ug/dL (ref 69–132)

## 2020-06-25 ENCOUNTER — Telehealth: Payer: Self-pay | Admitting: Oncology

## 2020-06-25 ENCOUNTER — Other Ambulatory Visit: Payer: Self-pay | Admitting: Oncology

## 2020-06-25 DIAGNOSIS — D509 Iron deficiency anemia, unspecified: Secondary | ICD-10-CM | POA: Insufficient documentation

## 2020-06-25 MED ORDER — ERGOCALCIFEROL 1.25 MG (50000 UT) PO CAPS: 50000.0000 [IU] | ORAL_CAPSULE | ORAL | 1 refills | Status: AC

## 2020-06-25 NOTE — Telephone Encounter (Signed)
Re: Lab results  Reviewed labs with patient.   Iron saturation levels are low at 16% with normal Ferritin of 57.  Folate, copper and MMA are all WNL.  Vitamin D level is low at 25.  B12 level is 185 which is at the low end of normal (180-914).   Spoke with Dr. Grayland Ormond, who recommends 2 doses of IV and B12 injections.  Plan is to get him set up for IV iron X 2 and B12 injection monthly x4.  He will also need to start on vitamin D supplementation.  Prescription sent for ergocalciferol 50,000 units weekly to pharmacy.  He will return to clinic as scheduled.  Faythe Casa, NP 06/25/2020 10:25 AM

## 2020-09-22 ENCOUNTER — Emergency Department: Payer: Medicare Other

## 2020-09-22 ENCOUNTER — Emergency Department
Admission: EM | Admit: 2020-09-22 | Discharge: 2020-09-22 | Disposition: A | Discharge status: Home / Self Care | Payer: Medicare Other | Attending: Emergency Medicine | Admitting: Emergency Medicine

## 2020-09-22 ENCOUNTER — Encounter: Payer: Self-pay | Admitting: Emergency Medicine

## 2020-09-22 ENCOUNTER — Other Ambulatory Visit: Payer: Self-pay

## 2020-09-22 DIAGNOSIS — Z87891 Personal history of nicotine dependence: Secondary | ICD-10-CM | POA: Insufficient documentation

## 2020-09-22 DIAGNOSIS — R0789 Other chest pain: Secondary | ICD-10-CM | POA: Diagnosis present

## 2020-09-22 DIAGNOSIS — Z85828 Personal history of other malignant neoplasm of skin: Secondary | ICD-10-CM | POA: Insufficient documentation

## 2020-09-22 DIAGNOSIS — K219 Gastro-esophageal reflux disease without esophagitis: Secondary | ICD-10-CM | POA: Diagnosis not present

## 2020-09-22 LAB — CBC
HCT: 35.8 % — ABNORMAL LOW (ref 39.0–52.0)
Hemoglobin: 12.2 g/dL — ABNORMAL LOW (ref 13.0–17.0)
MCH: 30.9 pg (ref 26.0–34.0)
MCHC: 34.1 g/dL (ref 30.0–36.0)
MCV: 90.6 fL (ref 80.0–100.0)
Platelets: 229 10*3/uL (ref 150–400)
RBC: 3.95 MIL/uL — ABNORMAL LOW (ref 4.22–5.81)
RDW: 12.7 % (ref 11.5–15.5)
WBC: 5 10*3/uL (ref 4.0–10.5)
nRBC: 0 % (ref 0.0–0.2)

## 2020-09-22 LAB — BASIC METABOLIC PANEL
Anion gap: 4 — ABNORMAL LOW (ref 5–15)
BUN: 14 mg/dL (ref 8–23)
CO2: 27 mmol/L (ref 22–32)
Calcium: 9.1 mg/dL (ref 8.9–10.3)
Chloride: 107 mmol/L (ref 98–111)
Creatinine, Ser: 0.77 mg/dL (ref 0.61–1.24)
GFR, Estimated: >60 mL/min (ref >60)
Glucose, Bld: 94 mg/dL (ref 70–99)
Potassium: 3.5 mmol/L (ref 3.5–5.1)
Sodium: 138 mmol/L (ref 135–145)

## 2020-09-22 LAB — D-DIMER, QUANTITATIVE: D-Dimer, Quant: 0.55 ug/mL-FEU — ABNORMAL HIGH (ref 0.00–0.50)

## 2020-09-22 LAB — TROPONIN I (HIGH SENSITIVITY)
Troponin I (High Sensitivity): 4 ng/L (ref <18)
Troponin I (High Sensitivity): 4 ng/L (ref <18)

## 2020-09-22 MED ORDER — ACETAMINOPHEN 500 MG PO TABS
1000.0000 mg | ORAL_TABLET | Freq: Once | ORAL | Status: AC
  Administered 2020-09-22: 1000 mg via ORAL
  Filled 2020-09-22: qty 2

## 2020-09-22 NOTE — Discharge Instructions (Addendum)
Use Tylenol for pain and fevers.  Up to 1000 mg per dose, up to 4 times per day.  Do not take more than 4000 mg of Tylenol/acetaminophen within 24 hours..  Return to the ED with any worsening symptoms despite these measures.

## 2020-09-22 NOTE — ED Notes (Signed)
ED Provider at bedside. 

## 2020-09-22 NOTE — ED Provider Notes (Signed)
[facility] Emergency Department Provider Note ____________________________________________   Event Date/Time   First MD Initiated Contact with Patient 09/22/20 1030     (approximate)  I have reviewed the triage vital signs and the nursing notes.  HISTORY  Chief Complaint Chest Pain   HPI Frank Buckley is a 75 y.o. malewho presents to the ED for evaluation of chest pain.  Chart review indicates history of MGUS, pulmonary nodules, followed by oncology and undergoing surveillance..   Patient presents to the ED for evaluation of acute on chronic right-sided chest pain.  He reports right-sided chest pain that has been intermittent over the past month or 2.  Reports an aching or sharp pain that is sharp in nature, lasting up to an hour prior to self resolving or resolved with Tylenol.  But for the past 24 hours or so, he has right-sided chest pain that has been more persistent despite Tylenol.  He reports a pleuritic nature to these.  Denies cough, productive cough, shortness of breath, syncopal episodes or hemoptysis, fevers, abdominal pain or postprandial symptoms.  Past Medical History:  Diagnosis Date   Borderline diabetic    Cancer ([place])    GERD (gastroesophageal reflux disease)     Patient Active Problem List   Diagnosis Date Noted   IDA (iron deficiency anemia) 06/25/2020   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 06/04/2018   MGUS (monoclonal gammopathy of unknown significance) 01/29/2018   Abnormal CT scan 01/21/2018    Past Surgical History:  Procedure Laterality Date   CERVICAL FUSION      Prior to Admission medications   Medication Sig Start Date End Date Taking? Authorizing Provider  doxazosin (CARDURA) 8 MG tablet Take 8 mg by mouth daily. 03/09/20   [provider]  ergocalciferol (VITAMIN D2) 1.25 MG (50000 UT) capsule Take 1 capsule (50,000 Units total) by mouth once a week. 06/25/20   Jacquelin Hawking, NP  finasteride  (PROSCAR) 5 MG tablet Take 5 mg by mouth daily. 03/09/20   [provider]  furosemide (LASIX) 20 MG tablet Take 1 tablet (20 mg total) by mouth daily for 3 days. 12/01/18 12/20/19  Paulette Blanch, MD  HYDROcodone-acetaminophen (NORCO/VICODIN) 5-325 MG tablet Take 1 tablet by mouth every 4 (four) hours as needed for moderate pain. 02/27/20   Cuthriell, Charline Bills, PA-C  ibuprofen (ADVIL,MOTRIN) 600 MG tablet Take 1 tablet (600 mg total) by mouth every 6 (six) hours as needed for moderate pain. 11/27/15   Duanne Guess, PA-C  Miconazole Nitrate (LOTRIMIN AF POWDER) 2 % AERP Apply 1 application topically 2 (two) times daily. 11/24/18   Merlyn Lot, MD  omeprazole (PRILOSEC) 20 MG capsule Take 20 mg by mouth daily.    [provider]  pravastatin (PRAVACHOL) 40 MG tablet SMARTSIG:1 Tablet(s) By Mouth Every Evening 01/23/20   [provider]  traMADol (ULTRAM) 50 MG tablet Take 1 tablet (50 mg total) by mouth every 6 (six) hours as needed. 07/04/18   Versie Starks, PA-C    Allergies Penicillins  No family history on file.  Social History Social History   Tobacco Use   Smoking status: Former    Pack years: 0.00   Smokeless tobacco: Never  Vaping Use   Vaping Use: Never used  Substance Use Topics   Alcohol use: No   Drug use: Never    Review of Systems  Constitutional: No fever/chills Eyes: No visual changes. ENT: No sore throat. Cardiovascular: Positive  for chest pain. Respiratory: Denies shortness of breath. Gastrointestinal: No abdominal pain.  No nausea, no vomiting.  No diarrhea.  No constipation. Genitourinary: Negative for dysuria. Musculoskeletal: Negative for back pain. Skin: Negative for rash. Neurological: Negative for headaches, focal weakness or numbness.  ____________________________________________   PHYSICAL EXAM:  VITAL SIGNS: Vitals:   09/22/20 1100 09/22/20 1130  BP: (!) 144/78 131/68  Pulse: (!) 57 (!) 49  Resp: 16 12   Temp:    SpO2: 100% 99%    Constitutional: Alert and oriented. Well appearing and in no acute distress. Eyes: Conjunctivae are normal. PERRL. EOMI. Head: Atraumatic. Nose: No congestion/rhinnorhea. Mouth/Throat: Mucous membranes are moist.  Oropharynx non-erythematous. Neck: No stridor. No cervical spine tenderness to palpation. Cardiovascular: Normal rate, regular rhythm. Grossly normal heart sounds.  Good peripheral circulation. Respiratory: Normal respiratory effort.  No retractions. Lungs CTAB. Gastrointestinal: Soft , nondistended, nontender to palpation. No CVA tenderness. Musculoskeletal: No lower extremity tenderness nor edema.  No joint effusions. No signs of acute trauma. Neurologic:  Normal speech and language. No gross focal neurologic deficits are appreciated. No gait instability noted. Skin:  Skin is warm, dry and intact. No rash noted. Psychiatric: Mood and affect are normal. Speech and behavior are normal. ____________________________________________   LABS (all labs ordered are listed, but only abnormal results are displayed)  Labs Reviewed  BASIC METABOLIC PANEL - Abnormal; Notable for the following components:      Result Value   Anion gap 4 (*)    All other components within normal limits  CBC - Abnormal; Notable for the following components:   RBC 3.95 (*)    Hemoglobin 12.2 (*)    HCT 35.8 (*)    All other components within normal limits  D-DIMER, QUANTITATIVE - Abnormal; Notable for the following components:   D-Dimer, Quant 0.55 (*)    All other components within normal limits  TROPONIN I (HIGH SENSITIVITY)  TROPONIN I (HIGH SENSITIVITY)   ____________________________________________  12 Lead EKG  Sinus rhythm, rate of 66 bpm.  Normal axis and intervals.  No evidence of acute ischemia. ____________________________________________  RADIOLOGY  ED MD interpretation: 2 view CXR reviewed by me with chronic pulmonary nodules without evidence of acute  cardiopulmonary pathology.  Official radiology report(s): DG Chest 2 View  Result Date: 09/22/2020 CLINICAL DATA:  Chest pain.  RIGHT-sided chest pain for 1 week. EXAM: CHEST - 2 VIEW COMPARISON:  February 28, 2019.  June 15, 2020. FINDINGS: Trachea is midline. Cardiomediastinal contours and hilar structures are stable. Pulmonary nodules in the RIGHT chest not well seen on current imaging. Suspect nodular density in the RIGHT mid chest approximately 1.4-1.5 cm may correspond to the largest of these areas. EKG leads project over the chest. No lobar level consolidative changes.  No sign of pleural effusion. On limited assessment no acute skeletal process. IMPRESSION: No acute cardiopulmonary disease. Pulmonary nodules better displayed on recent chest CT. Again, bronchogenic neoplasm not excluded. Electronically Signed   By: Zetta Bills M.D.   On: 09/22/2020 11:02    ____________________________________________   PROCEDURES and INTERVENTIONS  Procedure(s) performed (including Critical Care):  Procedures  Medications  acetaminophen (TYLENOL) tablet 1,000 mg (1,000 mg Oral Given 09/22/20 1101)    ____________________________________________   MDM / ED COURSE   75 year old male without history of ACS present to the ED with atypical chest pains, without evidence of acute pathology, and ultimately amenable to outpatient management.  Normal vitals.  Exam reassuring without evidence of distress, neurologic or vascular  deficits.  No skin changes, signs of trauma or herpes zoster.  Work-up is benign without evidence of ACS.  Age-adjusted D-dimer effectively rules out acute PE.  Resolution of chest pain after Tylenol and patient tolerates p.o. intake.  I see no barriers to outpatient management this time.  We will discharge with return precautions.   Clinical Course as of 09/22/20 1424  Tue Sep 22, 2020  1129 Age-adjusted D-dimer is negative. [DS]  3094 Reassessed.  Patient comfortably asleep.   Awakens to me entering the room and reports resolution of chest pain.  Requesting food. [DS]    Clinical Course User Index [DS] Vladimir Crofts, MD    ____________________________________________   FINAL CLINICAL IMPRESSION(S) / ED DIAGNOSES  Final diagnoses:  Other chest pain     ED Discharge Orders     None        Anyeli Hockenbury Tamala Julian   Note:  This document was prepared using Dragon voice recognition software and may include unintentional dictation errors.    Vladimir Crofts, MD 09/22/20 1425

## 2020-09-22 NOTE — ED Triage Notes (Signed)
Pt to ED via POV stating that he has been having right sided chest pain x 1 week. Pt states that he has been taking OTC pain medication and that it would ease the pain off but then the pain would come back. Pt reports that the pain has gotten worse. Pt  denies any other symptoms.

## 2020-09-22 NOTE — ED Notes (Signed)
Pt provided with ginger ale and graham crackers for PO challenge.

## 2020-09-22 NOTE — ED Notes (Signed)
Patient ambulated to and from room commode with a steady gait.

## 2020-12-01 ENCOUNTER — Ambulatory Visit: Admission: RE | Admit: 2020-12-01 | Payer: Medicare Other | Source: Ambulatory Visit

## 2020-12-20 NOTE — Progress Notes (Addendum)
[place]  Telephone:([phone]) [phone] Fax:(336) [phone]  ID: Frank Buckley OB: 1946/03/07  MR#: [id number]  BTD#:[id number]  Patient Care Team: Marguerita Merles, MD as PCP - General (Family Medicine)  CHIEF COMPLAINT: MGUS, pulmonary nodules.  INTERVAL HISTORY: Patient returns to clinic today for routine 45-monthevaluation and discussion of his laboratory results.  He continues to feel well and remains asymptomatic.  He has no neurologic complaints.  He denies any recent fevers or illnesses.  He has a good appetite and denies weight loss.  He denies any chest pain, shortness of breath, cough, or hemoptysis.  He denies any nausea, vomiting, constipation, or diarrhea.  He has no urinary complaints.  Patient offers no specific complaints today.  REVIEW OF SYSTEMS:   Review of Systems  Constitutional: Negative.  Negative for fever, malaise/fatigue and weight loss.  Respiratory: Negative.  Negative for cough, hemoptysis and shortness of breath.   Cardiovascular: Negative.  Negative for chest pain and leg swelling.  Gastrointestinal: Negative.  Negative for abdominal pain.  Genitourinary: Negative.  Negative for hematuria.  Musculoskeletal: Negative.  Negative for back pain and joint pain.  Skin: Negative.  Negative for rash.  Neurological: Negative.  Negative for focal weakness, weakness and headaches.  Psychiatric/Behavioral: Negative.  The patient is not nervous/anxious.    As per HPI. Otherwise, a complete review of systems is negative.  PAST MEDICAL HISTORY: Past Medical History:  Diagnosis Date   Borderline diabetic    [place]    GERD (gastroesophageal reflux disease)     PAST SURGICAL HISTORY: Past Surgical History:  Procedure Laterality Date   CERVICAL FUSION      FAMILY HISTORY: No family history on file.  ADVANCED DIRECTIVES (Y/N):  N  HEALTH MAINTENANCE: Social History   Tobacco Use   Smoking status: Former   Smokeless tobacco: Never   VScientific laboratory technicianUse: Never used  Substance Use Topics   Alcohol use: No   Drug use: Never     Colonoscopy:  PAP:  Bone density:  Lipid panel:  Allergies  Allergen Reactions   Penicillins Rash    Current Outpatient Medications  Medication Sig Dispense Refill   doxazosin (CARDURA) 8 MG tablet Take 8 mg by mouth daily.     ergocalciferol (VITAMIN D2) 1.25 MG (50000 UT) capsule Take 1 capsule (50,000 Units total) by mouth once a week. 12 capsule 1   finasteride (PROSCAR) 5 MG tablet Take 5 mg by mouth daily.     HYDROcodone-acetaminophen (NORCO/VICODIN) 5-325 MG tablet Take 1 tablet by mouth every 4 (four) hours as needed for moderate pain. 16 tablet 0   ibuprofen (ADVIL,MOTRIN) 600 MG tablet Take 1 tablet (600 mg total) by mouth every 6 (six) hours as needed for moderate pain. 30 tablet 0   Miconazole Nitrate (LOTRIMIN AF POWDER) 2 % AERP Apply 1 application topically 2 (two) times daily. 85 g 0   omeprazole (PRILOSEC) 20 MG capsule Take 20 mg by mouth daily.     pravastatin (PRAVACHOL) 40 MG tablet SMARTSIG:1 Tablet(s) By Mouth Every Evening     traMADol (ULTRAM) 50 MG tablet Take 1 tablet (50 mg total) by mouth every 6 (six) hours as needed. 15 tablet 0   furosemide (LASIX) 20 MG tablet Take 1 tablet (20 mg total) by mouth daily for 3 days. 3 tablet 0   No current facility-administered medications for this visit.    OBJECTIVE: Vitals:   12/22/20 1049  BP: (!) 88/60  Pulse: 79  Resp: 18  Temp: 97.9 F (36.6 C)  SpO2: 97%     Body mass index is 25.66 kg/m.    ECOG FS:0 - Asymptomatic  General: Well-developed, well-nourished, no acute distress. Eyes: Pink conjunctiva, anicteric sclera. HEENT: Normocephalic, moist mucous membranes. Lungs: No audible wheezing or coughing. Heart: Regular rate and rhythm. Abdomen: Soft, nontender, no obvious distention. Musculoskeletal: No edema, cyanosis, or clubbing. Neuro: Alert, answering all questions appropriately. Cranial  nerves grossly intact. Skin: No rashes or petechiae noted. Psych: Normal affect.   LAB RESULTS:  Lab Results  Component Value Date   NA 138 12/22/2020   K 3.6 12/22/2020   CL 104 12/22/2020   CO2 28 12/22/2020   GLUCOSE 91 12/22/2020   BUN 11 12/22/2020   CREATININE 0.73 12/22/2020   CALCIUM 8.8 (L) 12/22/2020   PROT 7.3 02/27/2020   ALBUMIN 3.7 02/27/2020   AST 22 02/27/2020   ALT 16 02/27/2020   ALKPHOS 70 02/27/2020   BILITOT 0.6 02/27/2020   GFRNONAA >60 12/22/2020   GFRAA >60 12/18/2019    Lab Results  Component Value Date   WBC 4.5 12/22/2020   NEUTROABS 2.6 12/22/2020   HGB 11.5 (L) 12/22/2020   HCT 34.4 (L) 12/22/2020   MCV 94.8 12/22/2020   PLT 201 12/22/2020   Lab Results  Component Value Date   TOTALPROTELP 7.1 06/11/2020   ALBUMINELP 3.7 06/11/2020   A1GS 0.2 06/11/2020   A2GS 0.7 06/11/2020   BETS 0.9 06/11/2020   GAMS 1.6 06/11/2020   MSPIKE 0.6 (H) 06/11/2020   SPEI Comment 06/11/2020     STUDIES: No results found.   ASSESSMENT: MGUS, pulmonary nodules.  PLAN:    1. MGUS: Bone marrow biopsy completed on March 13, 2018 revealed a slightly hypercellular bone marrow with trilineage hematopoiesis.  Patient had 5% plasma cells.  Cytogenetic studies were normal.  Metastatic bone survey on February 02, 2018 revealed widespread osseous lesions of unclear etiology.  Patient's most recent M spike is unchanged at 0.6.  Today's result is pending.  He continues to have a mild IgM predominance and elevated kappa/lambda light chain ratio both of which are unchanged as well.  He has no evidence of endorgan damage.  No intervention is needed at this time.  Return to clinic in 6 months with repeat laboratory work and further evaluation. 2.  Pulmonary nodules: CT scan results from December 18, 2019 reviewed independently with the lesion in his right upper lobe essentially unchanged at 1.8 x 1.2 cm.  Lesion in his right lower lobe is also unchanged in size.   Previously, PET scan results from March 14, 2019 did not reveal any significant hypermetabolism.  Can consider repeat imaging in the near future.   3.  Osseous lesions: Per metastatic bone survey in November 2019.  Given results of bone marrow biopsy and a normal PSA, this is not myeloma or prostate cancer.  PET scan is negative. 4.  Hypotension: Patient has been instructed to call his primary care physician for adjustment of his medications.  I spent a total of 20 minutes reviewing chart data, face-to-face evaluation with the patient, counseling and coordination of care as detailed above.   Patient expressed understanding and was in agreement with this plan. He also understands that He can call clinic at any time with any questions, concerns, or complaints.    Lloyd Huger, MD   12/22/2020 6:49 PM

## 2020-12-21 ENCOUNTER — Other Ambulatory Visit: Payer: Self-pay

## 2020-12-21 DIAGNOSIS — D509 Iron deficiency anemia, unspecified: Secondary | ICD-10-CM

## 2020-12-21 DIAGNOSIS — D472 Monoclonal gammopathy: Secondary | ICD-10-CM

## 2020-12-22 ENCOUNTER — Inpatient Hospital Stay: Payer: Medicare Other | Attending: Oncology

## 2020-12-22 ENCOUNTER — Inpatient Hospital Stay (HOSPITAL_BASED_OUTPATIENT_CLINIC_OR_DEPARTMENT_OTHER): Payer: Medicare Other | Admitting: Oncology

## 2020-12-22 VITALS — BP 88/60 | HR 79 | Temp 97.9°F | Resp 18 | Wt 178.8 lb

## 2020-12-22 DIAGNOSIS — D472 Monoclonal gammopathy: Secondary | ICD-10-CM

## 2020-12-22 DIAGNOSIS — R911 Solitary pulmonary nodule: Secondary | ICD-10-CM | POA: Diagnosis not present

## 2020-12-22 DIAGNOSIS — I959 Hypotension, unspecified: Secondary | ICD-10-CM | POA: Insufficient documentation

## 2020-12-22 DIAGNOSIS — Z87891 Personal history of nicotine dependence: Secondary | ICD-10-CM | POA: Insufficient documentation

## 2020-12-22 DIAGNOSIS — R918 Other nonspecific abnormal finding of lung field: Secondary | ICD-10-CM | POA: Diagnosis not present

## 2020-12-22 LAB — CBC WITH DIFFERENTIAL/PLATELET
Abs Immature Granulocytes: 0.01 10*3/uL (ref 0.00–0.07)
Basophils Absolute: 0 10*3/uL (ref 0.0–0.1)
Basophils Relative: 1 %
Eosinophils Absolute: 0.2 10*3/uL (ref 0.0–0.5)
Eosinophils Relative: 5 %
HCT: 34.4 % — ABNORMAL LOW (ref 39.0–52.0)
Hemoglobin: 11.5 g/dL — ABNORMAL LOW (ref 13.0–17.0)
Immature Granulocytes: 0 %
Lymphocytes Relative: 29 %
Lymphs Abs: 1.3 10*3/uL (ref 0.7–4.0)
MCH: 31.7 pg (ref 26.0–34.0)
MCHC: 33.4 g/dL (ref 30.0–36.0)
MCV: 94.8 fL (ref 80.0–100.0)
Monocytes Absolute: 0.4 10*3/uL (ref 0.1–1.0)
Monocytes Relative: 9 %
Neutro Abs: 2.6 10*3/uL (ref 1.7–7.7)
Neutrophils Relative %: 56 %
Platelets: 201 10*3/uL (ref 150–400)
RBC: 3.63 MIL/uL — ABNORMAL LOW (ref 4.22–5.81)
RDW: 12.2 % (ref 11.5–15.5)
WBC: 4.5 10*3/uL (ref 4.0–10.5)
nRBC: 0 % (ref 0.0–0.2)

## 2020-12-22 LAB — BASIC METABOLIC PANEL
Anion gap: 6 (ref 5–15)
BUN: 11 mg/dL (ref 8–23)
CO2: 28 mmol/L (ref 22–32)
Calcium: 8.8 mg/dL — ABNORMAL LOW (ref 8.9–10.3)
Chloride: 104 mmol/L (ref 98–111)
Creatinine, Ser: 0.73 mg/dL (ref 0.61–1.24)
GFR, Estimated: >60 mL/min (ref >60)
Glucose, Bld: 91 mg/dL (ref 70–99)
Potassium: 3.6 mmol/L (ref 3.5–5.1)
Sodium: 138 mmol/L (ref 135–145)

## 2020-12-22 NOTE — Progress Notes (Signed)
Pt has no concerns at this time. 

## 2020-12-23 LAB — KAPPA/LAMBDA LIGHT CHAINS
Kappa free light chain: 23.9 mg/L — ABNORMAL HIGH (ref 3.3–19.4)
Kappa, lambda light chain ratio: 2.17 — ABNORMAL HIGH (ref 0.26–1.65)
Lambda free light chains: 11 mg/L (ref 5.7–26.3)

## 2020-12-26 LAB — PROTEIN ELECTROPHORESIS, SERUM
A/G Ratio: 1.1 (ref 0.7–1.7)
Albumin ELP: 3.4 g/dL (ref 2.9–4.4)
Alpha-1-Globulin: 0.2 g/dL (ref 0.0–0.4)
Alpha-2-Globulin: 0.6 g/dL (ref 0.4–1.0)
Beta Globulin: 0.8 g/dL (ref 0.7–1.3)
Gamma Globulin: 1.6 g/dL (ref 0.4–1.8)
Globulin, Total: 3.2 g/dL (ref 2.2–3.9)
M-Spike, %: 0.7 g/dL — ABNORMAL HIGH
Total Protein ELP: 6.6 g/dL (ref 6.0–8.5)

## 2021-03-11 ENCOUNTER — Encounter: Payer: Self-pay | Admitting: Oncology

## 2021-03-12 ENCOUNTER — Encounter: Payer: Self-pay | Admitting: Oncology

## 2021-03-15 ENCOUNTER — Encounter: Payer: Self-pay | Admitting: Oncology

## 2021-03-18 NOTE — Progress Notes (Deleted)
03/19/2021 9:31 PM   Frank Buckley [birth year] [id number]  Referring provider: Marguerita Merles, [address] [place],  [place] [id number]  No chief complaint on file.  Urological history: 1. High risk hematuria -former smoker -CTU 2019 - NED -cysto 2019 - NED -urine cytology 2019 - negative  2. BPH with LU TS -cysto 2019 - Moderate lateral lobe enlargement with hypervascularity prostatic urethra.  Mild elevation bladder neck. -I PSS *** -PVR *** -Managed with doxazosin 8 mg daily and finasteride 5 mg daily  HPI: Frank Buckley is a 75 y.o. male who presents today for yearly visit.  UA ***  PMH: Past Medical History:  Diagnosis Date   Borderline diabetic    Cancer ([place])    GERD (gastroesophageal reflux disease)     Surgical History: Past Surgical History:  Procedure Laterality Date   CERVICAL FUSION      Home Medications:  Allergies as of 03/19/2021       Reactions   Penicillins Rash        Medication List        Accurate as of March 18, 2021  9:31 PM. If you have any questions, ask your nurse or doctor.          doxazosin 8 MG tablet Commonly known as: CARDURA Take 8 mg by mouth daily.   ergocalciferol 1.25 MG (50000 UT) capsule Commonly known as: VITAMIN D2 Take 1 capsule (50,000 Units total) by mouth once a week.   finasteride 5 MG tablet Commonly known as: PROSCAR Take 5 mg by mouth daily.   furosemide 20 MG tablet Commonly known as: LASIX Take 1 tablet (20 mg total) by mouth daily for 3 days.   HYDROcodone-acetaminophen 5-325 MG tablet Commonly known as: NORCO/VICODIN Take 1 tablet by mouth every 4 (four) hours as needed for moderate pain.   ibuprofen 600 MG tablet Commonly known as: ADVIL Take 1 tablet (600 mg total) by mouth every 6 (six) hours as needed for moderate pain.   Lotrimin AF Powder 2 % Aerp Generic drug: Miconazole Nitrate Apply 1 application topically 2 (two) times daily.   omeprazole 20 MG  capsule Commonly known as: PRILOSEC Take 20 mg by mouth daily.   pravastatin 40 MG tablet Commonly known as: PRAVACHOL SMARTSIG:1 Tablet(s) By Mouth Every Evening   traMADol 50 MG tablet Commonly known as: ULTRAM Take 1 tablet (50 mg total) by mouth every 6 (six) hours as needed.        Allergies:  Allergies  Allergen Reactions   Penicillins Rash    Family History: No family history on file.  Social History:  reports that he has quit smoking. He has never used smokeless tobacco. He reports that he does not drink alcohol and does not use drugs.  ROS: For pertinent review of systems please refer to history of present illness  Physical Exam: There were no vitals taken for this visit.  Constitutional:  Well nourished. Alert and oriented, No acute distress. HEENT: [place] AT, moist mucus membranes.  Trachea midline Cardiovascular: No clubbing, cyanosis, or edema. Respiratory: Normal respiratory effort, no increased work of breathing. GI: Abdomen is soft, non tender, non distended, no abdominal masses. Liver and spleen not palpable.  No hernias appreciated.  Stool sample for occult testing is not indicated.   GU: No CVA tenderness.  No bladder fullness or masses.  Patient with circumcised/uncircumcised phallus. ***Foreskin easily retracted***  Urethral meatus is patent.  No penile discharge. No penile  lesions or rashes. Scrotum without lesions, cysts, rashes and/or edema.  Testicles are located scrotally bilaterally. No masses are appreciated in the testicles. Left and right epididymis are normal. Rectal: Patient with  normal sphincter tone. Anus and perineum without scarring or rashes. No rectal masses are appreciated. Prostate is approximately *** grams, *** nodules are appreciated. Seminal vesicles are normal. Skin: No rashes, bruises or suspicious lesions. Lymph: No inguinal adenopathy. Neurologic: Grossly intact, no focal deficits, moving all 4 extremities. Psychiatric: Normal  mood and affect.   Laboratory Data: Lab Results  Component Value Date   WBC 4.5 12/22/2020   HGB 11.5 (L) 12/22/2020   HCT 34.4 (L) 12/22/2020   MCV 94.8 12/22/2020   PLT 201 12/22/2020    Lab Results  Component Value Date   CREATININE 0.73 12/22/2020    Urinalysis *** I have reviewed the labs.  Pertinent Imaging N/A   Assessment & Plan:   1. High risk hematuria -Hematuria work up completed in 12/2017 - findings positive for hypervascular prostate -No report of gross hematuria  -UA today negative   2. BPH with LUTS -DRE benign -UA benign -PVR < 300 cc -symptoms - *** -most bothersome symptoms are *** -continue conservative management, avoiding bladder irritants and timed voiding's -Continue doxazosin 8 mg daily and finasteride 5 mg daily***:refills given      No follow-ups on file.  These notes generated with voice recognition software. I apologize for typographical errors.  Zara Council, PA-C  [facility] [address] [place]  [place], [place] [address] [phone]

## 2021-03-19 ENCOUNTER — Ambulatory Visit: Payer: Self-pay | Admitting: Urology

## 2021-03-22 ENCOUNTER — Encounter: Payer: Self-pay | Admitting: Urology

## 2021-05-20 ENCOUNTER — Encounter: Payer: Self-pay | Admitting: Oncology

## 2021-05-25 NOTE — Progress Notes (Signed)
05/26/2021 2:01 PM   Frank Buckley Feb 14, 1946 [id number]  Referring provider: Marguerita Merles, [address],  [place] [address]  Chief Complaint  Patient presents with   Benign Prostatic Hypertrophy   Urological history: 1. 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 BPH with LU TS -I PSS 19/4 -PVR 11 mL -managed with doxazosin 8 mg daily and finasteride 5 mg daily  HPI: Frank Buckley is a 76 y.o. male who presents today for yearly visit.  He is having issues with ED and would like to retry sildenafil.   He is having some intermittent dysuria with straining to urinate.  He is also having a weak urinary stream.  Patient denies any modifying or aggravating factors.  Patient denies any gross hematuria, dysuria or suprapubic/flank pain.  Patient denies any fevers, chills, nausea or vomiting.    UA negative for micro heme    IPSS     Row Name 05/26/21 1300         International Prostate Symptom Score   How often have you had the sensation of not emptying your bladder? About half the time     How often have you had to urinate less than every two hours? Less than half the time     How often have you found you stopped and started again several times when you urinated? Less than 1 in 5 times     How often have you found it difficult to postpone urination? About half the time     How often have you had a weak urinary stream? Less than half the time     How often have you had to strain to start urination? Almost always     How many times did you typically get up at night to urinate? 3 Times     Total IPSS Score 19       Quality of Life due to urinary symptoms   If you were to spend the rest of your life with your urinary condition just the way it is now how would you feel about that? Mostly Disatisfied               Score:  1-7 Mild 8-19 Moderate 20-35 Severe    PMH: Past Medical History:  Diagnosis Date   Borderline diabetic    Cancer ([facility])    GERD (gastroesophageal reflux disease)     Surgical History: Past Surgical History:  Procedure Laterality Date   CERVICAL FUSION      Home Medications:  Allergies as of 05/26/2021       Reactions   Penicillins Rash        Medication List        Accurate as of May 26, 2021 11:59 PM. If you have any questions, ask your nurse or doctor.          doxazosin 8 MG tablet Commonly known as: CARDURA Take 1 tablet (8 mg total) by mouth daily.   ergocalciferol 1.25 MG (50000 UT) capsule Commonly known as: VITAMIN D2 Take 1 capsule (50,000 Units total) by mouth once a week.   finasteride 5 MG tablet Commonly known as: PROSCAR Take 1 tablet (5 mg total) by mouth daily.   furosemide 20 MG tablet Commonly known as: LASIX Take 1 tablet (20 mg total) by  mouth daily for 3 days.   HYDROcodone-acetaminophen 5-325 MG tablet Commonly known as: NORCO/VICODIN Take 1 tablet by mouth every 4 (four) hours as needed for moderate pain.   ibuprofen 600 MG tablet Commonly known as: ADVIL Take 1 tablet (600 mg total) by mouth every 6 (six) hours as needed for moderate pain.   Lotrimin AF Powder 2 % Aerp Generic drug: Miconazole Nitrate Apply 1 application topically 2 (two) times daily.   omeprazole 20 MG capsule Commonly known as: PRILOSEC Take 20 mg by mouth daily.   pravastatin 40 MG tablet Commonly known as: PRAVACHOL SMARTSIG:1 Tablet(s) By Mouth Every Evening   sildenafil 20 MG tablet Commonly known as: REVATIO Take 3 to 5 tablets two hours before intercouse on an empty stomach.  Do not take with nitrates. Started by: Zara Council, PA-C   traMADol 50 MG tablet Commonly known as: ULTRAM Take 1 tablet (50 mg total) by mouth every 6 (six) hours as needed.        Allergies:  Allergies  Allergen  Reactions   Penicillins Rash    Family History: No family history on file.  Social History:  reports that he has quit smoking. He has never used smokeless tobacco. He reports that he does not drink alcohol and does not use drugs.  ROS: For pertinent review of systems please refer to history of present illness  Physical Exam: BP 128/79    Pulse (!) 106    Ht 5\' 10"  (1.778 m)    Wt 185 lb (83.9 kg)    BMI 26.54 kg/m   Constitutional:  Well nourished. Alert and oriented, No acute distress. HEENT: [place] AT, mask in place.  Trachea midline Cardiovascular: No clubbing, cyanosis, or edema. Respiratory: Normal respiratory effort, no increased work of breathing. Neurologic: Grossly intact, no focal deficits, moving all 4 extremities. Psychiatric: Normal mood and affect.   Laboratory Data: Lab Results  Component Value Date   WBC 4.5 12/22/2020   HGB 11.5 (L) 12/22/2020   HCT 34.4 (L) 12/22/2020   MCV 94.8 12/22/2020   PLT 201 12/22/2020    Lab Results  Component Value Date   CREATININE 0.73 12/22/2020    Urinalysis Component     Latest Ref Rng & Units 05/26/2021  Specific Gravity, UA     1.005 - 1.030 1.020  pH, UA     5.0 - 7.5 6.5  Color, UA     Yellow Yellow  Appearance Ur     Clear Hazy (A)  Leukocytes,UA     Negative Trace (A)  Protein,UA     Negative/Trace Negative  Glucose, UA     Negative Negative  Ketones, UA     Negative Negative  RBC, UA     Negative Negative  Bilirubin, UA     Negative Negative  Urobilinogen, Ur     0.2 - 1.0 mg/dL 0.2  Nitrite, UA     Negative Negative  Microscopic Examination      See below:   Component     Latest Ref Rng & Units 05/26/2021  WBC, UA     0 - 5 /hpf 0-5  RBC     0 - 2 /hpf None seen  Epithelial Cells (non renal)     0 - 10 /hpf 0-10  Bacteria, UA     None seen/Few None seen  I have reviewed the labs.  Pertinent Imaging  05/26/21 13:55  Scan Result 71mL     Assessment & Plan:  1. High risk  hematuria -Hematuria work up completed in 12/2017 - findings positive for hypervascular prostate -No report of gross hematuria  -UA today negative     2. BPH with LU TS -Continue doxazosin 8 mg and finasteride 5 mg daily-refills given -He is not wanting to pursue cystoscopy/TRUS for possible work-up for bladder outlet procedure at this time he states he will continue with the medication  3. ED -he would like to retry the sildenafil  -prescription given for sildenafil    Return in 1 year (on 05/26/2022) for IPSS, SHIM and exam.  These notes generated with voice recognition software. I apologize for typographical errors.  Zara Council, PA-C  [facility] [address] [place]  [place], [place] [address] [phone]

## 2021-05-26 ENCOUNTER — Ambulatory Visit (INDEPENDENT_AMBULATORY_CARE_PROVIDER_SITE_OTHER): Payer: Medicare Other | Admitting: Urology

## 2021-05-26 ENCOUNTER — Encounter: Payer: Self-pay | Admitting: Urology

## 2021-05-26 ENCOUNTER — Other Ambulatory Visit: Payer: Self-pay

## 2021-05-26 VITALS — BP 128/79 | HR 106 | Ht 70.0 in | Wt 185.0 lb

## 2021-05-26 DIAGNOSIS — N401 Enlarged prostate with lower urinary tract symptoms: Secondary | ICD-10-CM

## 2021-05-26 DIAGNOSIS — N138 Other obstructive and reflux uropathy: Secondary | ICD-10-CM

## 2021-05-26 DIAGNOSIS — R319 Hematuria, unspecified: Secondary | ICD-10-CM

## 2021-05-26 DIAGNOSIS — N5203 Combined arterial insufficiency and corporo-venous occlusive erectile dysfunction: Secondary | ICD-10-CM | POA: Diagnosis not present

## 2021-05-26 LAB — BLADDER SCAN AMB NON-IMAGING

## 2021-05-26 MED ORDER — FINASTERIDE 5 MG PO TABS: 5.0000 mg | ORAL_TABLET | Freq: Every day | ORAL | 3 refills | Status: DC

## 2021-05-26 MED ORDER — FINASTERIDE 5 MG PO TABS: 5.0000 mg | ORAL_TABLET | Freq: Every day | ORAL | 3 refills | Status: AC

## 2021-05-26 MED ORDER — DOXAZOSIN MESYLATE 8 MG PO TABS: 8.0000 mg | ORAL_TABLET | Freq: Every day | ORAL | 3 refills | Status: DC

## 2021-05-26 MED ORDER — SILDENAFIL CITRATE 20 MG PO TABS: ORAL_TABLET | ORAL | 3 refills | Status: DC

## 2021-05-27 LAB — URINALYSIS, COMPLETE
Bilirubin, UA: NEGATIVE
Glucose, UA: NEGATIVE
Ketones, UA: NEGATIVE
Nitrite, UA: NEGATIVE
Protein,UA: NEGATIVE
RBC, UA: NEGATIVE
Specific Gravity, UA: 1.02 (ref 1.005–1.030)
Urobilinogen, Ur: 0.2 mg/dL (ref 0.2–1.0)
pH, UA: 6.5 (ref 5.0–7.5)

## 2021-05-27 LAB — MICROSCOPIC EXAMINATION
Bacteria, UA: NONE SEEN
RBC, Urine: NONE SEEN /hpf (ref 0–2)

## 2021-06-14 ENCOUNTER — Encounter: Payer: Self-pay | Admitting: Oncology

## 2021-06-15 ENCOUNTER — Other Ambulatory Visit: Payer: Self-pay | Admitting: *Deleted

## 2021-06-15 DIAGNOSIS — D472 Monoclonal gammopathy: Secondary | ICD-10-CM

## 2021-06-21 ENCOUNTER — Other Ambulatory Visit: Payer: Self-pay

## 2021-06-21 ENCOUNTER — Inpatient Hospital Stay: Payer: Medicare Other | Attending: Nurse Practitioner

## 2021-06-21 DIAGNOSIS — R0602 Shortness of breath: Secondary | ICD-10-CM | POA: Diagnosis not present

## 2021-06-21 DIAGNOSIS — Z87891 Personal history of nicotine dependence: Secondary | ICD-10-CM | POA: Insufficient documentation

## 2021-06-21 DIAGNOSIS — D472 Monoclonal gammopathy: Secondary | ICD-10-CM | POA: Diagnosis present

## 2021-06-21 DIAGNOSIS — R519 Headache, unspecified: Secondary | ICD-10-CM | POA: Insufficient documentation

## 2021-06-21 DIAGNOSIS — I959 Hypotension, unspecified: Secondary | ICD-10-CM | POA: Diagnosis not present

## 2021-06-21 DIAGNOSIS — M25512 Pain in left shoulder: Secondary | ICD-10-CM | POA: Insufficient documentation

## 2021-06-21 DIAGNOSIS — G8929 Other chronic pain: Secondary | ICD-10-CM | POA: Insufficient documentation

## 2021-06-21 DIAGNOSIS — R059 Cough, unspecified: Secondary | ICD-10-CM | POA: Diagnosis not present

## 2021-06-21 DIAGNOSIS — R5383 Other fatigue: Secondary | ICD-10-CM | POA: Insufficient documentation

## 2021-06-21 DIAGNOSIS — R918 Other nonspecific abnormal finding of lung field: Secondary | ICD-10-CM | POA: Diagnosis not present

## 2021-06-21 DIAGNOSIS — R634 Abnormal weight loss: Secondary | ICD-10-CM | POA: Diagnosis not present

## 2021-06-21 LAB — CBC WITH DIFFERENTIAL/PLATELET
Abs Immature Granulocytes: 0 10*3/uL (ref 0.00–0.07)
Basophils Absolute: 0.1 10*3/uL (ref 0.0–0.1)
Basophils Relative: 1 %
Eosinophils Absolute: 0.2 10*3/uL (ref 0.0–0.5)
Eosinophils Relative: 3 %
HCT: 35.9 % — ABNORMAL LOW (ref 39.0–52.0)
Hemoglobin: 11.9 g/dL — ABNORMAL LOW (ref 13.0–17.0)
Immature Granulocytes: 0 %
Lymphocytes Relative: 26 %
Lymphs Abs: 1.4 10*3/uL (ref 0.7–4.0)
MCH: 31.3 pg (ref 26.0–34.0)
MCHC: 33.1 g/dL (ref 30.0–36.0)
MCV: 94.5 fL (ref 80.0–100.0)
Monocytes Absolute: 0.4 10*3/uL (ref 0.1–1.0)
Monocytes Relative: 8 %
Neutro Abs: 3.3 10*3/uL (ref 1.7–7.7)
Neutrophils Relative %: 62 %
Platelets: 251 10*3/uL (ref 150–400)
RBC: 3.8 MIL/uL — ABNORMAL LOW (ref 4.22–5.81)
RDW: 12.8 % (ref 11.5–15.5)
WBC: 5.4 10*3/uL (ref 4.0–10.5)
nRBC: 0 % (ref 0.0–0.2)

## 2021-06-21 LAB — BASIC METABOLIC PANEL
Anion gap: 6 (ref 5–15)
BUN: 16 mg/dL (ref 8–23)
CO2: 27 mmol/L (ref 22–32)
Calcium: 9.1 mg/dL (ref 8.9–10.3)
Chloride: 103 mmol/L (ref 98–111)
Creatinine, Ser: 0.77 mg/dL (ref 0.61–1.24)
GFR, Estimated: >60 mL/min (ref >60)
Glucose, Bld: 93 mg/dL (ref 70–99)
Potassium: 3.9 mmol/L (ref 3.5–5.1)
Sodium: 136 mmol/L (ref 135–145)

## 2021-06-22 LAB — KAPPA/LAMBDA LIGHT CHAINS
Kappa free light chain: 29.1 mg/L — ABNORMAL HIGH (ref 3.3–19.4)
Kappa, lambda light chain ratio: 2.06 — ABNORMAL HIGH (ref 0.26–1.65)
Lambda free light chains: 14.1 mg/L (ref 5.7–26.3)

## 2021-06-23 LAB — PROTEIN ELECTROPHORESIS, SERUM
A/G Ratio: 0.9 (ref 0.7–1.7)
Albumin ELP: 3.5 g/dL (ref 2.9–4.4)
Alpha-1-Globulin: 0.3 g/dL (ref 0.0–0.4)
Alpha-2-Globulin: 0.8 g/dL (ref 0.4–1.0)
Beta Globulin: 0.9 g/dL (ref 0.7–1.3)
Gamma Globulin: 1.7 g/dL (ref 0.4–1.8)
Globulin, Total: 3.7 g/dL (ref 2.2–3.9)
M-Spike, %: 0.6 g/dL — ABNORMAL HIGH
Total Protein ELP: 7.2 g/dL (ref 6.0–8.5)

## 2021-06-28 ENCOUNTER — Other Ambulatory Visit: Payer: Self-pay

## 2021-06-28 ENCOUNTER — Inpatient Hospital Stay (HOSPITAL_BASED_OUTPATIENT_CLINIC_OR_DEPARTMENT_OTHER): Payer: Medicare Other | Admitting: Medical Oncology

## 2021-06-28 ENCOUNTER — Encounter: Payer: Self-pay | Admitting: Medical Oncology

## 2021-06-28 VITALS — BP 104/61 | HR 76 | Temp 95.8°F | Ht 70.0 in | Wt 169.0 lb

## 2021-06-28 DIAGNOSIS — R918 Other nonspecific abnormal finding of lung field: Secondary | ICD-10-CM | POA: Diagnosis not present

## 2021-06-28 DIAGNOSIS — R5383 Other fatigue: Secondary | ICD-10-CM

## 2021-06-28 DIAGNOSIS — G8929 Other chronic pain: Secondary | ICD-10-CM

## 2021-06-28 DIAGNOSIS — M25512 Pain in left shoulder: Secondary | ICD-10-CM

## 2021-06-28 DIAGNOSIS — D472 Monoclonal gammopathy: Secondary | ICD-10-CM | POA: Diagnosis not present

## 2021-06-28 DIAGNOSIS — R634 Abnormal weight loss: Secondary | ICD-10-CM | POA: Diagnosis not present

## 2021-06-28 DIAGNOSIS — I959 Hypotension, unspecified: Secondary | ICD-10-CM

## 2021-06-28 DIAGNOSIS — R519 Headache, unspecified: Secondary | ICD-10-CM | POA: Diagnosis not present

## 2021-06-28 DIAGNOSIS — F4321 Adjustment disorder with depressed mood: Secondary | ICD-10-CM

## 2021-06-28 NOTE — Progress Notes (Signed)
16lb weight loss since 05/26/21. Pt reports appetite has been normal but people have been commenting on his weight loss.  ?

## 2021-06-28 NOTE — Progress Notes (Signed)
C/O OF BEING MORE TIRED THAN USUAL. ? ?C/O A LUMP IN HIS LEFT SHOULDER, PAINFUL AT TIMES. ?

## 2021-06-28 NOTE — Progress Notes (Signed)
?[facility]  ?[phone] [phone] ? ?ID: Frank Buckley OB: October 04, 1945  MR#: [id number]  XNA#:[id number] ? ?Patient Care Team: ?Marguerita Merles, MD as PCP - General (Family Medicine) ? ?CHIEF COMPLAINT: MGUS, pulmonary nodules. ? ?INTERVAL HISTORY: Patient returns to clinic today for routine 36-month evaluation and discussion of his laboratory results for MGUS. Today he reports more fatigue over the past month as well as headaches and decreased appetite which has resulted in 16 pound weight loss since his last visit 6 months ago. May be related to mourning the passing death of 53 of his siblings but he does not think so. He only notes some trouble sleeping. He is having some visual changes which he attributes to needing new glasses. No recent head injury, neuro changes. He continues to have some mild SOB and cough which he attributes to his pulmonary nodules. He also mentions a lump of his left shoulder which has been present for the past few months to one year. Causes pain with left shoulder ROM and when sleeping on the arm at night. Pain described as sharp and 7/10. Tylenol helps.   ? ?He denies any recent fevers or illnesses. He denies any chest pain or hemoptysis.  He denies any nausea, vomiting, constipation, or diarrhea.  He has no urinary complaints.  ? ?REVIEW OF SYSTEMS:   ?Review of Systems  ?Constitutional:  Positive for malaise/fatigue and weight loss. Negative for fever.  ?Eyes:  Positive for blurred vision.  ?Respiratory:  Positive for cough. Negative for hemoptysis and shortness of breath.   ?Cardiovascular: Negative.  Negative for chest pain and leg swelling.  ?Gastrointestinal: Negative.  Negative for abdominal pain.  ?Genitourinary: Negative.  Negative for hematuria.  ?Musculoskeletal: Negative.  Negative for back pain and joint pain.  ?Skin: Negative.  Negative for rash.  ?Neurological:  Positive for headaches. Negative for focal weakness and weakness.   ?Psychiatric/Behavioral: Negative.  The patient is not nervous/anxious.   ? ?As per HPI. Otherwise, a complete review of systems is negative. ? ?PAST MEDICAL HISTORY: ?Past Medical History:  ?Diagnosis Date  ? Borderline diabetic   ? Cancer [facility])   ? GERD (gastroesophageal reflux disease)   ? ? ?PAST SURGICAL HISTORY: ?Past Surgical History:  ?Procedure Laterality Date  ? CERVICAL FUSION    ? ? ?FAMILY HISTORY: ?History reviewed. No pertinent family history. ? ?ADVANCED DIRECTIVES (Y/N):  N ? ?HEALTH MAINTENANCE: ?Social History  ? ?Tobacco Use  ? Smoking status: Former  ? Smokeless tobacco: Never  ?Vaping Use  ? Vaping Use: Never used  ?Substance Use Topics  ? Alcohol use: No  ? Drug use: Never  ? ? ? Colonoscopy: ? PAP: ? Bone density: ? Lipid panel: ? ?Allergies  ?Allergen Reactions  ? Penicillins Rash  ? ? ?Current Outpatient Medications  ?Medication Sig Dispense Refill  ? doxazosin (CARDURA) 8 MG tablet Take 1 tablet (8 mg total) by mouth daily. 90 tablet 3  ? ergocalciferol (VITAMIN D2) 1.25 MG (50000 UT) capsule Take 1 capsule (50,000 Units total) by mouth once a week. 12 capsule 1  ? finasteride (PROSCAR) 5 MG tablet Take 1 tablet (5 mg total) by mouth daily. 90 tablet 3  ? HYDROcodone-acetaminophen (NORCO/VICODIN) 5-325 MG tablet Take 1 tablet by mouth every 4 (four) hours as needed for moderate pain. 16 tablet 0  ? ibuprofen (ADVIL,MOTRIN) 600 MG tablet Take 1 tablet (600 mg total) by mouth every 6 (six) hours as needed for moderate pain. 30 tablet  0  ? Miconazole Nitrate (LOTRIMIN AF POWDER) 2 % AERP Apply 1 application topically 2 (two) times daily. 85 g 0  ? omeprazole (PRILOSEC) 20 MG capsule Take 20 mg by mouth daily.    ? pravastatin (PRAVACHOL) 40 MG tablet SMARTSIG:1 Tablet(s) By Mouth Every Evening    ? sildenafil (REVATIO) 20 MG tablet Take 3 to 5 tablets two hours before intercouse on an empty stomach.  Do not take with nitrates. 30 tablet 3  ? traMADol (ULTRAM) 50 MG tablet Take 1 tablet (50  mg total) by mouth every 6 (six) hours as needed. 15 tablet 0  ? furosemide (LASIX) 20 MG tablet Take 1 tablet (20 mg total) by mouth daily for 3 days. 3 tablet 0  ? ?No current facility-administered medications for this visit.  ? ? ?OBJECTIVE: ?Vitals:  ? 06/28/21 1056  ?BP: 104/61  ?Pulse: 76  ?Temp: (!) 95.8 ?F (35.4 ?C)  ?SpO2: 97%  ?   Body mass index is 24.25 kg/m?Marland Kitchen    ECOG FS:0 - Asymptomatic ? ?General: Well-developed, well-nourished, no acute distress. ?Eyes: Pink conjunctiva, anicteric sclera. ?HEENT: Normocephalic, moist mucous membranes. ?Lungs: No audible wheezing or coughing. ?Heart: Regular rate and rhythm. ?Abdomen: Soft, nontender, no obvious distention. ?Musculoskeletal: No edema, cyanosis, or clubbing. There is a 2 inch soft palpable mass of the anterior left shoulder without tenderness to palpation. No gross bony abnormality  ?Neuro: Alert, answering all questions appropriately. Cranial nerves grossly intact. Extremity strength 5/5 bilaterally ?Skin: No rashes or petechiae noted. ?Psych: Normal affect. ? ? ?LAB RESULTS: ? ?Lab Results  ?Component Value Date  ? NA 136 06/21/2021  ? K 3.9 06/21/2021  ? CL 103 06/21/2021  ? CO2 27 06/21/2021  ? GLUCOSE 93 06/21/2021  ? BUN 16 06/21/2021  ? CREATININE 0.77 06/21/2021  ? CALCIUM 9.1 06/21/2021  ? PROT 7.3 02/27/2020  ? ALBUMIN 3.7 02/27/2020  ? AST 22 02/27/2020  ? ALT 16 02/27/2020  ? ALKPHOS 70 02/27/2020  ? BILITOT 0.6 02/27/2020  ? GFRNONAA >60 06/21/2021  ? GFRAA >60 12/18/2019  ? ? ?Lab Results  ?Component Value Date  ? WBC 5.4 06/21/2021  ? NEUTROABS 3.3 06/21/2021  ? HGB 11.9 (L) 06/21/2021  ? HCT 35.9 (L) 06/21/2021  ? MCV 94.5 06/21/2021  ? PLT 251 06/21/2021  ? ?Lab Results  ?Component Value Date  ? TOTALPROTELP 7.2 06/21/2021  ? ALBUMINELP 3.5 06/21/2021  ? A1GS 0.3 06/21/2021  ? A2GS 0.8 06/21/2021  ? BETS 0.9 06/21/2021  ? GAMS 1.7 06/21/2021  ? MSPIKE 0.6 (H) 06/21/2021  ? SPEI Comment 06/21/2021  ? ? ? ?STUDIES: ?No results  found. ? ? ?ASSESSMENT: MGUS, pulmonary nodules. ? ?PLAN:   ? ?1. MGUS: Per Dr. Milinda Cave notes "Bone marrow biopsy completed on March 13, 2018 revealed a slightly hypercellular bone marrow with trilineage hematopoiesis.  Patient had 5% plasma cells.  Cytogenetic studies were normal.  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  He continues to have a mild IgM predominance and elevated kappa/lambda light chain ratio both of which are stable. No evidence of endorgan damage.  No intervention is needed at this time.  Return to clinic in 6 months with repeat laboratory work and further evaluation. ? ?2.  Pulmonary nodules: CT scan results from December 18, 2019 reviewed independently with the lesion in his right upper lobe essentially unchanged at 1.8 x 1.2 cm.  Lesion in his right lower lobe is also unchanged in size.  Previously, PET scan results from March 14, 2019 did not reveal any significant hypermetabolism. Given his cough, SOB and headaches along with past CT result he will need repeat imaging which I have ordered today. Should the nodules show growth of significance PET recommended.   ? ?3.  Unintentional weight loss: Wide differential. New onset headache and nodule change concerning so further imaging is needed at this time. May be secondary to mourning and we have discussed this today. He will follow up with his PCP and try to increase his oral intake of food. Will monitor.  ? ?4.  New Onset Headaches: New and concerning. CT brain with and without contract pending given his slowly growing pulmonary nodule, unintentional weight loss and new onset headaches. Follow up with Dr. Grayland Ormond within 7 days of imaging to discuss results ? ?5. Other Fatigue: New. Wide differential. CBC, BMP reassuring. Potentially due to mourning vs lower BP levels. Follow up with PCP within 2 weeks.  ?6. Mourning: Suspected  and new. Discussed. He will work closely with his PCP regarding this. Not interested in counseling at this time. No SI/HI.  ? ?7. Chronic left shoulder pain: New to me. Referral to orthopedics placed. Palpation of the

## 2021-07-09 ENCOUNTER — Ambulatory Visit
Admission: RE | Admit: 2021-07-09 | Discharge: 2021-07-09 | Disposition: A | Discharge status: Home / Self Care | Payer: Medicare Other | Source: Ambulatory Visit | Attending: Medical Oncology | Admitting: Medical Oncology

## 2021-07-09 DIAGNOSIS — R634 Abnormal weight loss: Secondary | ICD-10-CM | POA: Diagnosis present

## 2021-07-09 DIAGNOSIS — R918 Other nonspecific abnormal finding of lung field: Secondary | ICD-10-CM | POA: Insufficient documentation

## 2021-07-09 DIAGNOSIS — R519 Headache, unspecified: Secondary | ICD-10-CM | POA: Insufficient documentation

## 2021-07-09 MED ORDER — IOHEXOL 300 MG/ML  SOLN
100.0000 mL | Freq: Once | INTRAMUSCULAR | Status: AC | PRN
  Administered 2021-07-09: 100 mL via INTRAVENOUS

## 2021-07-12 NOTE — Progress Notes (Signed)
?[facility]  ?[phone]) [phone] [phone] ? ?ID: Frank Buckley OB: 1946/01/23  MR#: [id number]  NIO#:[id number] ? ?Patient Care Team: ?Marguerita Merles, MD as PCP - General (Family Medicine) ? ?CHIEF COMPLAINT: MGUS, pulmonary nodules. ? ?INTERVAL HISTORY: Patient returns to clinic today for further evaluation and discussion of his laboratory and imaging results.  He has a mild peripheral neuropathy, but otherwise feels well.  He has a fair appetite and admits to weight loss, but equates this to the recent death of several brothers and sisters.  He has no other neurologic complaints.  He denies any recent fevers or illnesses.  He denies any chest pain, shortness of breath, cough, or hemoptysis.  He denies any nausea, vomiting, constipation, or diarrhea.  He has no urinary complaints.  Patient offers no further specific complaints today. ? ?REVIEW OF SYSTEMS:   ?Review of Systems  ?Constitutional:  Positive for weight loss. Negative for fever and malaise/fatigue.  ?Respiratory: Negative.  Negative for cough, hemoptysis and shortness of breath.   ?Cardiovascular: Negative.  Negative for chest pain and leg swelling.  ?Gastrointestinal: Negative.  Negative for abdominal pain.  ?Genitourinary: Negative.  Negative for hematuria.  ?Musculoskeletal: Negative.  Negative for back pain and joint pain.  ?Skin: Negative.  Negative for rash.  ?Neurological:  Positive for sensory change. Negative for focal weakness, weakness and headaches.  ?Psychiatric/Behavioral: Negative.  The patient is not nervous/anxious.   ? ?As per HPI. Otherwise, a complete review of systems is negative. ? ?PAST MEDICAL HISTORY: ?Past Medical History:  ?Diagnosis Date  ? Borderline diabetic   ? Cancer [facility])   ? GERD (gastroesophageal reflux disease)   ? ? ?PAST SURGICAL HISTORY: ?Past Surgical History:  ?Procedure Laterality Date  ? CERVICAL FUSION    ? ? ?FAMILY HISTORY: ?History reviewed. No pertinent family  history. ? ?ADVANCED DIRECTIVES (Y/N):  N ? ?HEALTH MAINTENANCE: ?Social History  ? ?Tobacco Use  ? Smoking status: Former  ? Smokeless tobacco: Never  ?Vaping Use  ? Vaping Use: Never used  ?Substance Use Topics  ? Alcohol use: No  ? Drug use: Never  ? ? ? Colonoscopy: ? PAP: ? Bone density: ? Lipid panel: ? ?Allergies  ?Allergen Reactions  ? Penicillins Rash  ? ? ?Current Outpatient Medications  ?Medication Sig Dispense Refill  ? doxazosin (CARDURA) 8 MG tablet Take 1 tablet (8 mg total) by mouth daily. 90 tablet 3  ? ergocalciferol (VITAMIN D2) 1.25 MG (50000 UT) capsule Take 1 capsule (50,000 Units total) by mouth once a week. 12 capsule 1  ? finasteride (PROSCAR) 5 MG tablet Take 1 tablet (5 mg total) by mouth daily. 90 tablet 3  ? ibuprofen (ADVIL,MOTRIN) 600 MG tablet Take 1 tablet (600 mg total) by mouth every 6 (six) hours as needed for moderate pain. 30 tablet 0  ? Miconazole Nitrate (LOTRIMIN AF POWDER) 2 % AERP Apply 1 application topically 2 (two) times daily. 85 g 0  ? omeprazole (PRILOSEC) 20 MG capsule Take 20 mg by mouth daily.    ? pravastatin (PRAVACHOL) 40 MG tablet SMARTSIG:1 Tablet(s) By Mouth Every Evening    ? sildenafil (REVATIO) 20 MG tablet Take 3 to 5 tablets two hours before intercouse on an empty stomach.  Do not take with nitrates. 30 tablet 3  ? traMADol (ULTRAM) 50 MG tablet Take 1 tablet (50 mg total) by mouth every 6 (six) hours as needed. 15 tablet 0  ? furosemide (LASIX) 20 MG tablet Take 1  tablet (20 mg total) by mouth daily for 3 days. (Patient not taking: Reported on 07/13/2021) 3 tablet 0  ? HYDROcodone-acetaminophen (NORCO/VICODIN) 5-325 MG tablet Take 1 tablet by mouth every 4 (four) hours as needed for moderate pain. (Patient not taking: Reported on 07/13/2021) 16 tablet 0  ? ?No current facility-administered medications for this visit.  ? ? ?OBJECTIVE: ?Vitals:  ? 07/13/21 1107  ?BP: 115/75  ?Pulse: 65  ?Resp: 20  ?Temp: (!) 96.7 ?F (35.9 ?C)  ?   Body mass index is 22.18  kg/m?Marland Kitchen    ECOG FS:0 - Asymptomatic ? ?General: Well-developed, well-nourished, no acute distress. ?Eyes: Pink conjunctiva, anicteric sclera. ?HEENT: Normocephalic, moist mucous membranes. ?Lungs: No audible wheezing or coughing. ?Heart: Regular rate and rhythm. ?Abdomen: Soft, nontender, no obvious distention. ?Musculoskeletal: No edema, cyanosis, or clubbing. ?Neuro: Alert, answering all questions appropriately. Cranial nerves grossly intact. ?Skin: No rashes or petechiae noted. ?Psych: Normal affect. ? ?LAB RESULTS: ? ?Lab Results  ?Component Value Date  ? NA 136 06/21/2021  ? K 3.9 06/21/2021  ? CL 103 06/21/2021  ? CO2 27 06/21/2021  ? GLUCOSE 93 06/21/2021  ? BUN 16 06/21/2021  ? CREATININE 0.77 06/21/2021  ? CALCIUM 9.1 06/21/2021  ? PROT 7.3 02/27/2020  ? ALBUMIN 3.7 02/27/2020  ? AST 22 02/27/2020  ? ALT 16 02/27/2020  ? ALKPHOS 70 02/27/2020  ? BILITOT 0.6 02/27/2020  ? GFRNONAA >60 06/21/2021  ? GFRAA >60 12/18/2019  ? ? ?Lab Results  ?Component Value Date  ? WBC 5.4 06/21/2021  ? NEUTROABS 3.3 06/21/2021  ? HGB 11.9 (L) 06/21/2021  ? HCT 35.9 (L) 06/21/2021  ? MCV 94.5 06/21/2021  ? PLT 251 06/21/2021  ? ?Lab Results  ?Component Value Date  ? TOTALPROTELP 7.2 06/21/2021  ? ALBUMINELP 3.5 06/21/2021  ? A1GS 0.3 06/21/2021  ? A2GS 0.8 06/21/2021  ? BETS 0.9 06/21/2021  ? GAMS 1.7 06/21/2021  ? MSPIKE 0.6 (H) 06/21/2021  ? SPEI Comment 06/21/2021  ? ? ? ?STUDIES: ?CT HEAD W & WO CONTRAST (5MM) ? ?Result Date: 07/09/2021 ?CLINICAL DATA:  Unintentional weight loss. New onset of headaches after age 35. Headache, new or worsening. Weight loss, unintended; lung nodules, multiple. EXAM: CT HEAD WITHOUT AND WITH CONTRAST TECHNIQUE: Contiguous axial images were obtained from the base of the skull through the vertex without and with intravenous contrast. RADIATION DOSE REDUCTION: This exam was performed according to the departmental dose-optimization program which includes automated exposure control, adjustment of the  mA and/or kV according to patient size and/or use of iterative reconstruction technique. CONTRAST:  139m OMNIPAQUE IOHEXOL 300 MG/ML  SOLN COMPARISON:  No pertinent prior exams available for comparison. FINDINGS: Brain: Mild generalized cerebral atrophy, not unexpected for age. There is no acute intracranial hemorrhage. No demarcated cortical infarct. No extra-axial fluid collection. No evidence of an intracranial mass. No midline shift. No pathologic intracranial enhancement identified. Vascular: No hyperdense vessel on pre-contrast imaging. Atherosclerotic calcifications. Enhancement of the proximal large arterial vessels and dural venous sinus. Skull: Normal. Negative for fracture or focal lesion. Sinuses/Orbits: Small-volume fluid within the right frontoethmoidal recess. Moderate opacification of the bilateral ethmoid air cells due to the presence of mucosal thickening and fluid. Moderate-volume frothy secretions within the left sphenoid sinus. IMPRESSION: No evidence of acute intracranial abnormality. No evidence of intracranial metastatic disease. Paranasal sinus disease, as described. Electronically Signed   By: KKellie SimmeringD.O.   On: 07/09/2021 12:26  ? ?CT CHEST ABDOMEN PELVIS W CONTRAST ? ?Result  Date: 07/10/2021 ?CLINICAL DATA:  Unintended weight loss. EXAM: CT CHEST, ABDOMEN, AND PELVIS WITH CONTRAST TECHNIQUE: Multidetector CT imaging of the chest, abdomen and pelvis was performed following the standard protocol during bolus administration of intravenous contrast. RADIATION DOSE REDUCTION: This exam was performed according to the departmental dose-optimization program which includes automated exposure control, adjustment of the mA and/or kV according to patient size and/or use of iterative reconstruction technique. CONTRAST:  1106m OMNIPAQUE IOHEXOL 300 MG/ML  SOLN COMPARISON:  Chest CT dated 06/15/2020. CT abdomen and pelvis dated 11/12/2019. FINDINGS: CT CHEST FINDINGS Cardiovascular: Thoracic aorta  is normal in caliber and configuration. No pericardial effusion. Mediastinum/Nodes: No mass or enlarged lymph nodes are seen within the mediastinum or perihilar regions. Esophagus appears normal. Trachea and cent

## 2021-07-13 ENCOUNTER — Inpatient Hospital Stay: Payer: Medicare Other | Attending: Oncology | Admitting: Oncology

## 2021-07-13 ENCOUNTER — Encounter: Payer: Self-pay | Admitting: Oncology

## 2021-07-13 VITALS — BP 115/75 | HR 65 | Temp 96.7°F | Resp 20 | Wt 154.6 lb

## 2021-07-13 DIAGNOSIS — Z87891 Personal history of nicotine dependence: Secondary | ICD-10-CM | POA: Diagnosis not present

## 2021-07-13 DIAGNOSIS — R634 Abnormal weight loss: Secondary | ICD-10-CM | POA: Diagnosis not present

## 2021-07-13 DIAGNOSIS — R918 Other nonspecific abnormal finding of lung field: Secondary | ICD-10-CM | POA: Diagnosis not present

## 2021-07-13 DIAGNOSIS — G629 Polyneuropathy, unspecified: Secondary | ICD-10-CM | POA: Diagnosis not present

## 2021-07-13 DIAGNOSIS — D472 Monoclonal gammopathy: Secondary | ICD-10-CM | POA: Diagnosis present

## 2021-07-13 NOTE — Progress Notes (Signed)
Patient here for results. 

## 2021-07-20 ENCOUNTER — Ambulatory Visit: Payer: Medicare Other | Admitting: Medical Oncology

## 2022-01-11 ENCOUNTER — Other Ambulatory Visit: Payer: Self-pay

## 2022-01-11 DIAGNOSIS — D472 Monoclonal gammopathy: Secondary | ICD-10-CM

## 2022-01-12 ENCOUNTER — Inpatient Hospital Stay: Payer: Medicare Other | Attending: Nurse Practitioner

## 2022-01-12 ENCOUNTER — Ambulatory Visit: Admission: RE | Admit: 2022-01-12 | Payer: Medicare Other | Source: Ambulatory Visit

## 2022-01-14 ENCOUNTER — Encounter: Payer: Self-pay | Admitting: Nurse Practitioner

## 2022-01-14 ENCOUNTER — Ambulatory Visit: Payer: Medicare Other | Admitting: Medical Oncology

## 2022-01-14 ENCOUNTER — Inpatient Hospital Stay: Payer: Medicare Other | Admitting: Nurse Practitioner

## 2022-01-25 NOTE — Progress Notes (Unsigned)
01/26/2022 9:39 AM   Frank Buckley 03-Sep-1945 [id number]  Referring provider: Marguerita Merles, [address] [place],  [place] [address]  Urological history: 1. 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 BPH with LU TS -I PSS 15/6 -PVR 77 mL -doxazosin 8 mg daily and finasteride 5 mg daily  HPI: Frank Buckley is a 76 y.o. male who presents today for symptoms getting worse.    He is experiencing dysuria at this time.  It has been happening for several weeks.  He had been without his medications, doxazosin and finasteride) since March.  He has since restarted them three weeks ago.    Patient denies any modifying or aggravating factors.  Patient denies any gross hematuria or suprapubic/flank pain.  Patient denies any fevers, chills, nausea or vomiting.    He is sexually active and states he uses condom.    I PSS 15/6  UA yellow clear, specific gravity 1.015, pH 7.0, 0-5 WBCs, 0-2 RBCs, 0-10 epithelial cells, mucus threads are present and a few bacteria.  PVR 77 mL  IPSS     Row Name 01/26/22 0900         International Prostate Symptom Score   How often have you had the sensation of not emptying your bladder? Less than half the time     How often have you had to urinate less than every two hours? More than half the time     How often have you found you stopped and started again several times when you urinated? Less than 1 in 5 times     How often have you found it difficult to postpone urination? Almost always     How often have you had a weak urinary stream? Less than half the time     How often have you had to strain to start urination? Less than 1 in 5 times     How many times did you typically get up at night to urinate? None     Total IPSS Score 15       Quality of Life due to urinary  symptoms   If you were to spend the rest of your life with your urinary condition just the way it is now how would you feel about that? Terrible               Score:  1-7 Mild 8-19 Moderate 20-35 Severe    Patient is not having spontaneous erections.  He denies any pain or curvature with erections.  He is not taking the sildenafil.  He states he is having issues with both premature ejaculation as well as retrograde ejaculation.  SHIM 7   SHIM     Row Name 01/26/22 0912         SHIM: Over the last 6 months:   How do you rate your confidence that you could get and keep an erection? Very Low     When you had erections with sexual stimulation, how often were your erections hard enough for penetration (entering your partner)? Almost Never or Never     During sexual intercourse, how often were you able to maintain your erection after you had penetrated (entered) your partner? A Few Times (much less than half the time)     During sexual intercourse, how difficult was  it to maintain your erection to completion of intercourse? Extremely Difficult     When you attempted sexual intercourse, how often was it satisfactory for you? A Few Times (much less than half the time)       SHIM Total Score   SHIM 7              Score: 1-7 Severe ED 8-11 Moderate ED 12-16 Mild-Moderate ED 17-21 Mild ED 22-25 No ED   PMH: Past Medical History:  Diagnosis Date   Borderline diabetic    Cancer ([place])    GERD (gastroesophageal reflux disease)     Surgical History: Past Surgical History:  Procedure Laterality Date   CERVICAL FUSION      Home Medications:  Allergies as of 01/26/2022       Reactions   Penicillins Rash        Medication List        Accurate as of January 26, 2022  9:39 AM. If you have any questions, ask your nurse or doctor.          doxazosin 8 MG tablet Commonly known as: CARDURA Take 1 tablet (8 mg total) by mouth daily.   ergocalciferol 1.25 MG  (50000 UT) capsule Commonly known as: VITAMIN D2 Take 1 capsule (50,000 Units total) by mouth once a week.   finasteride 5 MG tablet Commonly known as: PROSCAR Take 1 tablet (5 mg total) by mouth daily.   furosemide 20 MG tablet Commonly known as: LASIX Take 1 tablet (20 mg total) by mouth daily for 3 days.   HYDROcodone-acetaminophen 5-325 MG tablet Commonly known as: NORCO/VICODIN Take 1 tablet by mouth every 4 (four) hours as needed for moderate pain.   ibuprofen 600 MG tablet Commonly known as: ADVIL Take 1 tablet (600 mg total) by mouth every 6 (six) hours as needed for moderate pain.   Lotrimin AF Powder 2 % Aerp Generic drug: Miconazole Nitrate Apply 1 application topically 2 (two) times daily.   omeprazole 20 MG capsule Commonly known as: PRILOSEC Take 20 mg by mouth daily.   pravastatin 40 MG tablet Commonly known as: PRAVACHOL SMARTSIG:1 Tablet(s) By Mouth Every Evening   sildenafil 20 MG tablet Commonly known as: REVATIO Take 3 to 5 tablets two hours before intercouse on an empty stomach.  Do not take with nitrates.   traMADol 50 MG tablet Commonly known as: ULTRAM Take 1 tablet (50 mg total) by mouth every 6 (six) hours as needed.        Allergies:  Allergies  Allergen Reactions   Penicillins Rash    Family History: No family history on file.  Social History:  reports that he has quit smoking. He has never used smokeless tobacco. He reports that he does not drink alcohol and does not use drugs.  ROS: For pertinent review of systems please refer to history of present illness  Physical Exam: BP 136/76   Pulse 64   Ht '5\' 10"'$  (1.778 m)   Wt 170 lb (77.1 kg)   BMI 24.39 kg/m   Constitutional:  Well nourished. Alert and oriented, No acute distress. HEENT: [place] AT, moist mucus membranes.  Trachea midline Cardiovascular: No clubbing, cyanosis, or edema. Respiratory: Normal respiratory effort, no increased work of breathing. GU: No CVA tenderness.   No bladder fullness or masses.  Patient with circumcised phallus.  Urethral meatus is patent.  No penile discharge. No penile lesions or rashes. Scrotum without lesions, cysts, rashes and/or edema.  Testicles are  located scrotally bilaterally. No masses are appreciated in the testicles. Left and right epididymis are normal. Rectal: Patient with  normal sphincter tone. Anus and perineum without scarring or rashes. No rectal masses are appreciated. Prostate is approximately 50 + grams, could only palpate the apex and midportion of the gland, no nodules are appreciated. Seminal vesicles could not be palpated Neurologic: Grossly intact, no focal deficits, moving all 4 extremities. Psychiatric: Normal mood and affect.   Laboratory Data: Urinalysis See Epic and HPI I have reviewed the labs.  Pertinent Imaging  01/26/22 09:13  Scan Result 47m    Assessment & Plan:   1. High risk hematuria -Hematuria work up completed in 12/2017 - findings positive for hypervascular prostate -No report of gross hematuria  -UA today negative     2. BPH with LU TS -most bothersome symptom - dysuria -Continue doxazosin 8 mg and finasteride 5 mg daily -He is not wanting to pursue cystoscopy/TRUS for possible work-up for bladder outlet procedure at this time he states he will continue with the medication  3. Dysuria -UA unremarkable -urine sent for culture/atypical's/GC and chlamydia  -Explained that if the cultures returned negative, we may need to pursue cystoscopy, but we will review this more at his return visit  4. ED -he did not fill the sildenafil prescription -Sildenafil 20 mg, 3 to 5 tablets two hours prior to intercourse on an empty stomach, # 30; he is warned not to take medications that contain nitrates.  I also advised him of the side effects, such as: headache, flushing, dyspepsia, abnormal vision, nasal congestion, back pain, myalgia, nausea, dizziness, and rash.   5. Ejaculatory  disorder -Patient and I discussed the mechanism of action of his doxazocin  and finasteride.  I explained to him that the doxazocin is an alpha-blocker which relaxes the smooth muscle encircling the prostate and bladder neck.  This results in an opening of the prostatic urethra and bladder neck facilitating emptying of the bladder.  The finasteride blocks the testosterone stimulation of the prostate therefore maintaining the size of the prostate or in some cases decreasing the size of the prostate.  It is true that some patients lower urinary tract symptoms can be managed with 1 or the other, but like blood pressure medications, some patients will need to address their lower urinary tract symptoms with the 2 different mechanisms of actions.  In relation to the ejaculatory disorder and the reduction of semen volume, I had a frank discussion with the patient that as a man ages and the prostate is enlarged is in size, it can be expected that the amount of semen and a forceful ejaculation can be reduced.  This can occur even without medication as the prostate will enlarge as a man ages.  He has the options to stop the doxazocin and finasteride and see if his sexual function and ejaculatory disorders improve, but his urinary symptoms may worsen.    6. Premature ejaculation -urine cultures pending and this may be the result of irritation by an infection -encouraged to try sildenafil   Return in about 1 month (around 02/26/2022) for IPSS, SHIM and PVR.  These notes generated with voice recognition software. I apologize for typographical errors.  [place] [phone]

## 2022-01-26 ENCOUNTER — Ambulatory Visit (INDEPENDENT_AMBULATORY_CARE_PROVIDER_SITE_OTHER): Payer: Medicare Other | Admitting: Urology

## 2022-01-26 ENCOUNTER — Encounter: Payer: Self-pay | Admitting: Urology

## 2022-01-26 VITALS — BP 136/76 | HR 64 | Ht 70.0 in | Wt 170.0 lb

## 2022-01-26 DIAGNOSIS — R319 Hematuria, unspecified: Secondary | ICD-10-CM

## 2022-01-26 DIAGNOSIS — R300 Dysuria: Secondary | ICD-10-CM

## 2022-01-26 DIAGNOSIS — N401 Enlarged prostate with lower urinary tract symptoms: Secondary | ICD-10-CM

## 2022-01-26 DIAGNOSIS — N5314 Retrograde ejaculation: Secondary | ICD-10-CM

## 2022-01-26 DIAGNOSIS — N5203 Combined arterial insufficiency and corporo-venous occlusive erectile dysfunction: Secondary | ICD-10-CM | POA: Diagnosis not present

## 2022-01-26 DIAGNOSIS — N529 Male erectile dysfunction, unspecified: Secondary | ICD-10-CM

## 2022-01-26 DIAGNOSIS — N138 Other obstructive and reflux uropathy: Secondary | ICD-10-CM

## 2022-01-26 DIAGNOSIS — F524 Premature ejaculation: Secondary | ICD-10-CM

## 2022-01-26 LAB — URINALYSIS, COMPLETE
Bilirubin, UA: NEGATIVE
Glucose, UA: NEGATIVE
Ketones, UA: NEGATIVE
Leukocytes,UA: NEGATIVE
Nitrite, UA: NEGATIVE
Protein,UA: NEGATIVE
RBC, UA: NEGATIVE
Specific Gravity, UA: 1.015 (ref 1.005–1.030)
Urobilinogen, Ur: 0.2 mg/dL (ref 0.2–1.0)
pH, UA: 7 (ref 5.0–7.5)

## 2022-01-26 LAB — BLADDER SCAN AMB NON-IMAGING

## 2022-01-26 LAB — MICROSCOPIC EXAMINATION

## 2022-01-26 MED ORDER — SILDENAFIL CITRATE 20 MG PO TABS: ORAL_TABLET | ORAL | 3 refills | Status: DC

## 2022-01-28 LAB — CULTURE, URINE COMPREHENSIVE

## 2022-01-29 LAB — GC/CHLAMYDIA PROBE AMP
Chlamydia trachomatis, NAA: NEGATIVE
Neisseria Gonorrhoeae by PCR: NEGATIVE

## 2022-01-31 ENCOUNTER — Telehealth: Payer: Self-pay

## 2022-01-31 NOTE — Telephone Encounter (Signed)
Attempted to reach pt, both numbers listed in chart are disconnected.

## 2022-01-31 NOTE — Telephone Encounter (Signed)
-----   Message from Nori Riis, PA-C sent at 01/31/2022  8:18 AM EDT ----- Please let Mr. Huezo know that his GC/chlamydia cultures and urine cultures returned negative.  We are still waiting on the atypical results.

## 2022-02-01 LAB — MYCOPLASMA / UREAPLASMA CULTURE
Mycoplasma hominis Culture: NEGATIVE
Ureaplasma urealyticum: NEGATIVE

## 2022-02-02 ENCOUNTER — Telehealth: Payer: Self-pay

## 2022-02-02 NOTE — Telephone Encounter (Signed)
Attempted to reach pt for results, number is not in service.

## 2022-02-02 NOTE — Telephone Encounter (Signed)
-----   Message from Nori Riis, PA-C sent at 02/02/2022  8:18 AM EDT ----- Please let Frank Buckley know that his atypical cultures are negative.

## 2022-02-18 ENCOUNTER — Inpatient Hospital Stay: Payer: Medicare Other | Attending: Nurse Practitioner

## 2022-02-18 ENCOUNTER — Inpatient Hospital Stay: Payer: Medicare Other | Admitting: Oncology

## 2022-02-28 NOTE — Progress Notes (Signed)
03/01/2022 12:49 PM   Frank Buckley 16-Nov-1945 [id number]  Referring provider: Leanna Sato, MD [address] [place],  [place] [address]  Urological history: 1. 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 BPH with LU TS -prostate volume off 07/2021 CT ~ 40 cc -I PSS 29/6 -doxazosin 8 mg daily and finasteride 5 mg daily  HPI: Frank Buckley is a 76 y.o. male who presents today for one month follow up.    At his visit on 01/26/2022, he was experiencing dysuria for several weeks.  He had been without his medications, (doxazosin and finasteride) since March.  He is sexually active and stated he uses condom.  I PSS 15/6.  UA yellow clear, specific gravity 1.015, pH 7.0, 0-5 WBCs, 0-2 RBCs, 0-10 epithelial cells, mucus threads are present and a few bacteria.  PVR 77 mL.  Patient was not having spontaneous erections.  He denied any pain or curvature with erections.  He was not taking the sildenafil.  He stated he is having issues with both premature ejaculation as well as retrograde ejaculation.  SHIM 7.  Urine culture, GC/chlamydia and atypical cultures were negative.  He was encouraged to restart the doxazosin and finasteride and to take the sildenafil.    He continues to experience dysuria, feelings of incomplete bladder emptying and spraying of the urinary stream despite taking doxazosin and finasteride.  Patient denies any modifying or aggravating factors.  Patient denies any gross hematuria, or suprapubic/flank pain.  Patient denies any fevers, chills, nausea or vomiting.     IPSS     Row Name 03/01/22 1100         International Prostate Symptom Score   How often have you had the sensation of not emptying your bladder? Almost always     How  often have you had to urinate less than every two hours? Almost always     How often have you found you stopped and started again several times when you urinated? Almost always     How often have you found it difficult to postpone urination? Almost always     How often have you had a weak urinary stream? Almost always     How often have you had to strain to start urination? More than half the time     How many times did you typically get up at night to urinate? None     Total IPSS Score 29       Quality of Life due to urinary symptoms   If you were to spend the rest of your life with your urinary condition just the way it is now how would you feel about that? Terrible              Score:  1-7 Mild 8-19 Moderate 20-35 Severe   He was not able to fill the sildenafil prescription secondary to cost.     SHIM     Row Name 03/01/22 1138         SHIM: Over the last 6 months:   How do you rate your confidence that you could get and keep an erection? Low     When you had erections with sexual stimulation, how often were your erections hard enough for penetration (entering your partner)?  Almost Never or Never     During sexual intercourse, how often were you able to maintain your erection after you had penetrated (entered) your partner? Almost Always or Always     During sexual intercourse, how difficult was it to maintain your erection to completion of intercourse? Slightly Difficult     When you attempted sexual intercourse, how often was it satisfactory for you? Almost Always or Always       SHIM Total Score   SHIM 17              Score: 1-7 Severe ED 8-11 Moderate ED 12-16 Mild-Moderate ED 17-21 Mild ED 22-25 No ED     PMH: Past Medical History:  Diagnosis Date   Borderline diabetic    Cancer ([facility])    GERD (gastroesophageal reflux disease)     Surgical History: Past Surgical History:  Procedure Laterality Date   CERVICAL FUSION      Home Medications:   Allergies as of 03/01/2022       Reactions   Penicillins Rash        Medication List        Accurate as of March 01, 2022 12:49 PM. If you have any questions, ask your nurse or doctor.          doxazosin 8 MG tablet Commonly known as: CARDURA Take 1 tablet (8 mg total) by mouth daily.   ergocalciferol 1.25 MG (50000 UT) capsule Commonly known as: VITAMIN D2 Take 1 capsule (50,000 Units total) by mouth once a week.   finasteride 5 MG tablet Commonly known as: PROSCAR Take 1 tablet (5 mg total) by mouth daily.   furosemide 20 MG tablet Commonly known as: LASIX Take 1 tablet (20 mg total) by mouth daily for 3 days.   HYDROcodone-acetaminophen 5-325 MG tablet Commonly known as: NORCO/VICODIN Take 1 tablet by mouth every 4 (four) hours as needed for moderate pain.   ibuprofen 600 MG tablet Commonly known as: ADVIL Take 1 tablet (600 mg total) by mouth every 6 (six) hours as needed for moderate pain.   Lotrimin AF Powder 2 % Aerp Generic drug: Miconazole Nitrate Apply 1 application topically 2 (two) times daily.   omeprazole 20 MG capsule Commonly known as: PRILOSEC Take 20 mg by mouth daily.   pravastatin 40 MG tablet Commonly known as: PRAVACHOL SMARTSIG:1 Tablet(s) By Mouth Every Evening   sildenafil 20 MG tablet Commonly known as: REVATIO Take 3 to 5 tablets two hours before intercouse on an empty stomach.  Do not take with nitrates.   traMADol 50 MG tablet Commonly known as: ULTRAM Take 1 tablet (50 mg total) by mouth every 6 (six) hours as needed.        Allergies:  Allergies  Allergen Reactions   Penicillins Rash    Family History: No family history on file.  Social History:  reports that he has quit smoking. He has never used smokeless tobacco. He reports that he does not drink alcohol and does not use drugs.  ROS: For pertinent review of systems please refer to history of present illness  Physical Exam: BP 132/82   Pulse 81    Ht 5\' 10"  (1.778 m)   Wt 170 lb (77.1 kg)   BMI 24.39 kg/m   Constitutional:  Well nourished. Alert and oriented, No acute distress. HEENT: [place] AT, moist mucus membranes.  Trachea midline Cardiovascular: No clubbing, cyanosis, or edema. Respiratory: Normal respiratory effort, no increased work of breathing. Neurologic: Grossly  intact, no focal deficits, moving all 4 extremities. Psychiatric: Normal mood and affect.   Laboratory Data: N/A  Pertinent Imaging N/A  Assessment & Plan:   1. High risk hematuria -Hematuria work up completed in 12/2017 - findings positive for hypervascular prostate -No report of gross hematuria     2. BPH with LU TS -no improvement of symptoms on Cardura or Proscar -Explained that he has failed maximal medical therapy that we need to proceed onto cystoscopy for further evaluation -He is in agreement at this time -I have explained to the patient that they will  be scheduled for a cystoscopy in our office to evaluate their bladder.  The cystoscopy consists of passing a tube with a lens up through their urethra and into their urinary bladder.   We will inject the urethra with a lidocaine gel prior to introducing the cystoscope to help with any discomfort during the procedure.   After the procedure, they might experience blood in the urine and discomfort with urination.  This will abate after the first few voids.  I have  encouraged the patient to increase water intake  during this time.  Patient denies any allergies to lidocaine.    3. Dysuria -cultures negative -cysto pending  4. ED -was given GoodRx coupon  5. Ejaculatory disorder -see #4    Return for cysto for dysuria/BOO .  These notes generated with voice recognition software. I apologize for typographical errors.  Cloretta Ned  [facility] [address]  [place], [place] [address] [phone]

## 2022-03-01 ENCOUNTER — Ambulatory Visit (INDEPENDENT_AMBULATORY_CARE_PROVIDER_SITE_OTHER): Payer: Medicare Other | Admitting: Urology

## 2022-03-01 ENCOUNTER — Encounter: Payer: Self-pay | Admitting: Urology

## 2022-03-01 VITALS — BP 132/82 | HR 81 | Ht 70.0 in | Wt 170.0 lb

## 2022-03-01 DIAGNOSIS — N5314 Retrograde ejaculation: Secondary | ICD-10-CM | POA: Diagnosis not present

## 2022-03-01 DIAGNOSIS — N5203 Combined arterial insufficiency and corporo-venous occlusive erectile dysfunction: Secondary | ICD-10-CM | POA: Diagnosis not present

## 2022-03-01 DIAGNOSIS — N529 Male erectile dysfunction, unspecified: Secondary | ICD-10-CM | POA: Diagnosis not present

## 2022-03-01 DIAGNOSIS — N138 Other obstructive and reflux uropathy: Secondary | ICD-10-CM

## 2022-03-01 DIAGNOSIS — N401 Enlarged prostate with lower urinary tract symptoms: Secondary | ICD-10-CM

## 2022-03-01 DIAGNOSIS — R319 Hematuria, unspecified: Secondary | ICD-10-CM

## 2022-03-01 MED ORDER — SILDENAFIL CITRATE 20 MG PO TABS: ORAL_TABLET | ORAL | 3 refills | Status: DC

## 2022-03-07 ENCOUNTER — Ambulatory Visit
Admission: RE | Admit: 2022-03-07 | Discharge: 2022-03-07 | Disposition: A | Discharge status: Home / Self Care | Payer: Medicare Other | Source: Ambulatory Visit | Attending: Oncology | Admitting: Oncology

## 2022-03-07 DIAGNOSIS — R918 Other nonspecific abnormal finding of lung field: Secondary | ICD-10-CM | POA: Insufficient documentation

## 2022-03-07 LAB — POCT I-STAT CREATININE: Creatinine, Ser: 0.7 mg/dL (ref 0.61–1.24)

## 2022-03-07 MED ORDER — IOHEXOL 300 MG/ML  SOLN
75.0000 mL | Freq: Once | INTRAMUSCULAR | Status: AC | PRN
  Administered 2022-03-07: 75 mL via INTRAVENOUS

## 2022-03-17 ENCOUNTER — Ambulatory Visit (INDEPENDENT_AMBULATORY_CARE_PROVIDER_SITE_OTHER): Payer: Medicare Other | Admitting: Urology

## 2022-03-17 ENCOUNTER — Encounter: Payer: Self-pay | Admitting: Urology

## 2022-03-17 VITALS — BP 127/71 | HR 63 | Ht 70.0 in | Wt 164.0 lb

## 2022-03-17 DIAGNOSIS — R350 Frequency of micturition: Secondary | ICD-10-CM | POA: Diagnosis not present

## 2022-03-17 DIAGNOSIS — R399 Unspecified symptoms and signs involving the genitourinary system: Secondary | ICD-10-CM

## 2022-03-17 DIAGNOSIS — R3915 Urgency of urination: Secondary | ICD-10-CM

## 2022-03-17 DIAGNOSIS — R300 Dysuria: Secondary | ICD-10-CM

## 2022-03-17 LAB — URINALYSIS, COMPLETE
Bilirubin, UA: NEGATIVE
Glucose, UA: NEGATIVE
Ketones, UA: NEGATIVE
Leukocytes,UA: NEGATIVE
Nitrite, UA: NEGATIVE
Specific Gravity, UA: 1.025 (ref 1.005–1.030)
Urobilinogen, Ur: 1 mg/dL (ref 0.2–1.0)
pH, UA: 6.5 (ref 5.0–7.5)

## 2022-03-17 LAB — MICROSCOPIC EXAMINATION

## 2022-03-17 MED ORDER — MIRABEGRON ER 25 MG PO TB24: 25.0000 mg | ORAL_TABLET | Freq: Every day | ORAL | 0 refills | Status: DC

## 2022-03-17 NOTE — Progress Notes (Signed)
   03/17/22  CC:  Chief Complaint  Patient presents with   Cysto    HPI: Refer to Larene Beach McGowan's note 03/01/2022 today he states his primary complaints are dysuria, frequency and urgency.  On doxazosin and finasteride  Blood pressure 127/71, pulse 63, height '5\' 10"'$  (1.778 m), weight 164 lb (74.4 kg). NED. A&Ox3.   No respiratory distress   Abd soft, NT, ND Normal phallus with bilateral descended testicles  Cystoscopy Procedure Note  Patient identification was confirmed, informed consent was obtained, and patient was prepped using Betadine solution.  Lidocaine jelly was administered per urethral meatus.     Pre-Procedure: - Inspection reveals a normal caliber urethral meatus.  Procedure: The flexible cystoscope was introduced without difficulty - No urethral strictures/lesions are present. -Mild lateral lobe enlargement with short prostatic urethra - Normal bladder neck - Bilateral ureteral orifices identified - Bladder mucosa  reveals no ulcers, tumors, or lesions - No bladder stones - No trabeculation  Retroflexion shows no mucosal abnormalities or intravesical median lobe   Post-Procedure: - Patient tolerated the procedure well  Assessment/ Plan: No evidence of urethral stricture or significant BPH Prostate volume calculated on CT 2021 was 21 cc Given Myrbetriq 25 mg samples to see if this helps his voiding pattern Follow-up visit with Larene Beach in 1 month for symptom recheck and PVR    Abbie Sons, MD

## 2022-04-15 ENCOUNTER — Ambulatory Visit: Payer: Medicare Other | Admitting: Urology

## 2022-04-15 ENCOUNTER — Encounter: Payer: Self-pay | Admitting: Urology

## 2022-05-26 ENCOUNTER — Ambulatory Visit: Payer: Medicare Other | Admitting: Urology

## 2022-06-20 ENCOUNTER — Encounter: Payer: Self-pay | Admitting: Oncology

## 2022-06-20 ENCOUNTER — Emergency Department
Admission: EM | Admit: 2022-06-20 | Discharge: 2022-06-20 | Disposition: A | Discharge status: Home / Self Care | Payer: Medicare Other | Attending: Emergency Medicine | Admitting: Emergency Medicine

## 2022-06-20 ENCOUNTER — Other Ambulatory Visit: Payer: Self-pay

## 2022-06-20 DIAGNOSIS — R5383 Other fatigue: Secondary | ICD-10-CM | POA: Diagnosis present

## 2022-06-20 DIAGNOSIS — Z20822 Contact with and (suspected) exposure to covid-19: Secondary | ICD-10-CM | POA: Insufficient documentation

## 2022-06-20 DIAGNOSIS — Z0001 Encounter for general adult medical examination with abnormal findings: Secondary | ICD-10-CM | POA: Insufficient documentation

## 2022-06-20 DIAGNOSIS — Z0000 Encounter for general adult medical examination without abnormal findings: Secondary | ICD-10-CM

## 2022-06-20 LAB — COMPREHENSIVE METABOLIC PANEL
ALT: 18 U/L (ref 0–44)
AST: 32 U/L (ref 15–41)
Albumin: 3.9 g/dL (ref 3.5–5.0)
Alkaline Phosphatase: 76 U/L (ref 38–126)
Anion gap: 10 (ref 5–15)
BUN: 15 mg/dL (ref 8–23)
CO2: 26 mmol/L (ref 22–32)
Calcium: 9.4 mg/dL (ref 8.9–10.3)
Chloride: 102 mmol/L (ref 98–111)
Creatinine, Ser: 0.84 mg/dL (ref 0.61–1.24)
GFR, Estimated: >60 mL/min (ref >60)
Glucose, Bld: 193 mg/dL — ABNORMAL HIGH (ref 70–99)
Potassium: 3.8 mmol/L (ref 3.5–5.1)
Sodium: 138 mmol/L (ref 135–145)
Total Bilirubin: 0.9 mg/dL (ref 0.3–1.2)
Total Protein: 7.8 g/dL (ref 6.5–8.1)

## 2022-06-20 LAB — CBC WITH DIFFERENTIAL/PLATELET
Abs Immature Granulocytes: 0 10*3/uL (ref 0.00–0.07)
Basophils Absolute: 0 10*3/uL (ref 0.0–0.1)
Basophils Relative: 1 %
Eosinophils Absolute: 0.2 10*3/uL (ref 0.0–0.5)
Eosinophils Relative: 4 %
HCT: 37.3 % — ABNORMAL LOW (ref 39.0–52.0)
Hemoglobin: 12.2 g/dL — ABNORMAL LOW (ref 13.0–17.0)
Immature Granulocytes: 0 %
Lymphocytes Relative: 29 %
Lymphs Abs: 1.6 10*3/uL (ref 0.7–4.0)
MCH: 30.6 pg (ref 26.0–34.0)
MCHC: 32.7 g/dL (ref 30.0–36.0)
MCV: 93.5 fL (ref 80.0–100.0)
Monocytes Absolute: 0.4 10*3/uL (ref 0.1–1.0)
Monocytes Relative: 7 %
Neutro Abs: 3.2 10*3/uL (ref 1.7–7.7)
Neutrophils Relative %: 59 %
Platelets: 246 10*3/uL (ref 150–400)
RBC: 3.99 MIL/uL — ABNORMAL LOW (ref 4.22–5.81)
RDW: 12.4 % (ref 11.5–15.5)
WBC: 5.4 10*3/uL (ref 4.0–10.5)
nRBC: 0 % (ref 0.0–0.2)

## 2022-06-20 LAB — RESP PANEL BY RT-PCR (RSV, FLU A&B, COVID)  RVPGX2
Influenza A by PCR: NEGATIVE
Influenza B by PCR: NEGATIVE
Resp Syncytial Virus by PCR: NEGATIVE
SARS Coronavirus 2 by RT PCR: NEGATIVE

## 2022-06-20 NOTE — ED Provider Notes (Signed)
   [facility] Provider Note    Event Date/Time   First MD Initiated Contact with Patient 06/20/22 2018     (approximate)  History   Chief Complaint: Covid Exposure  HPI  Frank Buckley is a 77 y.o. male with a past medical history of gastric reflux, borderline diabetic presents to the emergency department for possible COVID exposure.  According to the patient approximately 5 or 6 days ago he was at the bank try to get a loan, states someone was coughing he later found out that they had COVID.  Patient states he has been feeling a little bit more fatigued and wanted to make sure that he did not have COVID.  Denies any fever, no cough, but states some sinus congestion.  Physical Exam   Triage Vital Signs: ED Triage Vitals [06/20/22 1748]  Enc Vitals Group     BP (!) 140/80     Pulse Rate (!) 104     Resp 18     Temp 98 F (36.7 C)     Temp Source Oral     SpO2 98 %     Weight      Height      Head Circumference      Peak Flow      Pain Score      Pain Loc      Pain Edu?      Excl. in [place]?     Most recent vital signs: Vitals:   06/20/22 1748  BP: (!) 140/80  Pulse: (!) 104  Resp: 18  Temp: 98 F (36.7 C)  SpO2: 98%    General: Awake, no distress.  CV:  Good peripheral perfusion.  Regular rate and rhythm  Resp:  Normal effort.  Equal breath sounds bilaterally.  Abd:  No distention.  Soft, nontender.  No rebound or guarding. Other:  Patient has a small cyst to the left shoulder somewhat mobile has been present for some time per patient.   ED Results / Procedures / Treatments   MEDICATIONS ORDERED IN ED: Medications - No data to display   IMPRESSION / MDM / [place] / ED COURSE  I reviewed the triage vital signs and the nursing notes.  Patient's presentation is most consistent with acute presentation with potential threat to life or bodily function.  Patient presents emergency department for possible COVID exposure less  than a week ago now with some mild congestion per patient.  Overall the patient appears well, no distress.  Reassuring physical exam, reassuring vital signs, normal CBC with a reassuring chemistry.  We will obtain a COVID flu and RSV swab as well as basic labs.  Has a secondary complaint patient states he has been experiencing some increased discomfort to an area of his left shoulder where the patient appears to have a cyst.  No erythema no signs of infection.  Discussed with the patient follow-up with orthopedics he states has been there for quite some time.  COVID/flu/RSV is negative.  Patient is very reassured.  States he is ready to go home he is hungry.  Will discharge.  FINAL CLINICAL IMPRESSION(S) / ED DIAGNOSES   General medical evaluation   Note:  This document was prepared using Dragon voice recognition software and may include unintentional dictation errors.   Harvest Dark, MD 06/20/22 2214

## 2022-06-20 NOTE — ED Triage Notes (Signed)
Pt states he has had cold-like symptoms. (Chills, sinus symptoms) Pt states being around people who have had covid. Pt is A&Ox4. Pt is on room air. Pt states he has had all his covid vaccinations.

## 2022-07-10 ENCOUNTER — Inpatient Hospital Stay: Payer: Medicare Other

## 2022-07-10 ENCOUNTER — Emergency Department: Payer: Medicare Other

## 2022-07-10 ENCOUNTER — Inpatient Hospital Stay
Admission: EM | Admit: 2022-07-10 | Discharge: 2022-07-14 | DRG: 557 | Disposition: A | Discharge status: Skilled Nursing Facility | Payer: Medicare Other | Attending: Internal Medicine | Admitting: Internal Medicine

## 2022-07-10 ENCOUNTER — Other Ambulatory Visit: Payer: Self-pay

## 2022-07-10 DIAGNOSIS — R531 Weakness: Secondary | ICD-10-CM | POA: Diagnosis not present

## 2022-07-10 DIAGNOSIS — Z880 Allergy status to penicillin: Secondary | ICD-10-CM | POA: Diagnosis not present

## 2022-07-10 DIAGNOSIS — D509 Iron deficiency anemia, unspecified: Secondary | ICD-10-CM | POA: Diagnosis present

## 2022-07-10 DIAGNOSIS — E785 Hyperlipidemia, unspecified: Secondary | ICD-10-CM | POA: Diagnosis present

## 2022-07-10 DIAGNOSIS — G959 Disease of spinal cord, unspecified: Secondary | ICD-10-CM | POA: Diagnosis present

## 2022-07-10 DIAGNOSIS — Z981 Arthrodesis status: Secondary | ICD-10-CM

## 2022-07-10 DIAGNOSIS — E162 Hypoglycemia, unspecified: Secondary | ICD-10-CM | POA: Diagnosis present

## 2022-07-10 DIAGNOSIS — M79672 Pain in left foot: Secondary | ICD-10-CM | POA: Diagnosis present

## 2022-07-10 DIAGNOSIS — E872 Acidosis, unspecified: Secondary | ICD-10-CM | POA: Diagnosis present

## 2022-07-10 DIAGNOSIS — M6282 Rhabdomyolysis: Secondary | ICD-10-CM | POA: Diagnosis present

## 2022-07-10 DIAGNOSIS — R748 Abnormal levels of other serum enzymes: Secondary | ICD-10-CM | POA: Diagnosis present

## 2022-07-10 DIAGNOSIS — F0392 Unspecified dementia, unspecified severity, with psychotic disturbance: Secondary | ICD-10-CM | POA: Diagnosis present

## 2022-07-10 DIAGNOSIS — Z1152 Encounter for screening for COVID-19: Secondary | ICD-10-CM | POA: Diagnosis not present

## 2022-07-10 DIAGNOSIS — R410 Disorientation, unspecified: Secondary | ICD-10-CM | POA: Diagnosis not present

## 2022-07-10 DIAGNOSIS — K219 Gastro-esophageal reflux disease without esophagitis: Secondary | ICD-10-CM | POA: Diagnosis present

## 2022-07-10 DIAGNOSIS — Z79899 Other long term (current) drug therapy: Secondary | ICD-10-CM | POA: Diagnosis not present

## 2022-07-10 DIAGNOSIS — R911 Solitary pulmonary nodule: Secondary | ICD-10-CM | POA: Diagnosis present

## 2022-07-10 DIAGNOSIS — D472 Monoclonal gammopathy: Secondary | ICD-10-CM | POA: Diagnosis present

## 2022-07-10 DIAGNOSIS — E876 Hypokalemia: Secondary | ICD-10-CM | POA: Diagnosis not present

## 2022-07-10 DIAGNOSIS — R4182 Altered mental status, unspecified: Secondary | ICD-10-CM | POA: Diagnosis not present

## 2022-07-10 DIAGNOSIS — K760 Fatty (change of) liver, not elsewhere classified: Secondary | ICD-10-CM | POA: Diagnosis present

## 2022-07-10 DIAGNOSIS — G9341 Metabolic encephalopathy: Secondary | ICD-10-CM | POA: Diagnosis present

## 2022-07-10 DIAGNOSIS — M79671 Pain in right foot: Secondary | ICD-10-CM

## 2022-07-10 DIAGNOSIS — Z87891 Personal history of nicotine dependence: Secondary | ICD-10-CM | POA: Diagnosis not present

## 2022-07-10 DIAGNOSIS — R6510 Systemic inflammatory response syndrome (SIRS) of non-infectious origin without acute organ dysfunction: Secondary | ICD-10-CM | POA: Diagnosis not present

## 2022-07-10 DIAGNOSIS — N400 Enlarged prostate without lower urinary tract symptoms: Secondary | ICD-10-CM | POA: Diagnosis present

## 2022-07-10 DIAGNOSIS — M4802 Spinal stenosis, cervical region: Secondary | ICD-10-CM | POA: Diagnosis not present

## 2022-07-10 DIAGNOSIS — G9340 Encephalopathy, unspecified: Principal | ICD-10-CM

## 2022-07-10 HISTORY — DX: Metabolic encephalopathy: G93.41

## 2022-07-10 HISTORY — DX: Abnormal levels of other serum enzymes: R74.8

## 2022-07-10 LAB — URINALYSIS, ROUTINE W REFLEX MICROSCOPIC
Bacteria, UA: NONE SEEN
Bilirubin Urine: NEGATIVE
Glucose, UA: NEGATIVE mg/dL
Ketones, ur: 80 mg/dL — AB
Leukocytes,Ua: NEGATIVE
Nitrite: NEGATIVE
Protein, ur: 30 mg/dL — AB
Specific Gravity, Urine: 1.026 (ref 1.005–1.030)
pH: 5 (ref 5.0–8.0)

## 2022-07-10 LAB — BASIC METABOLIC PANEL
Anion gap: 7 (ref 5–15)
BUN: 18 mg/dL (ref 8–23)
CO2: 27 mmol/L (ref 22–32)
Calcium: 9.3 mg/dL (ref 8.9–10.3)
Chloride: 104 mmol/L (ref 98–111)
Creatinine, Ser: 0.67 mg/dL (ref 0.61–1.24)
GFR, Estimated: >60 mL/min (ref >60)
Glucose, Bld: 97 mg/dL (ref 70–99)
Potassium: 3.7 mmol/L (ref 3.5–5.1)
Sodium: 138 mmol/L (ref 135–145)

## 2022-07-10 LAB — URINE DRUG SCREEN, QUALITATIVE (ARMC ONLY)
Amphetamines, Ur Screen: NOT DETECTED
Barbiturates, Ur Screen: NOT DETECTED
Benzodiazepine, Ur Scrn: NOT DETECTED
Cannabinoid 50 Ng, Ur ~~LOC~~: NOT DETECTED
Cocaine Metabolite,Ur ~~LOC~~: NOT DETECTED
MDMA (Ecstasy)Ur Screen: NOT DETECTED
Methadone Scn, Ur: NOT DETECTED
Opiate, Ur Screen: NOT DETECTED
Phencyclidine (PCP) Ur S: NOT DETECTED
Tricyclic, Ur Screen: NOT DETECTED

## 2022-07-10 LAB — CBC
HCT: 38.6 % — ABNORMAL LOW (ref 39.0–52.0)
Hemoglobin: 12.9 g/dL — ABNORMAL LOW (ref 13.0–17.0)
MCH: 31 pg (ref 26.0–34.0)
MCHC: 33.4 g/dL (ref 30.0–36.0)
MCV: 92.8 fL (ref 80.0–100.0)
Platelets: 281 10*3/uL (ref 150–400)
RBC: 4.16 MIL/uL — ABNORMAL LOW (ref 4.22–5.81)
RDW: 12.5 % (ref 11.5–15.5)
WBC: 12 10*3/uL — ABNORMAL HIGH (ref 4.0–10.5)
nRBC: 0 % (ref 0.0–0.2)

## 2022-07-10 LAB — HEPATIC FUNCTION PANEL
ALT: 19 U/L (ref 0–44)
AST: 52 U/L — ABNORMAL HIGH (ref 15–41)
Albumin: 2.8 g/dL — ABNORMAL LOW (ref 3.5–5.0)
Alkaline Phosphatase: 58 U/L (ref 38–126)
Bilirubin, Direct: 0.3 mg/dL — ABNORMAL HIGH (ref 0.0–0.2)
Indirect Bilirubin: 1.3 mg/dL — ABNORMAL HIGH (ref 0.3–0.9)
Total Bilirubin: 1.6 mg/dL — ABNORMAL HIGH (ref 0.3–1.2)
Total Protein: 5.5 g/dL — ABNORMAL LOW (ref 6.5–8.1)

## 2022-07-10 LAB — CK: Total CK: 1615 U/L — ABNORMAL HIGH (ref 49–397)

## 2022-07-10 LAB — CBG MONITORING, ED: Glucose-Capillary: 102 mg/dL — ABNORMAL HIGH (ref 70–99)

## 2022-07-10 LAB — SARS CORONAVIRUS 2 BY RT PCR: SARS Coronavirus 2 by RT PCR: NEGATIVE

## 2022-07-10 LAB — LACTIC ACID, PLASMA
Lactic Acid, Venous: 2 mmol/L (ref 0.5–1.9)
Lactic Acid, Venous: 1.3 mmol/L (ref 0.5–1.9)

## 2022-07-10 LAB — ETHANOL: Alcohol, Ethyl (B): <10 mg/dL (ref <10)

## 2022-07-10 LAB — AMMONIA: Ammonia: 35 umol/L (ref 9–35)

## 2022-07-10 MED ORDER — ONDANSETRON HCL 4 MG/2ML IJ SOLN: 4.0000 mg | Freq: Four times a day (QID) | INTRAMUSCULAR | Status: DC | PRN

## 2022-07-10 MED ORDER — ACETAMINOPHEN 325 MG PO TABS
650.0000 mg | ORAL_TABLET | Freq: Four times a day (QID) | ORAL | Status: DC | PRN
  Administered 2022-07-11 – 2022-07-13 (×4): 650 mg via ORAL
  Filled 2022-07-10 (×4): qty 2

## 2022-07-10 MED ORDER — FINASTERIDE 5 MG PO TABS
5.0000 mg | ORAL_TABLET | Freq: Every day | ORAL | Status: DC
  Administered 2022-07-10 – 2022-07-14 (×5): 5 mg via ORAL
  Filled 2022-07-10 (×6): qty 1

## 2022-07-10 MED ORDER — LACTATED RINGERS IV SOLN
150.0000 mL/h | INTRAVENOUS | Status: AC
  Administered 2022-07-10 – 2022-07-11 (×3): 150 mL/h via INTRAVENOUS

## 2022-07-10 MED ORDER — ACETAMINOPHEN 650 MG RE SUPP: 650.0000 mg | Freq: Four times a day (QID) | RECTAL | Status: DC | PRN

## 2022-07-10 MED ORDER — PANTOPRAZOLE SODIUM 40 MG PO TBEC
40.0000 mg | DELAYED_RELEASE_TABLET | Freq: Every day | ORAL | Status: DC
  Administered 2022-07-10 – 2022-07-14 (×5): 40 mg via ORAL
  Filled 2022-07-10 (×5): qty 1

## 2022-07-10 MED ORDER — LACTATED RINGERS IV BOLUS (SEPSIS)
1000.0000 mL | Freq: Once | INTRAVENOUS | Status: AC
  Administered 2022-07-10: 1000 mL via INTRAVENOUS

## 2022-07-10 MED ORDER — MIRABEGRON ER 25 MG PO TB24
25.0000 mg | ORAL_TABLET | Freq: Every day | ORAL | Status: DC
  Administered 2022-07-11 – 2022-07-14 (×5): 25 mg via ORAL
  Filled 2022-07-10 (×6): qty 1

## 2022-07-10 MED ORDER — METRONIDAZOLE 500 MG/100ML IV SOLN
500.0000 mg | Freq: Two times a day (BID) | INTRAVENOUS | Status: DC
  Administered 2022-07-10 – 2022-07-11 (×2): 500 mg via INTRAVENOUS
  Filled 2022-07-10 (×2): qty 100

## 2022-07-10 MED ORDER — SODIUM CHLORIDE 0.9 % IV BOLUS
1000.0000 mL | Freq: Once | INTRAVENOUS | Status: AC
  Administered 2022-07-10: 1000 mL via INTRAVENOUS

## 2022-07-10 MED ORDER — ENOXAPARIN SODIUM 40 MG/0.4ML IJ SOSY
40.0000 mg | PREFILLED_SYRINGE | INTRAMUSCULAR | Status: DC
  Administered 2022-07-10 – 2022-07-13 (×4): 40 mg via SUBCUTANEOUS
  Filled 2022-07-10 (×4): qty 0.4

## 2022-07-10 MED ORDER — SENNOSIDES-DOCUSATE SODIUM 8.6-50 MG PO TABS: 1.0000 | ORAL_TABLET | Freq: Every evening | ORAL | Status: DC | PRN

## 2022-07-10 MED ORDER — SODIUM CHLORIDE 0.9 % IV SOLN
2.0000 g | INTRAVENOUS | Status: DC
  Administered 2022-07-10: 2 g via INTRAVENOUS
  Filled 2022-07-10: qty 20

## 2022-07-10 MED ORDER — PRAVASTATIN SODIUM 20 MG PO TABS: 40.0000 mg | ORAL_TABLET | Freq: Every day | ORAL | Status: DC

## 2022-07-10 MED ORDER — ONDANSETRON HCL 4 MG PO TABS: 4.0000 mg | ORAL_TABLET | Freq: Four times a day (QID) | ORAL | Status: DC | PRN

## 2022-07-10 NOTE — Hospital Course (Signed)
Mr. 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  Pt reported being there for 3 days, too weak to get up.  See H&P for full HPI, but pt was reporting was sound like paranoid delusions including thinking people put snakes and poison in his house.  He reported left foot pain, but otherwise no symptoms other than profound weakness.  Vitals in the ED were normal.   Labs were most notable for CK 1615.  Lactic acid 2.0.  WBC 12.0, Hbg 12.9.  Hepatic function with albumin 2.8, AST 52, Tbili 1.6, indirect bili 1.3, direct bili 0.3, Tprotein 5.5 COVID by PCR was negative.    UDS negative.  UA no infection.  EtOH < 10. Head CT, CXR non-acute.  Left foot xray no fracture or dislocation, 2 mm linear radiopaque density @ lateral aspect 2nd toe (nonspecific)  Admitted to the hospital for further evaluation and management.  Further hospital course and management as outlined below.

## 2022-07-10 NOTE — Assessment & Plan Note (Addendum)
-   Pravastatin 40 mg daily resumed 

## 2022-07-10 NOTE — Assessment & Plan Note (Signed)
Resumed home finasteride 5 mg daily, mirabegron 25 mg daily

## 2022-07-10 NOTE — ED Notes (Signed)
Advised nurse that patient has ready bed 

## 2022-07-10 NOTE — ED Triage Notes (Signed)
Pt BIB [facility] from home C/O generalized weakness. Pt difficult to keep awake for triage questions.
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
## 2022-07-10 NOTE — Assessment & Plan Note (Signed)
-   PPI resumed °

## 2022-07-10 NOTE — ED Triage Notes (Signed)
First Nurse Note:  Pt via EMS from home. Pt called out for fall, EMS report him being on his front porch, pt c/o weakness and urinary issues. Pt is A&Ox4 and NAD

## 2022-07-10 NOTE — Assessment & Plan Note (Signed)
Unclear baseline mental status at this time, ?underlying dementia or mental health issue. Per H&P, pt seemed to be having paranoid delusional thinking on admission.   Could be related to rhabdo and dehydration. Vit B12, ammonia, UDS, etoh normal. CT head non-acute, showed atrophy. --Check thiamine level, TSH --MRI brain if persistent despite hydration --Delirium precautions --Avoid sedating medications as possible --Fall precautions --Mgmt of underlying issues as outlined

## 2022-07-10 NOTE — Assessment & Plan Note (Addendum)
Likely due to Rhabdomyolysis. RUQ U/S -- hepatic steatosis, gallbladder with sludge, no acute cholecystitis. --Follow LFT's

## 2022-07-10 NOTE — ED Notes (Signed)
2.0  l;actic
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
## 2022-07-10 NOTE — Assessment & Plan Note (Addendum)
RULED OUT - only one SIRS criteria  leukocytosis, otherwise afebrile, no tachycardia or tachypnea.  Mild lactic acidosis likely dehdyration, down x 3 days with no PO intake, resolved with IV hydration. --Follow blood cultures  --D/C'd antibiotics, no indication --Monitor clinically for s/sx's of infection, fever, worsening leukocytosis --?Left foot gout vs cellulitis -- will hold off antibiotics for now, check uric acid, inflammatory markers. --Monitor CBC daily

## 2022-07-10 NOTE — Assessment & Plan Note (Signed)
Patient is able to move his toes, pulses are intact, ankles are able to be flexed and extended X-ray of the left foot ordered 4/9 -- Pt reports RESOLVED --Continue to mointor

## 2022-07-10 NOTE — Code Documentation (Signed)
CODE SEPSIS - PHARMACY COMMUNICATION  **Broad Spectrum Antibiotics should be administered within 1 hour of Sepsis diagnosis**  Time Code Sepsis Called/Page Received: 1823  Antibiotics Ordered: ceftriaxone, metronidazole  Time of 1st antibiotic administration: 1837  Additional action taken by pharmacy: N/A    Manfred Shirts ,PharmD Clinical Pharmacist  07/10/2022  6:46 PM

## 2022-07-11 DIAGNOSIS — E162 Hypoglycemia, unspecified: Secondary | ICD-10-CM | POA: Diagnosis not present

## 2022-07-11 DIAGNOSIS — G9341 Metabolic encephalopathy: Secondary | ICD-10-CM | POA: Diagnosis not present

## 2022-07-11 DIAGNOSIS — E876 Hypokalemia: Secondary | ICD-10-CM | POA: Diagnosis not present

## 2022-07-11 LAB — GLUCOSE, CAPILLARY
Glucose-Capillary: 63 mg/dL — ABNORMAL LOW (ref 70–99)
Glucose-Capillary: 85 mg/dL (ref 70–99)
Glucose-Capillary: 76 mg/dL (ref 70–99)
Glucose-Capillary: 123 mg/dL — ABNORMAL HIGH (ref 70–99)
Glucose-Capillary: 119 mg/dL — ABNORMAL HIGH (ref 70–99)

## 2022-07-11 LAB — HEPATIC FUNCTION PANEL
ALT: 25 U/L (ref 0–44)
AST: 59 U/L — ABNORMAL HIGH (ref 15–41)
Albumin: 3.3 g/dL — ABNORMAL LOW (ref 3.5–5.0)
Alkaline Phosphatase: 66 U/L (ref 38–126)
Bilirubin, Direct: 0.3 mg/dL — ABNORMAL HIGH (ref 0.0–0.2)
Indirect Bilirubin: 1.5 mg/dL — ABNORMAL HIGH (ref 0.3–0.9)
Total Bilirubin: 1.8 mg/dL — ABNORMAL HIGH (ref 0.3–1.2)
Total Protein: 6.3 g/dL — ABNORMAL LOW (ref 6.5–8.1)

## 2022-07-11 LAB — CBC
HCT: 33.3 % — ABNORMAL LOW (ref 39.0–52.0)
Hemoglobin: 10.9 g/dL — ABNORMAL LOW (ref 13.0–17.0)
MCH: 30.8 pg (ref 26.0–34.0)
MCHC: 32.7 g/dL (ref 30.0–36.0)
MCV: 94.1 fL (ref 80.0–100.0)
Platelets: 232 10*3/uL (ref 150–400)
RBC: 3.54 MIL/uL — ABNORMAL LOW (ref 4.22–5.81)
RDW: 12.7 % (ref 11.5–15.5)
WBC: 9.5 10*3/uL (ref 4.0–10.5)
nRBC: 0 % (ref 0.0–0.2)

## 2022-07-11 LAB — BASIC METABOLIC PANEL
Anion gap: 7 (ref 5–15)
BUN: 19 mg/dL (ref 8–23)
CO2: 25 mmol/L (ref 22–32)
Calcium: 8.7 mg/dL — ABNORMAL LOW (ref 8.9–10.3)
Chloride: 108 mmol/L (ref 98–111)
Creatinine, Ser: 0.74 mg/dL (ref 0.61–1.24)
GFR, Estimated: >60 mL/min (ref >60)
Glucose, Bld: 65 mg/dL — ABNORMAL LOW (ref 70–99)
Potassium: 3.1 mmol/L — ABNORMAL LOW (ref 3.5–5.1)
Sodium: 140 mmol/L (ref 135–145)

## 2022-07-11 LAB — PROTIME-INR
INR: 1.2 (ref 0.8–1.2)
Prothrombin Time: 15 seconds (ref 11.4–15.2)

## 2022-07-11 LAB — IRON AND TIBC
Iron: 28 ug/dL — ABNORMAL LOW (ref 45–182)
Saturation Ratios: 12 % — ABNORMAL LOW (ref 17.9–39.5)
TIBC: 228 ug/dL — ABNORMAL LOW (ref 250–450)
UIBC: 200 ug/dL

## 2022-07-11 LAB — FERRITIN: Ferritin: 169 ng/mL (ref 24–336)

## 2022-07-11 LAB — VITAMIN B12: Vitamin B-12: 269 pg/mL (ref 180–914)

## 2022-07-11 LAB — PROCALCITONIN: Procalcitonin: <0.1 ng/mL

## 2022-07-11 LAB — CORTISOL-AM, BLOOD: Cortisol - AM: 11.6 ug/dL (ref 6.7–22.6)

## 2022-07-11 NOTE — Assessment & Plan Note (Signed)
K 3.1 today, replacing. Monitor BMP, Mg level, replace PRN

## 2022-07-11 NOTE — Progress Notes (Addendum)
Progress Note   [name] CWC:[id number] DOB: 1945/07/26 DOA: 07/10/2022     1 DOS: the patient was seen and examined on 07/11/2022   Brief hospital course: 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  Pt reported being there for 3 days, too weak to get up.  See H&P for full HPI, but pt was reporting was sound like paranoid delusions including thinking people put snakes and poison in his house.  He reported left foot pain, but otherwise no symptoms other than profound weakness.  Vitals in the ED were normal.   Labs were most notable for CK 1615.  Lactic acid 2.0.  WBC 12.0, Hbg 12.9.  Hepatic function with albumin 2.8, AST 52, Tbili 1.6, indirect bili 1.3, direct bili 0.3, Tprotein 5.5 COVID by PCR was negative.    UDS negative.  UA no infection.  EtOH < 10. Head CT, CXR non-acute.  Left foot xray no fracture or dislocation, 2 mm linear radiopaque density @ lateral aspect 2nd toe (nonspecific)  Admitted to the hospital for further evaluation and management.  Further hospital course and management as outlined below.  Assessment and Plan: * Acute metabolic encephalopathy Unclear baseline mental status at this time, ?underlying dementia or mental health issue. Per H&P, pt seemed to be having paranoid delusional thinking on admission.   Could be related to rhabdo and dehydration. Vit B12, ammonia, UDS, etoh normal. CT head non-acute, showed atrophy. --Check thiamine level, TSH --MRI brain if persistent despite hydration --Delirium precautions --Avoid sedating medications as possible --Fall precautions --Mgmt of underlying issues as outlined  SIRS (systemic inflammatory response syndrome) RULED OUT - only one SIRS criteria  leukocytosis, otherwise afebrile, no tachycardia or tachypnea.  Mild lactic  acidosis likely dehdyration, down x 3 days with no PO intake, resolved with IV hydration. --Follow blood cultures  --D/C antibiotics --Monitor clinically for s/sx's of infection, fever, worsening leukocytosis --?Left foot gout vs cellulitis -- will hold off antibiotics for now, check uric acid, inflammatory markers. --Monitor CBC daily  Hypokalemia K 3.1 today, replacing. Monitor BMP, Mg level, replace PRN  Hypoglycemia Glucose on AM BMP was 65. --Hypoglycemia protocol --CBG's to monitor --Check Hbg A1c, none on file  Left foot pain Patient is able to move his toes, pulses are intact, ankles are able to be flexed and extended X-ray of the left foot ordered  Elevated liver enzymes Likely due to Rhabdomyolysis. RUQ U/S -- hepatic steatosis, gallbladder with sludge, no acute cholecystitis. --Follow LFT's  BPH (benign prostatic hyperplasia) Continue finasteride, mirabegron  GERD (gastroesophageal reflux disease) PPI resumed  Hyperlipidemia Continue pravastatin   IDA (iron deficiency anemia) Chronic, stable. Monitor CBC. Check iron studies.  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 Follow up as scheduled with Dr. Orlie Dakin.        Subjective: Pt seen sleeping in bed this AM, woke to voice.  He reports left foot pain.  Says he feels very weak.  He did not have anything to eat or drink for the 3 days he was down on his porch.    Physical Exam: Vitals:   07/11/22 0041 07/11/22 0455 07/11/22 0804 07/11/22 1627  BP:  (!) 121/58 124/67 (!) 111/59  Pulse:  80 68 85  Resp:  16 18  18  Temp:  98 F (36.7 C) 98 F (36.7 C) 98 F (36.7 C)  TempSrc:  Oral    SpO2:  98% 98% 100%  Height: 5\' 10"  (1.778 m)      General exam: awake, alert, no acute distress, chronically ill appearing HEENT: atraumatic, edentulous, clear conjunctiva, anicteric sclera, moist mucus  membranes, hearing grossly normal  Respiratory system: CTAB, no wheezes, rales or rhonchi, normal respiratory effort. Cardiovascular system: normal S1/S2, RRR, no JVD, murmurs, rubs, gallops, no pedal edema.   Gastrointestinal system: soft, NT, ND, no HSM felt, +bowel sounds. Central nervous system: O x 3. Speech mildly abnormal seems due to edentulous, grips equal, no gross focal deficits. Extremities: moves all, left foot distally erythematous, tenderness of L 1st MTP, b/l onychomycosis, dry flaking skin on bilateral plantar feet Psychiatry: normal mood, congruent affect   Data Reviewed:  Notable labs --- K 3.1, glucose 65, calcium 8.7, albumin 3.3, AST 59, Tprotein 6.3, direct bili 0.3, indirect bili 1.5, Tbili 1.8, procal < 0.10, Hbg 10.9, WBC normalized 9.5k, normal AM cortisol 11.6  RUQ U/S - non-acute. Biliary sludge, no acute cholecystitis, hepatic steatosis.  Family Communication: None present, will attempt to call as time allows.    Disposition: Status is: Inpatient Remains inpatient appropriate because: Remains on IV hydration and evaluation ongoing   Planned Discharge Destination: Home    Time spent: 46 minutes  Author: Pennie Banter, DO 07/11/2022 4:56 PM  For on call review [email or web address].

## 2022-07-11 NOTE — Assessment & Plan Note (Signed)
Continue outpatient surveillance. °

## 2022-07-11 NOTE — Evaluation (Signed)
Occupational Therapy Evaluation Patient Details Name: Frank Buckley WAS MRN: [id number] DOB: [birth year] Today's Date: 07/11/2022   History of Present Illness presented to ER secondary to AMS, found down on porch (x3 days) with hallucinations (?); admitted for management of SIRs, AMS   Clinical Impression   Patient agreeable to OT evaluation. Pt presenting with decreased independence in self care, balance, functional mobility/transfers, endurance, and safety awareness. Pt likely poor historian. He reports living alone at baseline and completing ADLs/functional mobility at Mod I using SPC. Pt required Mod A for simulated toilet transfer, Max A for LB dressing, and set up A for seated grooming tasks. Pt with R lateral lean in sitting and required Mod-Max A to correct. He required frequent VC for redirection t/o session. Pt demonstrated deficits in ROM, coordination, strength, and vision this date. Pt will benefit from skilled acute OT services to address deficits noted below. OT recommends ongoing therapy upon discharge to maximize safety and independence with ADLs, decrease fall risk, decrease caregiver burden, and promote return to PLOF.     Recommendations for follow up therapy are one component of a multi-disciplinary discharge planning process, led by the attending physician.  Recommendations may be updated based on patient status, additional functional criteria and insurance authorization.   Assistance Recommended at Discharge Frequent or constant Supervision/Assistance  Patient can return home with the following A lot of help with walking and/or transfers;A lot of help with bathing/dressing/bathroom;Assistance with cooking/housework;Assist for transportation;Help with stairs or ramp for entrance    Functional Status Assessment  Patient has had a recent decline in their functional status and demonstrates the ability to make significant improvements in function in a reasonable and predictable amount  of time.  Equipment Recommendations  Other (comment) (defer to next venue of care)    Recommendations for Other Services       Precautions / Restrictions Precautions Precautions: Fall Restrictions Weight Bearing Restrictions: No      Mobility Bed Mobility               General bed mobility comments: NT, received/left in recliner    Transfers Overall transfer level: Needs assistance Equipment used: Rolling walker (2 wheels) Transfers: Sit to/from Stand Sit to Stand: Mod assist           General transfer comment: VC for hand placement and safe technique      Balance Overall balance assessment: Needs assistance Sitting-balance support: No upper extremity supported, Feet supported Sitting balance-Leahy Scale: Fair Sitting balance - Comments: pt with R lateral lean while sitting in recliner, required VC to self-correct Postural control: Right lateral lean Standing balance support: Bilateral upper extremity supported, During functional activity, Reliant on assistive device for balance Standing balance-Leahy Scale: Poor                             ADL either performed or assessed with clinical judgement   ADL Overall ADL's : Needs assistance/impaired     Grooming: Set up;Sitting;Wash/dry face               Lower Body Dressing: Maximal assistance;Sitting/lateral leans Lower Body Dressing Details (indicate cue type and reason): socks  Toilet Transfer: Moderate assistance;Rolling walker (2 wheels) Toilet Transfer Details (indicate cue type and reason): simulated                 Vision Baseline Vision/History: 1 Wears glasses Patient Visual Report: No change from baseline Vision Assessment?: Yes Tracking/Visual  Pursuits: Impaired - to be further tested in functional context;Requires cues, head turns, or add eye shifts to track Additional Comments: Pt reports wearing glasses 24/7 (not present), difficulty with visual tracking/pursuits  even with cues/head turns/eye shifts. Possible L side inattention     Perception     Praxis      Pertinent Vitals/Pain Pain Assessment Pain Assessment: Faces Faces Pain Scale: Hurts a little bit Pain Location: abdomen ("feels like a hernia" Pain Descriptors / Indicators: Grimacing, Sore Pain Intervention(s): Limited activity within patient's tolerance, Monitored during session, Repositioned     Hand Dominance Right   Extremity/Trunk Assessment Upper Extremity Assessment Upper Extremity Assessment: Generalized weakness;RUE deficits/detail;LUE deficits/detail RUE Deficits / Details: difficulty bringing RUE to face for grooming tasks (used L hand as "helper"), shoulder flexion AROM to ~90 deg, maintains in generally extended position, fair grip strength LUE Deficits / Details: possible "ape-hand" deformity L hand, shoulder flexion AROM to ~110 deg, maintains in generally extended position, poor grip strength   Lower Extremity Assessment Lower Extremity Assessment: Generalized weakness   Cervical / Trunk Assessment Cervical / Trunk Assessment:  (R lateral trunk lean (mild pushing towards R))   Communication Communication Communication: No difficulties   Cognition Arousal/Alertness: Awake/alert Behavior During Therapy: WFL for tasks assessed/performed Overall Cognitive Status: No family/caregiver present to determine baseline cognitive functioning         General Comments: Alert and oriented to basic information; continues to be very focused/perseverative on "people and animals getting into my house", required frequent VC for redirections.     General Comments       Exercises Other Exercises Other Exercises: OT provided education re: role of OT, OT POC, post acute recs, sitting up for all meals, EOB/OOB mobility with assistance, home/fall safety.     Shoulder Instructions      Home Living Family/patient expects to be discharged to:: Private residence Living  Arrangements: Alone Available Help at Discharge: Family;Available PRN/intermittently (pt reports having brothers nearby) Type of Home: House Home Access: Stairs to enter [employer] of Steps: 2   Home Layout: One level                   Additional Comments: Patient questionable historian; will verify with family as available      Prior Functioning/Environment Prior Level of Function : Independent/Modified Independent             Mobility Comments: Mod indep with SPC for household and community mobilization ADLs Comments: Mod I for ADLs, pt endorses driving at baseline        OT Problem List: Decreased strength;Decreased range of motion;Decreased activity tolerance;Impaired balance (sitting and/or standing);Impaired vision/perception;Decreased coordination;Decreased cognition;Decreased safety awareness;Decreased knowledge of use of DME or AE;Decreased knowledge of precautions;Pain      OT Treatment/Interventions: Self-care/ADL training;Therapeutic exercise;Neuromuscular education;Energy conservation;DME and/or AE instruction;Manual therapy;Modalities;Balance training;Patient/family education;Visual/perceptual remediation/compensation;Cognitive remediation/compensation;Therapeutic activities;Splinting    OT Goals(Current goals can be found in the care plan section) Acute Rehab OT Goals Patient Stated Goal: return home OT Goal Formulation: With patient Time For Goal Achievement: 07/25/22 Potential to Achieve Goals: Fair   OT Frequency: Min 2X/week    Co-evaluation              AM-PAC OT "6 Clicks" Daily Activity     Outcome Measure Help from another person eating meals?: A Little Help from another person taking care of personal grooming?: A Little Help from another person toileting, which includes using toliet, bedpan, or urinal?: A Lot Help  from another person bathing (including washing, rinsing, drying)?: A Lot Help from another person to put on  and taking off regular upper body clothing?: A Little Help from another person to put on and taking off regular lower body clothing?: A Lot 6 Click Score: 15   End of Session Equipment Utilized During Treatment: Gait belt;Rolling walker (2 wheels) Nurse Communication: Mobility status  Activity Tolerance: Patient tolerated treatment well Patient left: in chair;with call bell/phone within reach;with chair alarm set  OT Visit Diagnosis: Muscle weakness (generalized) (M62.81);Other abnormalities of gait and mobility (R26.89)                Time: [id number] OT Time Calculation (min): 26 min Charges:  OT General Charges $OT Visit: 1 Visit OT Evaluation $OT Eval Moderate Complexity: 1 Mod  [facility] CampsuNatalie Phyllis Whitefield MS, OTR/L ascom [phone]  07/11/22, 3:24 PM

## 2022-07-11 NOTE — Evaluation (Signed)
Physical Therapy Evaluation Patient Details Name: Frank Buckley MRN: [id number] DOB: 1945/08/22 Today's Date: 07/11/2022  History of Present Illness  presented to ER secondary to AMS, found down on porch (x3 days) with hallucinations (?); admitted for management of SIRs, AMS  Clinical Impression  Patient resting in bed upon arrival to room, R lateral lean (shoulder against bedrail); mod/max assist to correct to midline. Patient alert and oriented to self, location and general situation; however, continues to recount reports of people, animals in home (in tremendous detail).  Difficult to redirect.  Endorses mild pain/soreness in bilat feet; 1-2/10 PAINAD (difficulty to fully describe).  Maintains head/neck in R cervical rotation; does rotate to L and visually attention to L of midline with direct cuing from therapist.  Patient generally weak throughout all extremities (UEs > LEs) with noted difficulty with purposeful activation/termination of extremities with functional tasks.  Tends to maintain bilat UEs in generally extended position (but able to break/flex with cuing/focused intention).  Currently requiring mod/max assist for bed mobility, poor dissociation of trunk and extremities; mod assist for sit/stand and bed/chair with RW.  Demonstrates R lateral lean/weight shift (mild pushing with attempts at correction).  Limited ability to position R LE and maintain in active WBing (tends to adduct in static stance).  Supports weight in R LE stance/loading without buckling, but difficulty advancing and breaking generalized extension with exertional activities Of note, patient does speak to some level of cervical surgery/spinal cord injury in previous medical history; unable to gain additional details from chart review.  MD informed/aware of current state and above report; to discuss further with family and consider additional work-up as appropriate. Would benefit from skilled PT to address above deficits and  promote optimal return to PLOF.; will benefit optimally from moderate intensity post-acute PT services (<3 hours/day) and consistent assist for ADLs/mobility.      Recommendations for follow up therapy are one component of a multi-disciplinary discharge planning process, led by the attending physician.  Recommendations may be updated based on patient status, additional functional criteria and insurance authorization.  Follow Up Recommendations Can patient physically be transported by private vehicle: No     Assistance Recommended at Discharge Frequent or constant Supervision/Assistance  Patient can return home with the following  Two people to help with bathing/dressing/bathroom;A lot of help with walking and/or transfers    Equipment Recommendations    Recommendations for Other Services       Functional Status Assessment Patient has had a recent decline in their functional status and demonstrates the ability to make significant improvements in function in a reasonable and predictable amount of time.     Precautions / Restrictions Precautions Precautions: Fall Restrictions Weight Bearing Restrictions: No      Mobility  Bed Mobility Overal bed mobility: Needs Assistance Bed Mobility: Supine to Sit     Supine to sit: Mod assist, Max assist     General bed mobility comments: poor dissociation of trunk/extremities    Transfers Overall transfer level: Needs assistance Equipment used: Rolling walker (2 wheels) Transfers: Sit to/from Stand, Bed to chair/wheelchair/BSC Sit to Stand: Mod assist Stand pivot transfers: Mod assist         General transfer comment: R lateral lean/weight shift (mild pushing with attempts at correction).  Limited ability to position R LE and maintain in active WBing (tends to adduct in static stance).  Supports weight in R LE stance/loading without buckling, but difficulty advancing and breaking generalized extension with exertional  activities  Once positioned in recliner, wedge under R IT and pillow support behind R shoulder/back to promote optimal midline positioning in chair.    Ambulation/Gait               General Gait Details: unsafe/unable  Stairs            Wheelchair Mobility    Modified Rankin (Stroke Patients Only)       Balance Overall balance assessment: Needs assistance Sitting-balance support: No upper extremity supported, Feet supported Sitting balance-Leahy Scale: Good     Standing balance support: Bilateral upper extremity supported Standing balance-Leahy Scale: Poor                               Pertinent Vitals/Pain Pain Assessment Pain Assessment: PAINAD Breathing: normal Negative Vocalization: none Facial Expression: smiling or inexpressive Body Language: tense, distressed pacing, fidgeting Consolability: no need to console PAINAD Score: 1 Pain Location: bilat feet, heels Pain Descriptors / Indicators: Grimacing Pain Intervention(s): Limited activity within patient's tolerance, Monitored during session, Repositioned    Home Living Family/patient expects to be discharged to:: Private residence Living Arrangements: Alone   Type of Home: House Home Access: Stairs to enter   Secretary/administratorntrance Stairs-Number of Steps: 2   Home Layout: One level   Additional Comments: Patient questionable historian; will verify with family as available    Prior Function Prior Level of Function : Independent/Modified Independent             Mobility Comments: Mod indep with SPC for ADLs, household and community mobilization       Hand Dominance   Dominant Hand: Right    Extremity/Trunk Assessment   Upper Extremity Assessment Upper Extremity Assessment: Generalized weakness (grossly 2+ to 3-/5 throughout; difficulty with isolated activation/termination (L > R), tends to maintain in generally extended position; mild 'ape-hand' deformity L hand)    Lower  Extremity Assessment Lower Extremity Assessment: Generalized weakness (grossly at least 3-/5 throughout; difficulty with purposeful, isolated movement)    Cervical / Trunk Assessment Cervical / Trunk Assessment:  (R lateral trunk lean (mild pushing towards R))  Communication   Communication: No difficulties  Cognition Arousal/Alertness: Awake/alert Behavior During Therapy: WFL for tasks assessed/performed Overall Cognitive Status: No family/caregiver present to determine baseline cognitive functioning                                 General Comments: Alert and oriented to basic information; continues to be very focused/perseverative on "people and animals getting into my house"        General Comments      Exercises Other Exercises Other Exercises: Maintains R-gaze, R cervical-rotation throughout session; does visually scan and rotate head/neck to L with cuing, but does not sustain or utilize functionally.  Do question generalized inattention to L Other Exercises: Unsupported sitting balance, weight shifting activities to promote midline awareness, min/mod assist to maintain   Assessment/Plan    PT Assessment Patient needs continued PT services  PT Problem List Decreased strength;Decreased range of motion;Decreased activity tolerance;Decreased balance;Decreased mobility;Decreased coordination;Decreased cognition;Decreased knowledge of use of DME;Decreased safety awareness;Decreased knowledge of precautions       PT Treatment Interventions DME instruction;Gait training;Stair training;Functional mobility training;Therapeutic activities;Therapeutic exercise;Balance training;Patient/family education;Cognitive remediation    PT Goals (Current goals can be found in the Care Plan section)  Acute Rehab PT Goals Patient Stated Goal: to get  stronger PT Goal Formulation: With patient Time For Goal Achievement: 07/25/22 Potential to Achieve Goals: Fair    Frequency Min  3X/week     Co-evaluation               AM-PAC PT "6 Clicks" Mobility  Outcome Measure Help needed turning from your back to your side while in a flat bed without using bedrails?: A Lot Help needed moving from lying on your back to sitting on the side of a flat bed without using bedrails?: A Lot Help needed moving to and from a bed to a chair (including a wheelchair)?: A Lot Help needed standing up from a chair using your arms (e.g., wheelchair or bedside chair)?: A Lot Help needed to walk in hospital room?: A Lot Help needed climbing 3-5 steps with a railing? : A Lot 6 Click Score: 12    End of Session Equipment Utilized During Treatment: Gait belt Activity Tolerance: Patient tolerated treatment well Patient left: in chair;with call bell/phone within reach;with chair alarm set Nurse Communication: Mobility status PT Visit Diagnosis: Muscle weakness (generalized) (M62.81);Difficulty in walking, not elsewhere classified (R26.2)    Time: 5038-8828 PT Time Calculation (min) (ACUTE ONLY): 29 min   Charges:   PT Evaluation $PT Eval Moderate Complexity: 1 Mod          Ardith Lewman H. Manson Passey, PT, DPT, NCS 07/11/22, 1:10 PM [phone]

## 2022-07-11 NOTE — Assessment & Plan Note (Addendum)
Chronic, stable. Monitor CBC. Check iron studies.

## 2022-07-11 NOTE — Progress Notes (Signed)
Mobility Specialist - Progress Note   07/11/22 1554  Mobility  Activity Transferred to/from [facility]  Level of Assistance Moderate assist, patient does 50-74%  Assistive Device Front wheel walker  Distance Ambulated (ft) 3 ft  Activity Response Tolerated fair  $Mobility charge 1 Mobility     Pt sitting in recliner upon arrival, utilizing RA. Pt requesting assistance to [facility]. ModA STS to RW with verbal/tactile cues for hand placement onto RW. Post lean in standing. Pt able to small step-pivot to [facility] with modA; +2 for safety. Pt left on [facility] with nursing staff present.    Frank Buckley Mobility Specialist 07/11/22, 4:04 PM

## 2022-07-11 NOTE — Progress Notes (Signed)
Patient is edentulous and has very poor dexterity of his hands. tongue is midline and moist. he is able to chew and swallow the pudding and the crackers without difficulty but he did cough after swallowing the sprite. MD aware.   Cornell Barman Aurel Nguyen

## 2022-07-11 NOTE — Assessment & Plan Note (Signed)
Glucose on AM BMP was 65. --Hypoglycemia protocol --CBG's to monitor --Check Hbg A1c, none on file

## 2022-07-11 NOTE — Assessment & Plan Note (Addendum)
Under surveillance. Follow up as scheduled with Dr. Orlie Dakin.

## 2022-07-12 DIAGNOSIS — G959 Disease of spinal cord, unspecified: Secondary | ICD-10-CM | POA: Diagnosis present

## 2022-07-12 DIAGNOSIS — G9341 Metabolic encephalopathy: Secondary | ICD-10-CM | POA: Diagnosis not present

## 2022-07-12 LAB — GLUCOSE, CAPILLARY
Glucose-Capillary: 98 mg/dL (ref 70–99)
Glucose-Capillary: 149 mg/dL — ABNORMAL HIGH (ref 70–99)
Glucose-Capillary: 117 mg/dL — ABNORMAL HIGH (ref 70–99)
Glucose-Capillary: 121 mg/dL — ABNORMAL HIGH (ref 70–99)

## 2022-07-12 LAB — CBC
HCT: 30.7 % — ABNORMAL LOW (ref 39.0–52.0)
Hemoglobin: 10.1 g/dL — ABNORMAL LOW (ref 13.0–17.0)
MCH: 30.8 pg (ref 26.0–34.0)
MCHC: 32.9 g/dL (ref 30.0–36.0)
MCV: 93.6 fL (ref 80.0–100.0)
Platelets: 223 10*3/uL (ref 150–400)
RBC: 3.28 MIL/uL — ABNORMAL LOW (ref 4.22–5.81)
RDW: 12.9 % (ref 11.5–15.5)
WBC: 8.1 10*3/uL (ref 4.0–10.5)
nRBC: 0 % (ref 0.0–0.2)

## 2022-07-12 LAB — COMPREHENSIVE METABOLIC PANEL
ALT: 25 U/L (ref 0–44)
AST: 45 U/L — ABNORMAL HIGH (ref 15–41)
Albumin: 2.8 g/dL — ABNORMAL LOW (ref 3.5–5.0)
Alkaline Phosphatase: 57 U/L (ref 38–126)
Anion gap: 6 (ref 5–15)
BUN: 19 mg/dL (ref 8–23)
CO2: 27 mmol/L (ref 22–32)
Calcium: 8.4 mg/dL — ABNORMAL LOW (ref 8.9–10.3)
Chloride: 107 mmol/L (ref 98–111)
Creatinine, Ser: 0.7 mg/dL (ref 0.61–1.24)
GFR, Estimated: >60 mL/min (ref >60)
Glucose, Bld: 106 mg/dL — ABNORMAL HIGH (ref 70–99)
Potassium: 3.1 mmol/L — ABNORMAL LOW (ref 3.5–5.1)
Sodium: 140 mmol/L (ref 135–145)
Total Bilirubin: 1.1 mg/dL (ref 0.3–1.2)
Total Protein: 5.9 g/dL — ABNORMAL LOW (ref 6.5–8.1)

## 2022-07-12 LAB — TSH: TSH: 1.178 u[IU]/mL (ref 0.350–4.500)

## 2022-07-12 LAB — CK: Total CK: 656 U/L — ABNORMAL HIGH (ref 49–397)

## 2022-07-12 LAB — PHOSPHORUS: Phosphorus: 2.6 mg/dL (ref 2.5–4.6)

## 2022-07-12 LAB — CULTURE, BLOOD (ROUTINE X 2): Culture: NO GROWTH

## 2022-07-12 LAB — MAGNESIUM: Magnesium: 1.9 mg/dL (ref 1.7–2.4)

## 2022-07-12 LAB — URIC ACID: Uric Acid, Serum: 4.9 mg/dL (ref 3.7–8.6)

## 2022-07-12 MED ORDER — POTASSIUM CHLORIDE CRYS ER 20 MEQ PO TBCR
40.0000 meq | EXTENDED_RELEASE_TABLET | ORAL | Status: AC
  Administered 2022-07-12 (×3): 40 meq via ORAL
  Filled 2022-07-12 (×3): qty 2

## 2022-07-12 NOTE — [place] FL2 (Signed)
[place] MEDICAID FL2 LEVEL OF CARE FORM     IDENTIFICATION  Patient Name: Frank Buckley Birthdate: July 09, 1945 Sex: male Admission Date (Current Location): 07/10/2022  [facility] and [place] Number:  Chiropodist and Address:         Provider Number: [id number]  Attending Physician Name and Address:  Pennie Banter, DO  Relative Name and Phone Number:       Current Level of Care: Hospital Recommended Level of Care: Skilled Nursing Facility Prior Approval Number:    Date Approved/Denied:   PASRR Number: [id number]  Discharge Plan: SNF    Current Diagnoses: Patient Active Problem List   Diagnosis Date Noted   Hypoglycemia 07/11/2022   Hypokalemia 07/11/2022   Acute metabolic encephalopathy 07/10/2022   Hyperlipidemia 07/10/2022   GERD (gastroesophageal reflux disease) 07/10/2022   SIRS (systemic inflammatory response syndrome) 07/10/2022   BPH (benign prostatic hyperplasia) 07/10/2022   Elevated liver enzymes 07/10/2022   Left foot pain 07/10/2022   IDA (iron deficiency anemia) 06/25/2020   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 06/04/2018   MGUS (monoclonal gammopathy of unknown significance) 01/29/2018   Abnormal CT scan 01/21/2018    Orientation RESPIRATION BLADDER Height & Weight     Self, Place  Normal Incontinent Weight:   Height:  5\' 10"  (177.8 cm)  BEHAVIORAL SYMPTOMS/MOOD NEUROLOGICAL BOWEL NUTRITION STATUS      Incontinent Diet (dys 2)  AMBULATORY STATUS COMMUNICATION OF NEEDS Skin   Extensive Assist Verbally Normal                       Personal Care Assistance Level of Assistance              Functional Limitations Info             SPECIAL CARE FACTORS FREQUENCY  PT (By licensed PT), OT (By licensed OT)                    Contractures Contractures Info: Not present    Additional Factors Info  Code Status, Allergies Code Status Info: full Allergies Info: penicillin           Current Medications  (07/12/2022):  This is the current hospital active medication list Current Facility-Administered Medications  Medication Dose Route Frequency Provider Last Rate Last Admin   acetaminophen (TYLENOL) tablet 650 mg  650 mg Oral Q6H PRN Cox, Amy N, DO   650 mg at 07/12/22 0218   Or   acetaminophen (TYLENOL) suppository 650 mg  650 mg Rectal Q6H PRN Cox, Amy N, DO       enoxaparin (LOVENOX) injection 40 mg  40 mg Subcutaneous Q24H Cox, Amy N, DO   40 mg at 07/11/22 2121   finasteride (PROSCAR) tablet 5 mg  5 mg Oral Daily Cox, Amy N, DO   5 mg at 07/12/22 0920   mirabegron ER (MYRBETRIQ) tablet 25 mg  25 mg Oral Daily Cox, Amy N, DO   25 mg at 07/12/22 0920   ondansetron (ZOFRAN) tablet 4 mg  4 mg Oral Q6H PRN Cox, Amy N, DO       Or   ondansetron (ZOFRAN) injection 4 mg  4 mg Intravenous Q6H PRN Cox, Amy N, DO       pantoprazole (PROTONIX) EC tablet 40 mg  40 mg Oral Daily Cox, Amy N, DO   40 mg at 07/12/22 0921   senna-docusate (Senokot-S) tablet 1 tablet  1 tablet  Oral QHS PRN Cox, Amy N, DO         Discharge Medications: Please see discharge summary for a list of discharge medications.  Relevant Imaging Results:  Relevant Lab Results:   Additional Information ss [id number]  Chapman Fitch, RN

## 2022-07-12 NOTE — Assessment & Plan Note (Addendum)
Unclear chronicity.   Pt notes right finger 3, 4, 5 fixed in flexion. Reports remote history of neck surgery, suspect ACDF, Unable to locate records of this. He declines MRI.

## 2022-07-12 NOTE — Progress Notes (Addendum)
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  Pt reported being there for 3 days, too weak to get up.  See H&P for full HPI, but pt was reporting was sound like paranoid delusions including thinking people put snakes and poison in his house.  He reported left foot pain, but otherwise no symptoms other than profound weakness.  Vitals in the ED were normal.   Labs were most notable for CK 1615.  Lactic acid 2.0.  WBC 12.0, Hbg 12.9.  Hepatic function with albumin 2.8, AST 52, Tbili 1.6, indirect bili 1.3, direct bili 0.3, Tprotein 5.5 COVID by PCR was negative.    UDS negative.  UA no infection.  EtOH < 10. Head CT, CXR non-acute.  Left foot xray no fracture or dislocation, 2 mm linear radiopaque density @ lateral aspect 2nd toe (nonspecific)  Admitted to the hospital for further evaluation and management.  Further hospital course and management as outlined below.  Assessment and Plan: * Acute metabolic encephalopathy RESOLVED.  Unclear baseline mental status  ?underlying dementia or mental health issue. Per H&P, pt seemed to be having hallucinations / paranoid delusions. Could be related to rhabdo and dehydration. Vit B12, ammonia, UDS, etoh normal. CT head non-acute, showed atrophy. --Check thiamine level, TSH --Pt declines MRI even if offered anxiolytic --Delirium precautions --Avoid sedating medications as possible --Fall precautions --Mgmt of underlying issues as outlined  SIRS (systemic inflammatory response syndrome) RULED OUT - only one SIRS criteria  leukocytosis, otherwise afebrile, no tachycardia or tachypnea.  Mild lactic acidosis  likely dehdyration, down x 3 days with no PO intake, resolved with IV hydration. --Follow blood cultures  --D/C'd antibiotics, no indication --Monitor clinically for s/sx's of infection, fever, worsening leukocytosis --?Left foot gout vs cellulitis -- will hold off antibiotics for now, check uric acid, inflammatory markers. --Monitor CBC daily  Cervical myelopathy Unclear chronicity.   Pt notes right finger 3, 4, 5 fixed in flexion. Reports remote history of neck surgery, suspect ACDF, Unable to locate records of this. He declines MRI.  Hypokalemia K 3.1 today, replacing. Monitor BMP, Mg level, replace PRN  Hypoglycemia Glucose on AM BMP was 65. --Hypoglycemia protocol --CBG's to monitor --Check Hbg A1c, none on file  Left foot pain Patient is able to move his toes, pulses are intact, ankles are able to be flexed and extended X-ray of the left foot ordered 4/9 -- Pt reports RESOLVED --Continue to mointor  Elevated liver enzymes Likely due to Rhabdomyolysis. RUQ U/S -- hepatic steatosis, gallbladder with sludge, no acute cholecystitis. --Follow LFT's  BPH (benign prostatic hyperplasia) Continue finasteride, mirabegron  GERD (gastroesophageal reflux disease) PPI resumed  Hyperlipidemia Continue pravastatin   IDA (iron deficiency anemia) Chronic, stable. Monitor CBC. Check iron studies.  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 Follow up as scheduled with Dr. Orlie Dakin.        Subjective: Pt up in recliner when seen this AM.  He is feeling much better.  He recalls in detail the events leading to his admission.  When I asked about the snakes and poison mentioned  in H&P on admission, he tells an extremely detailed story of what sound like hallucinations he had.  He denies hallucinations here.  Denies left foot pain today.  Reports he cannot straighten his left  fingers, and had neck surgery in the past.  No other complaints.  Declines for me to call his family today, saying "not yet".  Confirms he lives alone.   Physical Exam: Vitals:   07/11/22 1627 07/11/22 2005 07/12/22 0427 07/12/22 0807  BP: (!) 111/59 128/71 124/61 126/77  Pulse: 85 100 82 86  Resp: 18 16 20 18   Temp: 98 F (36.7 C) 99 F (37.2 C) 98.5 F (36.9 C) 98.3 F (36.8 C)  TempSrc:  Oral Oral Oral  SpO2: 100% 96% 98% 97%  Height:       General exam: awake, alert, no acute distress, up in recliner, talkative HEENT: atraumatic, edentulous, clear conjunctiva, anicteric sclera, moist mucus membranes, hearing grossly normal  Respiratory system: CTAB, no wheezes, rales or rhonchi, normal respiratory effort. Cardiovascular system: normal S1/S2, RRR, no JVD, murmurs, rubs, gallops, no pedal edema.   Gastrointestinal system: soft, NT, ND Central nervous system: A&O x 3. Normal speech, no focal deficits Extremities: moves all, no edema, normal tone, hypertrophied MCP joints, left foot non-tender today Psychiatry: normal mood, congruent affect   Data Reviewed:  Notable labs --- K 3.1 (unchanged despite replacement), calcium 8.4, albumin 2.8, AST 45, Tprotein 5.9, direct bili 0.3, indirect bili 1.5, Tbili 1.8, procal < 0.10, Hbg 10.9, WBC normalized 9.5k, normal AM cortisol 11.6  CK trend: 1615 >> 656  RUQ U/S - non-acute. Biliary sludge, no acute cholecystitis, hepatic steatosis.  Family Communication: None present. Asked pt who I should call and he stated "no one quite yet"  Disposition: Status is: Inpatient Remains inpatient appropriate because: Remains on IV hydration for rhabdomyolysis   Planned Discharge Destination: Home    Time spent: 38 minutes  Author: Pennie BanterKelly A Bryton Romagnoli, DO 07/12/2022 2:39 PM  For on call review [email or web address].

## 2022-07-12 NOTE — Progress Notes (Signed)
Patient has had uneventful shift. Continues to want to get out of bed to bathroom, however, when he is attempted to stand he is weak and no safe for ambulation to the restroom with one person hand held assist. Less hallucinations verbalized this shift compared to yesterday. Patient appears to be more alert and oriented today.  Frank Buckley Dashel Goines

## 2022-07-12 NOTE — Progress Notes (Signed)
PT Cancellation Note  Patient Details Name: Frank Buckley MRN: [id number] DOB: 06-15-1945   Cancelled Treatment:    Reason Eval/Treat Not Completed: Other (comment).  Chart reviewed and attempted to see pt.  Pt in recliner upon entry and declined getting back into bed at this time.  Pt states he pulled out condom catheter and therapist attempted to notify nursing but unable to reach by phone.  Will re-attempt at later time/date as medically appropriate.     Nolon Bussing, PT, DPT Physical Therapist - [facility]  07/12/22, 2:35 PM

## 2022-07-12 NOTE — TOC Initial Note (Signed)
Transition of Care [facility](TOC) - Initial/Assessment Note    Patient Details  Name: Frank MatesGeorge W Tischer MRN: [id number] Date of Birth: 1945-09-27  Transition of Care [facility](TOC) CM/SW Contact:    Chapman FitchStephanie T Ryenn Howeth, RN Phone Number: 07/12/2022, 2:07 PM  Clinical Narrative:                  Patient A&O x2 Spoke with brother   Admitted WUJ:[id number]:Encephalopathy Admitted from: home alone PCP: Marvis MoellerMiles   Therapy recommending SNF Brother thomas in agreement PASRR obtained Fl2 sent for signature Bed search initiated        Patient Goals and CMS Choice            Expected Discharge Plan and Services                                              Prior Living Arrangements/Services                       Activities of Daily Living Home Assistive Devices/Equipment: Cane (specify quad or straight) ADL Screening (condition at time of admission) Patient's cognitive ability adequate to safely complete daily activities?: No Is the patient deaf or have difficulty hearing?: No Does the patient have difficulty seeing, even when wearing glasses/contacts?: No Does the patient have difficulty concentrating, remembering, or making decisions?: Yes Patient able to express need for assistance with ADLs?: Yes Does the patient have difficulty dressing or bathing?: Yes Independently performs ADLs?: No Communication: Independent Dressing (OT): Needs assistance Is this a change from baseline?: Change from baseline, expected to last <3days Grooming: Needs assistance Is this a change from baseline?: Change from baseline, expected to last <3 days Feeding: Independent Bathing: Needs assistance Is this a change from baseline?: Change from baseline, expected to last <3 days Toileting: Needs assistance Is this a change from baseline?: Change from baseline, expected to last <3 days Walks in Home: Needs assistance Is this a change from baseline?: Change from baseline, expected to last <3 days Does the  patient have difficulty walking or climbing stairs?: Yes Weakness of Legs: Both Weakness of Arms/Hands: Both  Permission Sought/Granted                  Emotional Assessment              Admission diagnosis:  Altered mental status [R41.82] Weakness [R53.1] Acute encephalopathy [G93.40] Patient Active Problem List   Diagnosis Date Noted   Hypoglycemia 07/11/2022   Hypokalemia 07/11/2022   Acute metabolic encephalopathy 07/10/2022   Hyperlipidemia 07/10/2022   GERD (gastroesophageal reflux disease) 07/10/2022   SIRS (systemic inflammatory response syndrome) 07/10/2022   BPH (benign prostatic hyperplasia) 07/10/2022   Elevated liver enzymes 07/10/2022   Left foot pain 07/10/2022   IDA (iron deficiency anemia) 06/25/2020   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 [place] [address] [place] [address] Phone: [phone] Fax: [phone]  [facility] [address] - [place], [place] - [address] [place] [address] Phone: [phone] Fax: [phone]     Social Determinants of Health (SDOH) Social History: SDOH Screenings   Food Insecurity: No Food  Insecurity (07/11/2022)  Housing: Low Risk  (07/11/2022)  Transportation Needs: No Transportation Needs (07/11/2022)  Utilities: Not At Risk (07/11/2022)  Tobacco Use: Medium Risk (07/10/2022)   SDOH Interventions:     Readmission Risk Interventions     No data to display

## 2022-07-12 NOTE — Evaluation (Cosign Needed)
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  Pt reported being there for 3 days, too weak to get up... pt was reporting was sound like paranoid delusions including thinking people put snakes and poison in his house.  He reported left foot pain, but otherwise no symptoms other than profound weakness."    Assessment / Plan / Recommendation  Clinical Impression   Pt seen today for BSE. Pt alert, cooperative, and pleasant t/o eval. Pt sitting up in chair w/ tray table in front of him upon ST arrival. Pt denied any difficulties w/ swallowing to ST. Pt expressed that he enjoys current diet of Dys 2 w/ thins because it is easier for him to eat in setting of edentulous status. Pt completed oral care independently. Pt left sitting up in chair w/ phone in reach.  Pt on RA; afebrile; WBC WNL.  OM exam completed and notable for edentulous status. No dentures available. Otherwise, lingual/labial strength/ROM/symmetry WFL.  Pt appears to present w/ a functional oropharyngeal swallow w/ modified Dys 2 diet in setting of edentulous status. Pt expressed contentment w/ current diet (Dys 2 w/ thin liquids) and exhibited no overt s/s of aspiration such as coughing, throat clearing, nor wet vocal quality following po's. Following  General aspiration precautions can help reduce risk of aspiration/aspiration pneumonia in ALL pts.  Pt observed w/ trials of thin liquids via straw, purees, and solids (softened in puree). Pt fed self w/ min assistance for tray set up. During oral phase, pt exhibited good bolus control/management, adequate gumming/mashing of soft solids, timely A-P bolus transfer, and clear oral cavity post-po's. During pharyngeal phase, pt exhibited seemingly timely pharyngeal swallow and clear vocal quality post-po's. No clinical s/s of aspiration noted such as coughing, throat clearing, not wet vocal quality.  Pt educated on General aspiration precautions (sitting upright, eating/drinking slowly, small sips/bites) and diet recommendation. Pt appreciative/agreed.  Recommend continue Dys 2 diet w/ thin liquids. Pt repeated stated he "liked this diet." Follow General aspiration precautions at all po's. Recommend meds whole in puree. Pt to feed self independently w/ min assistance for tray set up. No further skilled ST services indicated. ST will s/o. MD to reconsult if an new needs during this admission. NSG/MD/pt updated/agreed.  SLP Visit Diagnosis: Dysphagia, oral phase (R13.11)    Aspiration Risk  Mild aspiration risk;No limitations    Diet Recommendation   Recommend continue Dys 2 diet w/ thin liquids.  Medication Administration: Whole meds with puree    Other  Recommendations Oral Care Recommendations: Oral care BID;Oral care before and after PO    Recommendations for follow up therapy are one component of a multi-disciplinary discharge planning process, led by the attending physician.  Recommendations may be updated based on patient status, additional functional criteria and insurance authorization.  Follow up Recommendations Follow physician's recommendations for discharge plan and follow up therapies      Assistance Recommended at Discharge  Full  Functional Status Assessment Patient has had a  recent decline in their functional status and demonstrates the ability to make significant improvements in function in a reasonable and predictable amount of time.  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  Pt reported being there for 3 days, too weak to get up... pt was reporting was sound like paranoid delusions including thinking people put snakes and poison in his house.  He reported left foot pain, but otherwise no symptoms other than profound weakness." Type of Study: Bedside Swallow Evaluation Diet Prior to this Study: Dysphagia 2 (finely chopped);Thin liquids (Level 0) Temperature Spikes Noted: No (WBC 8.1) Respiratory Status: Room air History of Recent Intubation: No Behavior/Cognition: Alert;Cooperative;Pleasant mood Oral Cavity Assessment: Within Functional Limits Oral Care Completed by SLP: Yes Oral Cavity - Dentition: Edentulous Vision: Functional for self-feeding Self-Feeding Abilities: Able to feed self;Needs set up Patient Positioning: Upright in chair Baseline Vocal Quality: Normal Volitional Cough:  (NT) Volitional Swallow: Able to elicit    Oral/Motor/Sensory Function Overall Oral Motor/Sensory Function: Within functional limits   Ice Chips Ice chips: Not tested   Thin Liquid Thin Liquid: Within functional limits Presentation: Straw;Self Fed (8+ trials)    Nectar Thick Nectar Thick Liquid: Not tested   Honey Thick Honey Thick Liquid: Not tested   Puree Puree: Within functional limits Presentation: Spoon;Self Fed (~ 2 oz)   Solid     Solid: Within functional limits Presentation: Self Fed;Spoon (~4 trials, solids  moistened in applesauce)     Dennie Fetters Graduate Clinician [facility], Speech Pathology  Dennie Fetters 07/12/2022,1:45 PM

## 2022-07-12 NOTE — Progress Notes (Signed)
Mobility Specialist - Progress Note    07/12/22 1100  Mobility  Activity Ambulated with assistance in room;Transferred from bed to chair  Level of Assistance Minimal assist, patient does 75% or more  Assistive Device Front wheel walker  Distance Ambulated (ft) 4 ft  Activity Response Tolerated well  $Mobility charge 1 Mobility    Pt lying in bed upon arrival, utilizing RA. Pt completed bed mobility, STS, and ambulation this date with minA. Pt reports feeling better today than previous session. No LOB. VC for hand placement and sequencing steps. Pt left in chair with alarm set, needs in reach.    Filiberto Pinks Mobility Specialist 07/12/22, 11:31 AM

## 2022-07-13 DIAGNOSIS — G9341 Metabolic encephalopathy: Secondary | ICD-10-CM | POA: Diagnosis not present

## 2022-07-13 LAB — BASIC METABOLIC PANEL
Anion gap: 4 — ABNORMAL LOW (ref 5–15)
BUN: 11 mg/dL (ref 8–23)
CO2: 29 mmol/L (ref 22–32)
Calcium: 8.9 mg/dL (ref 8.9–10.3)
Chloride: 108 mmol/L (ref 98–111)
Creatinine, Ser: 0.64 mg/dL (ref 0.61–1.24)
GFR, Estimated: >60 mL/min (ref >60)
Glucose, Bld: 103 mg/dL — ABNORMAL HIGH (ref 70–99)
Potassium: 4.1 mmol/L (ref 3.5–5.1)
Sodium: 141 mmol/L (ref 135–145)

## 2022-07-13 LAB — CK: Total CK: 408 U/L — ABNORMAL HIGH (ref 49–397)

## 2022-07-13 LAB — GLUCOSE, CAPILLARY
Glucose-Capillary: 104 mg/dL — ABNORMAL HIGH (ref 70–99)
Glucose-Capillary: 116 mg/dL — ABNORMAL HIGH (ref 70–99)
Glucose-Capillary: 120 mg/dL — ABNORMAL HIGH (ref 70–99)
Glucose-Capillary: 120 mg/dL — ABNORMAL HIGH (ref 70–99)

## 2022-07-13 LAB — CULTURE, BLOOD (ROUTINE X 2)
Culture: NO GROWTH
Special Requests: ADEQUATE

## 2022-07-13 LAB — HEMOGLOBIN A1C
Hgb A1c MFr Bld: 5.8 % — ABNORMAL HIGH (ref 4.8–5.6)
Mean Plasma Glucose: 120 mg/dL

## 2022-07-13 NOTE — Progress Notes (Signed)
Occupational Therapy Treatment Patient Details Name: Frank Buckley MRN: [id number] DOB: 16-Jan-1946 Today's Date: 07/13/2022   History of present illness presented to ER secondary to AMS, found down on porch (x3 days) with hallucinations (?); admitted for management of SIRs, AMS   OT comments  Pt received seated in recliner, slightly sliding down. Appearing alert; willing to work with OT on repositioning in the chair and preparing for lunch to arrive. T/f MIN A with cues for hand placement. See flowsheet below for further details of session. Left seated in recliner, chair alarm on, lunch plate in lap (with towel) at pt's request, tray in front on tray table, with all needs in reach.     Recommendations for follow up therapy are one component of a multi-disciplinary discharge planning process, led by the attending physician.  Recommendations may be updated based on patient status, additional functional criteria and insurance authorization.    Assistance Recommended at Discharge Frequent or constant Supervision/Assistance  Patient can return home with the following  A lot of help with walking and/or transfers;A lot of help with bathing/dressing/bathroom;Assistance with cooking/housework;Assist for transportation;Help with stairs or ramp for entrance   Equipment Recommendations  Other (comment) (defer to next venue of care)    Recommendations for Other Services      Precautions / Restrictions Precautions Precautions: Fall Restrictions Weight Bearing Restrictions: No       Mobility Bed Mobility               General bed mobility comments: NT, received/left in recliner    Transfers Overall transfer level: Needs assistance Equipment used: Rolling walker (2 wheels) Transfers: Sit to/from Stand Sit to Stand: Min assist           General transfer comment: Pt stood at [facility] for OT to change pad in chair (slightly soiled and bunched up behind pt when OT found him); pt able to  remain standing for approx 20 seconds; posterior lean; cues to lean forward to RW; MIN A to maintain static standing balance chair pad was changed.     Balance           Standing balance support: Bilateral upper extremity supported, During functional activity, Reliant on assistive device for balance Standing balance-Leahy Scale: Poor Standing balance comment: Pt is at extremely high fall risk                           ADL either performed or assessed with clinical judgement   ADL Overall ADL's : Needs assistance/impaired Eating/Feeding: Set up;Sitting Eating/Feeding Details (indicate cue type and reason): OT assisted to open containers Grooming: Set up;Sitting;Wash/dry face                                      Extremity/Trunk Assessment Upper Extremity Assessment Upper Extremity Assessment: Generalized weakness (difficulty with active exension of fingers; 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 Lunch arrived during session and OT assisted to set up pt at tray; pt needing to have plate in lap because of UE difficulty reaching tray.    Pertinent Vitals/ Pain       Pain Assessment Pain Assessment: 0-10 Pain Score:  (unrated) Pain Location: BIL hands- "they feel like sandpaper" Pain Descriptors / Indicators: Tingling Pain Intervention(s): Limited activity within patient's tolerance, Monitored during session  Home Living                                          Prior Functioning/Environment               Frequency  Min 2X/week        Progress Toward Goals  OT Goals(current goals can now be found in the care plan section)  Progress towards OT goals: Progressing toward goals  Acute Rehab OT Goals Patient Stated Goal: Get better OT Goal Formulation: With patient Time For Goal Achievement: 07/25/22 Potential to Achieve Goals: Fair ADL Goals Pt Will Perform Grooming: with min guard assist;standing Pt Will Perform Lower Body Dressing: with min assist;sit to/from stand Pt Will Transfer to Toilet: with supervision;ambulating Pt Will Perform Toileting - Clothing Manipulation and hygiene: with min assist;sit to/from stand  Plan Discharge plan remains appropriate    Co-evaluation                 AM-PAC OT "6 Clicks" Daily Activity     Outcome Measure   Help from another person eating meals?: A Little Help from another person taking care of personal grooming?: A Little Help from another person toileting, which includes using toliet, bedpan, or urinal?: A Lot Help from another person bathing (including washing, rinsing, drying)?: A Lot Help from another person to put on and taking off regular upper body clothing?: A Little Help from another person to put on and taking off regular lower body clothing?: A Lot 6 Click Score: 15    End of Session Equipment Utilized During Treatment: Gait belt;Rolling walker (2 wheels)  OT Visit Diagnosis: Muscle weakness (generalized) (M62.81);Other abnormalities of gait and mobility (R26.89)   Activity Tolerance Patient tolerated treatment well   Patient Left in chair;with call bell/phone within reach;with chair alarm set   Nurse Communication Mobility status        Time: 6153-7943 OT Time Calculation (min): 16 min  Charges: OT General Charges $OT Visit: 1 Visit OT Treatments $Self Care/Home Management : 8-22 mins  Linward Foster, MS, OTR/L  Alvester Morin 07/13/2022, 2:08 PM

## 2022-07-13 NOTE — Progress Notes (Signed)
Physical Therapy Treatment Patient Details Name: Frank Buckley MRN: [id number] DOB: 12-25-1945 Today's Date: 07/13/2022   History of Present Illness presented to ER secondary to AMS, found down on porch (x3 days) with hallucinations (?); admitted for management of SIRs, AMS    PT Comments    Pt was long sitting in bed upon arrival. He is A and O x 4 and agreeable to session. Pt is extremely motivated however present far from his baseline abilities. He requires extensive assistance to safely exit bed, stand to RW, and ambulate to BR. Mod assist for all mobility, transfers, and gait. Pt did have successful BM. RN aware. Recommend RN staff stand pivot pt only, until his balance and safety improves. Pt will greatly benefit from continued skilled PT to maximize his independence and safety with all ADLs.    Recommendations for follow up therapy are one component of a multi-disciplinary discharge planning process, led by the attending physician.  Recommendations may be updated based on patient status, additional functional criteria and insurance authorization.  Follow Up Recommendations  Can patient physically be transported by private vehicle: No    Assistance Recommended at Discharge Frequent or constant Supervision/Assistance  Patient can return home with the following A lot of help with walking and/or transfers;A lot of help with bathing/dressing/bathroom;Assistance with cooking/housework;Assistance with feeding;Direct supervision/assist for medications management;Direct supervision/assist for financial management;Assist for transportation;Help with stairs or ramp for entrance   Equipment Recommendations  Other (comment) (Defer to next level of care)       Precautions / Restrictions Precautions Precautions: Fall Restrictions Weight Bearing Restrictions: No     Mobility  Bed Mobility Overal bed mobility: Needs Assistance Bed Mobility: Supine to Sit  Supine to sit: Mod assist      Transfers Overall transfer level: Needs assistance Equipment used: Rolling walker (2 wheels) Transfers: Sit to/from Stand Sit to Stand: Mod assist     Ambulation/Gait Ambulation/Gait assistance: Mod assist Gait Distance (Feet): 15 Feet Assistive device: Rolling walker (2 wheels) Gait Pattern/deviations: Ataxic, Scissoring, Narrow base of support, Step-to pattern Gait velocity: decreased  General Gait Details: Pt was able to ambulate to/from BR ~ 15 ft with RW + mod assist. pt is extremely unsteady with narrow/ataxic step to gait kinematics. Extremely high fall risk. recommend RN staff stand pivot only until pt is able to ambulate with improve sequencing and safety    Balance Overall balance assessment: Needs assistance Sitting-balance support: No upper extremity supported, Feet supported Sitting balance-Leahy Scale: Fair Sitting balance - Comments: no LOB in sitting however does have posterior LOB without intervention when applying underwear while seated EOB.   Standing balance support: Bilateral upper extremity supported, During functional activity, Reliant on assistive device for balance Standing balance-Leahy Scale: Poor Standing balance comment: Pt is at extremely high fall risk         Cognition Arousal/Alertness: Awake/alert Behavior During Therapy: WFL for tasks assessed/performed Overall Cognitive Status: No family/caregiver present to determine baseline cognitive functioning    General Comments: pt is A and O x 4           General Comments General comments (skin integrity, edema, etc.): Pt had successful BM on standard height toilet. required total assist for hygiene care to prevent LOB in standing while performing care      Pertinent Vitals/Pain Pain Assessment Pain Assessment: 0-10 Pain Score: 3  Pain Descriptors / Indicators: Grimacing, Sore Pain Intervention(s): Limited activity within patient's tolerance, Monitored during session, Premedicated before  session, Repositioned  PT Goals (current goals can now be found in the care plan section) Acute Rehab PT Goals Patient Stated Goal: to get stronger and move better Progress towards PT goals: Progressing toward goals    Frequency    Min 3X/week      PT Plan Current plan remains appropriate       AM-PAC PT "6 Clicks" Mobility   Outcome Measure  Help needed turning from your back to your side while in a flat bed without using bedrails?: A Lot Help needed moving from lying on your back to sitting on the side of a flat bed without using bedrails?: A Lot Help needed moving to and from a bed to a chair (including a wheelchair)?: A Lot Help needed standing up from a chair using your arms (e.g., wheelchair or bedside chair)?: A Lot Help needed to walk in hospital room?: A Lot Help needed climbing 3-5 steps with a railing? : A Lot 6 Click Score: 12    End of Session Equipment Utilized During Treatment: Gait belt Activity Tolerance: Patient tolerated treatment well Patient left: in chair;with call bell/phone within reach;with chair alarm set Nurse Communication: Mobility status PT Visit Diagnosis: Muscle weakness (generalized) (M62.81);Difficulty in walking, not elsewhere classified (R26.2)     Time: 8937-3428 PT Time Calculation (min) (ACUTE ONLY): 28 min  Charges:  $Gait Training: 8-22 mins $Therapeutic Activity: 8-22 mins                     Jetta Lout PTA 07/13/22, 10:39 AM

## 2022-07-13 NOTE — Progress Notes (Addendum)
Mobility Specialist - Progress Note   07/13/22 1456  Mobility  Activity Ambulated with assistance in hallway;Transferred to/from [facility]  Level of Assistance Contact guard assist, steadying assist  Assistive Device Front wheel walker  Distance Ambulated (ft) 150 ft  Activity Response Tolerated well  $Mobility charge 1 Mobility     Pt sitting in recliner upon arrival, utilizing RA. Pt motivated for ambulation and very appreciative throughout session. Chair follow for safety. Pt STS from recliner x2 with modA + extra time for balance. Forward-flexed lean; VC for corrective posture with pt correcting but not maintaining. Pt ambulated in hallway with CGA + RW. Narrow BOS, scissoring gait. Noted pt occasionally sliding RLE forward. Denied pain and SOB. Mild R lean into author during ambulation. VC to keep RW close to body. Pt returned to recliner with alarm set, needs in reach.    Filiberto Pinks Mobility Specialist 07/13/22, 3:02 PM

## 2022-07-13 NOTE — TOC Progression Note (Signed)
Transition of Care [facility]) - Progression Note    Patient Details  Name: Frank Buckley MRN: [id number] Date of Birth: December 29, 1945  [facility] CM/SW Contact  Chapman Fitch, RN Phone Number: 07/13/2022, 12:51 PM  Clinical Narrative:     Met with patient at bedside.  He is in agreement to SNF.  Bed offers presented to patient.  Patient accepts bed at [facility].   Accepted in HUB and notified Ricky at [facility] with TOC to start auth VM left for brother [name] to update        Expected Discharge Plan and Services                                               Social Determinants of Health (SDOH) Interventions SDOH Screenings   Food Insecurity: No Food Insecurity (07/11/2022)  Housing: Low Risk  (07/11/2022)  Transportation Needs: No Transportation Needs (07/11/2022)  Utilities: Not At Risk (07/11/2022)  Tobacco Use: Medium Risk (07/10/2022)    Readmission Risk Interventions     No data to display

## 2022-07-13 NOTE — Progress Notes (Signed)
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  Pt reported being there for 3 days, too weak to get up.  See H&P for full HPI, but pt was reporting was sound like paranoid delusions including thinking people put snakes and poison in his house.  He reported left foot pain, but otherwise no symptoms other than profound weakness. Patient admitted for acute rhabdomyolysis as well as acute metabolic encephalopathy   Assessment and Plan: * Acute metabolic encephalopathy RESOLVED.  Unclear baseline mental status  ?underlying dementia or mental health issue. Per H&P, pt seemed to be having hallucinations / paranoid delusions. Could be related to rhabdo and dehydration. Vit B12, ammonia, UDS, etoh normal. CT head non-acute, showed atrophy. --TSH level within normal limits --Pt declines MRI even if offered anxiolytic --Delirium precautions --Avoid sedating medications as possible --Fall precautions Fall precaution in place   SIRS (systemic inflammatory response syndrome) RULED OUT - only one SIRS criteria  leukocytosis, otherwise afebrile, no tachycardia or tachypnea.  Mild lactic acidosis likely dehdyration, down x 3 days with no PO intake, resolved with IV hydration. --Follow blood cultures  --D/C'd antibiotics, no indication --Monitor clinically for s/sx's of infection, fever, worsening  leukocytosis --?Left foot gout vs cellulitis -- will hold off antibiotics for now, check uric acid, inflammatory markers. --Monitor CBC daily   Cervical myelopathy Unclear chronicity.   Pt notes right finger 3, 4, 5 fixed in flexion. Reports remote history of neck surgery He declines MRI.   Hypokalemia Will continue repletion and monitoring   Hypoglycemia Glucose on AM BMP was 65. --Hypoglycemia protocol --CBG's to monitor -- A1c 5.8   Left foot pain-resolved Patient is able to move his toes, pulses are intact, ankles are able to be flexed and extended X-ray of the left foot did not show any fracture or dislocation    Elevated liver enzymes Likely due to Rhabdomyolysis. RUQ U/S -- hepatic steatosis, gallbladder with sludge, no acute cholecystitis. --Follow LFT's   BPH (benign prostatic hyperplasia) Continue finasteride, mirabegron   GERD (gastroesophageal reflux disease) PPI resumed   Hyperlipidemia Continue pravastatin    IDA (iron deficiency anemia) Chronic, stable. Monitor CBC. Check iron studies.   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 Follow up as scheduled with Dr. Orlie Dakin.     Physical Exam: General exam: awake, alert, no acute distress HEENT: atraumatic, edentulous, clear conjunctiva Respiratory system: CTAB, no wheezes Cardiovascular system: normal S1/S2, RRR, no JVD Gastrointestinal system: soft, NT, ND Central nervous system: A&O x 3. Normal speech Extremities: moves all, no edema, normal tone, hypertrophied MCP joints Psychiatry: normal mood, congruent affect    Data Reviewed: Laboratory data reviewed showing sodium 141 potassium 4.1 with glucose of 103  Family Communication: None present at bedside  Disposition: Status  is: Inpatient Remains inpatient appropriate because: Remains on IV hydration for rhabdomyolysis and also unsafe  discharge as patient needs placement.       Vitals:   07/12/22 1608 07/12/22 2003 07/13/22 0419 07/13/22 0745  BP: 132/81 128/76 131/78 (!) 151/77  Pulse: 85 85 68 67  Resp: 16 20 18 18   Temp: 98.3 F (36.8 C) 98.5 F (36.9 C) 97.7 F (36.5 C) 97.6 F (36.4 C)  TempSrc: Oral  Oral Oral  SpO2: 98% 97% 99% 99%  Height:         Time spent: 35 minutes  Author: Loyce Dys, MD 07/13/2022 3:18 PM  For on call review [email or web address].

## 2022-07-13 NOTE — Care Management Important Message (Signed)
Important Message  Patient Details  Name: Frank Buckley MRN: [id number] Date of Birth: Jan 29, 1946   Medicare Important Message Given:  Yes     Johnell Comings 07/13/2022, 10:45 AM

## 2022-07-14 DIAGNOSIS — G9341 Metabolic encephalopathy: Secondary | ICD-10-CM | POA: Diagnosis not present

## 2022-07-14 LAB — GLUCOSE, CAPILLARY
Glucose-Capillary: 95 mg/dL (ref 70–99)
Glucose-Capillary: 109 mg/dL — ABNORMAL HIGH (ref 70–99)

## 2022-07-14 LAB — VITAMIN B1: Vitamin B1 (Thiamine): 68.5 nmol/L (ref 66.5–200.0)

## 2022-07-14 LAB — CULTURE, BLOOD (ROUTINE X 2)

## 2022-07-14 MED ORDER — ACETAMINOPHEN 325 MG PO TABS: 650.0000 mg | ORAL_TABLET | Freq: Four times a day (QID) | ORAL | 0 refills | Status: DC | PRN

## 2022-07-14 NOTE — Progress Notes (Signed)
Physical Therapy Treatment Patient Details Name: Frank Buckley MRN: [id number] DOB: 1945/08/30 Today's Date: 07/14/2022   History of Present Illness presented to ER secondary to AMS, found down on porch (x3 days) with hallucinations (?); admitted for management of SIRs, AMS    PT Comments    Pt was sitting in recliner upon arrival. He is A and O + agreeable to session. Pt remains motivated and pleasant. He overall demonstrates much improved abilities to stand and ambulate. Still at high fall risk but balance is improving. Pt had successful BM during session. Pt will continue to benefit from skilled PT at DC to maximize his independence while returning to PLOF.    Recommendations for follow up therapy are one component of a multi-disciplinary discharge planning process, led by the attending physician.  Recommendations may be updated based on patient status, additional functional criteria and insurance authorization.     Assistance Recommended at Discharge Frequent or constant Supervision/Assistance  Patient can return home with the following A little help with walking and/or transfers;A little help with bathing/dressing/bathroom;Assistance with cooking/housework;Assistance with feeding;Direct supervision/assist for medications management;Direct supervision/assist for financial management;Assist for transportation;Help with stairs or ramp for entrance   Equipment Recommendations  Other (comment) (Defer to next level of care)       Precautions / Restrictions Precautions Precautions: Fall Restrictions Weight Bearing Restrictions: No     Mobility  Bed Mobility  General bed mobility comments: in recliner pre/post session    Transfers Overall transfer level: Needs assistance Equipment used: Rolling walker (2 wheels) Transfers: Sit to/from Stand Sit to Stand: Min guard  General transfer comment: Increased time to perform with vcs for improved technique. CGA for safety. much improved  from previous date.    Ambulation/Gait Ambulation/Gait assistance: Min guard, Min assist Gait Distance (Feet): 100 Feet Assistive device: Rolling walker (2 wheels) Gait Pattern/deviations: Narrow base of support, Trunk flexed, Decreased step length - right, Decreased step length - left, Decreased stance time - right, Decreased stance time - left, Decreased stride length Gait velocity: decreased  General Gait Details: Pt continues to ambulate with flexed posture/narrow BOS however pt much improved abilities and safety versus previous date observed   Balance Overall balance assessment: Needs assistance Sitting-balance support: No upper extremity supported, Feet supported Sitting balance-Leahy Scale: Fair     Standing balance support: Bilateral upper extremity supported, During functional activity, Reliant on assistive device for balance Standing balance-Leahy Scale: Fair       Cognition Arousal/Alertness: Awake/alert Behavior During Therapy: WFL for tasks assessed/performed Overall Cognitive Status: No family/caregiver present to determine baseline cognitive functioning    General Comments: Follows all commands; pleasant; motivated           General Comments General comments (skin integrity, edema, etc.): pt had successful BM during session      Pertinent Vitals/Pain Pain Assessment Pain Assessment: No/denies pain Pain Score: 0-No pain     PT Goals (current goals can now be found in the care plan section) Acute Rehab PT Goals Patient Stated Goal: Get better and return home Progress towards PT goals: Progressing toward goals    Frequency    Min 3X/week      PT Plan Current plan remains appropriate       AM-PAC PT "6 Clicks" Mobility   Outcome Measure  Help needed turning from your back to your side while in a flat bed without using bedrails?: A Lot Help needed moving from lying on your back to sitting on the side  of a flat bed without using bedrails?: A  Lot Help needed moving to and from a bed to a chair (including a wheelchair)?: A Lot Help needed standing up from a chair using your arms (e.g., wheelchair or bedside chair)?: A Little Help needed to walk in hospital room?: A Little Help needed climbing 3-5 steps with a railing? : A Lot 6 Click Score: 14    End of Session   Activity Tolerance: Patient tolerated treatment well Patient left: in chair;with call bell/phone within reach;with chair alarm set Nurse Communication: Mobility status PT Visit Diagnosis: Muscle weakness (generalized) (M62.81);Difficulty in walking, not elsewhere classified (R26.2)     Time: 3403-7096 PT Time Calculation (min) (ACUTE ONLY): 28 min  Charges:  $Gait Training: 8-22 mins $Therapeutic Activity: 8-22 mins                    Jetta Lout PTA 07/14/22, 10:53 AM

## 2022-07-14 NOTE — Discharge Summary (Signed)
Physician Discharge Summary   [name] MRN: [id number] DOB: Sep 30, 1945  Admit date:     07/10/2022  Discharge date: 07/14/22  Discharge Physician: Loyce Dys   PCP: Leanna Sato, MD    Discharge Diagnoses:   Acute metabolic encephalopathy the setting of acute rhabdomyolysis Possible underlying dementia Acute rhabdomyolysis-improved SIRS (systemic inflammatory response syndrome) RULED OUT - only one SIRS criteria  leukocytosis, otherwise afebrile, no tachycardia or tachypnea.   Cervical myelopathy Hypokalemia-resolved Hypoglycemia-resolved Left foot pain-resolved Elevated liver enzyme- Likely due to Rhabdomyolysis. BPH (benign prostatic hyperplasia) GERD (gastroesophageal reflux disease) Hyperlipidemia IDA (iron deficiency anemia) 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 MGUS (monoclonal gammopathy of unknown significance)    Hospital Course: 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  Pt reported being there for 3 days, too weak to get up.  See H&P for full HPI, but pt was reporting was sound like paranoid delusions including thinking people put snakes and poison in his house.  He reported left foot pain, but otherwise no symptoms other than profound weakness. Patient admitted for acute rhabdomyolysis as well as acute metabolic encephalopathy Patient received IV fluid therapy with significant improvement in his CPK levels.  Mental status returned to baseline and currently patient is cleared for discharge today and to follow-up with his outpatient hematologist/oncologist.  He will also need surveillance therapy for his lung nodule.    Consultants: None Procedures performed: None Disposition: Skilled nursing facility Diet recommendation:  Discharge Diet Orders (From admission, onward)      Start     Ordered   07/14/22 0000  Diet - low sodium heart healthy        07/14/22 1247           Cardiac diet DISCHARGE MEDICATION: Allergies as of 07/14/2022       Reactions   Penicillins Rash        Medication List     STOP taking these medications    meloxicam 15 MG tablet Commonly known as: MOBIC   sildenafil 20 MG tablet Commonly known as: REVATIO   traMADol 50 MG tablet Commonly known as: ULTRAM       TAKE these medications    acetaminophen 325 MG tablet Commonly known as: TYLENOL Take 2 tablets (650 mg total) by mouth every 6 (six) hours as needed for mild pain (or Fever >/= 101).   doxazosin 8 MG tablet Commonly known as: CARDURA Take 1 tablet (8 mg total) by mouth daily.   ergocalciferol 1.25 MG (50000 UT) capsule Commonly known as: VITAMIN D2 Take 1 capsule (50,000 Units total) by mouth once a week.   finasteride 5 MG tablet Commonly known as: PROSCAR Take 1 tablet (5 mg total) by mouth daily.   ibuprofen 600 MG tablet Commonly known as: ADVIL Take 1 tablet (600 mg total) by mouth every 6 (six) hours as needed for moderate pain.   Lotrimin AF Powder 2 % Aerp Generic drug: Miconazole Nitrate Apply 1 application topically 2 (two) times daily.   mirabegron ER 25 MG Tb24 tablet Commonly known as: MYRBETRIQ Take 1 tablet (25 mg total) by mouth daily.   omeprazole 20 MG capsule Commonly known as: PRILOSEC Take 20 mg by mouth daily.   pravastatin 40 MG tablet Commonly known as: PRAVACHOL SMARTSIG:1 Tablet(s) By Mouth Every Evening  Contact information for after-discharge care     Destination     [facility] .   Service: Skilled Nursing Contact information: [address] [facility] [place] [address] [phone]                    Discharge Exam: There were no vitals filed for this visit. General exam: awake, alert, no acute distress HEENT: atraumatic, edentulous, clear  conjunctiva Respiratory system: CTAB, no wheezes Cardiovascular system: normal S1/S2, RRR, no JVD Gastrointestinal system: soft, NT, ND Central nervous system: A&O x 3. Normal speech Extremities: moves all, no edema, normal tone, hypertrophied MCP joints Psychiatry: normal mood, congruent affect      Condition at discharge: good Discharge time spent: greater than 30 minutes.  Signed: Loyce Dys, MD [facility] 07/14/2022

## 2022-07-14 NOTE — TOC Transition Note (Signed)
Transition of Care [facility]) - CM/SW Discharge Note   Patient Details  Name: Frank Buckley MRN: [id number] Date of Birth: [birth year]  Transition of Care [facility] CM/SW Contact:  Chapman Fitch, RN Phone Number: 07/14/2022, 2:20 PM   Clinical Narrative:      Patient will DC to: [facility] Anticipated DC date:07/14/22  Family notified: VM left for brother [name] Transport byWendie Simmer  Per MD patient ready for DC to . RN, patient, and facility notified of DC. Discharge Summary sent to facility. RN given number for report. DC packet on chart. Ambulance transport requested for patient.  TOC signing off.  Bevelyn Ngo [facility] [phone]        Patient Goals and CMS Choice      Discharge Placement                         Discharge Plan and Services Additional resources added to the After Visit Summary for                                       Social Determinants of Health (SDOH) Interventions SDOH Screenings   Food Insecurity: No Food Insecurity (07/11/2022)  Housing: Low Risk  (07/11/2022)  Transportation Needs: No Transportation Needs (07/11/2022)  Utilities: Not At Risk (07/11/2022)  Tobacco Use: Medium Risk (07/10/2022)     Readmission Risk Interventions     No data to display

## 2022-07-14 NOTE — Progress Notes (Signed)
Report called to [place] at [place]. Belongings packed up

## 2022-07-14 NOTE — Progress Notes (Signed)
Mobility Specialist - Progress Note   07/14/22 0800  Mobility  Activity Ambulated with assistance in room;Transferred from bed to chair  Level of Assistance Contact guard assist, steadying assist  Assistive Device Front wheel walker  Distance Ambulated (ft) 5 ft  Activity Response Tolerated well  $Mobility charge 1 Mobility     Pt lying in bed upon arrival, utilizing RA. Pt voiced feeling fatigued and stiff today. Pt completed bed mobility and STS with minA. Ambulated to recliner with CGA, no LOB. Pt left in chair with alarm set, needs in reach, breakfast tray in reach.    Filiberto Pinks Mobility Specialist 07/14/22, 9:09 AM

## 2022-07-14 NOTE — TOC Progression Note (Signed)
SNF insurance approved 4/11 - 415, review due 4/15. Reference ID: [id number].   Ricky with [facility] notified

## 2022-07-15 LAB — CULTURE, BLOOD (ROUTINE X 2): Special Requests: ADEQUATE

## 2022-07-30 ENCOUNTER — Emergency Department: Payer: Medicare Other

## 2022-07-30 ENCOUNTER — Other Ambulatory Visit: Payer: Self-pay

## 2022-07-30 ENCOUNTER — Inpatient Hospital Stay
Admission: EM | Admit: 2022-07-30 | Discharge: 2022-08-08 | DRG: 029 | Disposition: A | Discharge status: Skilled Nursing Facility | Payer: Medicare Other | Attending: Internal Medicine | Admitting: Internal Medicine

## 2022-07-30 DIAGNOSIS — M6282 Rhabdomyolysis: Secondary | ICD-10-CM | POA: Diagnosis present

## 2022-07-30 DIAGNOSIS — E785 Hyperlipidemia, unspecified: Secondary | ICD-10-CM | POA: Diagnosis present

## 2022-07-30 DIAGNOSIS — G9589 Other specified diseases of spinal cord: Secondary | ICD-10-CM | POA: Diagnosis present

## 2022-07-30 DIAGNOSIS — R9389 Abnormal findings on diagnostic imaging of other specified body structures: Secondary | ICD-10-CM | POA: Diagnosis present

## 2022-07-30 DIAGNOSIS — R748 Abnormal levels of other serum enzymes: Secondary | ICD-10-CM | POA: Diagnosis present

## 2022-07-30 DIAGNOSIS — R079 Chest pain, unspecified: Secondary | ICD-10-CM | POA: Diagnosis present

## 2022-07-30 DIAGNOSIS — G959 Disease of spinal cord, unspecified: Secondary | ICD-10-CM

## 2022-07-30 DIAGNOSIS — R918 Other nonspecific abnormal finding of lung field: Secondary | ICD-10-CM | POA: Diagnosis present

## 2022-07-30 DIAGNOSIS — M4802 Spinal stenosis, cervical region: Secondary | ICD-10-CM | POA: Diagnosis present

## 2022-07-30 DIAGNOSIS — N400 Enlarged prostate without lower urinary tract symptoms: Secondary | ICD-10-CM | POA: Diagnosis present

## 2022-07-30 DIAGNOSIS — R7303 Prediabetes: Secondary | ICD-10-CM | POA: Diagnosis present

## 2022-07-30 DIAGNOSIS — D472 Monoclonal gammopathy: Secondary | ICD-10-CM | POA: Diagnosis present

## 2022-07-30 DIAGNOSIS — Z87891 Personal history of nicotine dependence: Secondary | ICD-10-CM | POA: Diagnosis not present

## 2022-07-30 DIAGNOSIS — Z880 Allergy status to penicillin: Secondary | ICD-10-CM

## 2022-07-30 DIAGNOSIS — Z79899 Other long term (current) drug therapy: Secondary | ICD-10-CM | POA: Diagnosis not present

## 2022-07-30 DIAGNOSIS — W19XXXA Unspecified fall, initial encounter: Principal | ICD-10-CM

## 2022-07-30 DIAGNOSIS — G992 Myelopathy in diseases classified elsewhere: Secondary | ICD-10-CM | POA: Diagnosis present

## 2022-07-30 DIAGNOSIS — K219 Gastro-esophageal reflux disease without esophagitis: Secondary | ICD-10-CM | POA: Diagnosis present

## 2022-07-30 DIAGNOSIS — G9341 Metabolic encephalopathy: Secondary | ICD-10-CM | POA: Diagnosis present

## 2022-07-30 DIAGNOSIS — R531 Weakness: Secondary | ICD-10-CM | POA: Diagnosis not present

## 2022-07-30 DIAGNOSIS — G729 Myopathy, unspecified: Secondary | ICD-10-CM | POA: Diagnosis not present

## 2022-07-30 LAB — BASIC METABOLIC PANEL
Anion gap: 8 (ref 5–15)
BUN: 13 mg/dL (ref 8–23)
CO2: 26 mmol/L (ref 22–32)
Calcium: 9 mg/dL (ref 8.9–10.3)
Chloride: 102 mmol/L (ref 98–111)
Creatinine, Ser: 0.69 mg/dL (ref 0.61–1.24)
GFR, Estimated: >60 mL/min (ref >60)
Glucose, Bld: 100 mg/dL — ABNORMAL HIGH (ref 70–99)
Potassium: 4 mmol/L (ref 3.5–5.1)
Sodium: 136 mmol/L (ref 135–145)

## 2022-07-30 LAB — HEPATIC FUNCTION PANEL
ALT: 25 U/L (ref 0–44)
AST: 33 U/L (ref 15–41)
Albumin: 3.2 g/dL — ABNORMAL LOW (ref 3.5–5.0)
Alkaline Phosphatase: 71 U/L (ref 38–126)
Bilirubin, Direct: 0.1 mg/dL (ref 0.0–0.2)
Indirect Bilirubin: 0.5 mg/dL (ref 0.3–0.9)
Total Bilirubin: 0.6 mg/dL (ref 0.3–1.2)
Total Protein: 6.7 g/dL (ref 6.5–8.1)

## 2022-07-30 LAB — CK: Total CK: 420 U/L — ABNORMAL HIGH (ref 49–397)

## 2022-07-30 LAB — TROPONIN I (HIGH SENSITIVITY)
Troponin I (High Sensitivity): 6 ng/L (ref <18)
Troponin I (High Sensitivity): 24 ng/L — ABNORMAL HIGH (ref <18)

## 2022-07-30 LAB — CBG MONITORING, ED: Glucose-Capillary: 99 mg/dL (ref 70–99)

## 2022-07-30 LAB — CBC
HCT: 32.6 % — ABNORMAL LOW (ref 39.0–52.0)
Hemoglobin: 10.6 g/dL — ABNORMAL LOW (ref 13.0–17.0)
MCH: 31.3 pg (ref 26.0–34.0)
MCHC: 32.5 g/dL (ref 30.0–36.0)
MCV: 96.2 fL (ref 80.0–100.0)
Platelets: 307 10*3/uL (ref 150–400)
RBC: 3.39 MIL/uL — ABNORMAL LOW (ref 4.22–5.81)
RDW: 13.1 % (ref 11.5–15.5)
WBC: 12.1 10*3/uL — ABNORMAL HIGH (ref 4.0–10.5)
nRBC: 0 % (ref 0.0–0.2)

## 2022-07-30 LAB — LIPASE, BLOOD: Lipase: 24 U/L (ref 11–51)

## 2022-07-30 MED ORDER — FINASTERIDE 5 MG PO TABS
5.0000 mg | ORAL_TABLET | Freq: Every day | ORAL | Status: DC
  Administered 2022-07-31 – 2022-08-08 (×8): 5 mg via ORAL
  Filled 2022-07-30 (×9): qty 1

## 2022-07-30 MED ORDER — SODIUM CHLORIDE 0.9 % IV BOLUS
500.0000 mL | Freq: Once | INTRAVENOUS | Status: AC
  Administered 2022-07-30: 500 mL via INTRAVENOUS

## 2022-07-30 MED ORDER — DOXAZOSIN MESYLATE 4 MG PO TABS
8.0000 mg | ORAL_TABLET | Freq: Every day | ORAL | Status: DC
  Administered 2022-07-31 – 2022-08-08 (×8): 8 mg via ORAL
  Filled 2022-07-30 (×3): qty 2
  Filled 2022-07-30: qty 1
  Filled 2022-07-30 (×7): qty 2

## 2022-07-30 MED ORDER — ACETAMINOPHEN 325 MG PO TABS: 650.0000 mg | ORAL_TABLET | Freq: Four times a day (QID) | ORAL | Status: DC | PRN

## 2022-07-30 MED ORDER — MORPHINE SULFATE (PF) 2 MG/ML IV SOLN: 2.0000 mg | INTRAVENOUS | Status: DC | PRN

## 2022-07-30 MED ORDER — ASPIRIN 81 MG PO CHEW
81.0000 mg | CHEWABLE_TABLET | Freq: Every day | ORAL | Status: DC
  Administered 2022-07-31 – 2022-08-08 (×8): 81 mg via ORAL
  Filled 2022-07-30 (×8): qty 1

## 2022-07-30 MED ORDER — IOHEXOL 350 MG/ML SOLN
100.0000 mL | Freq: Once | INTRAVENOUS | Status: AC | PRN
  Administered 2022-07-30: 100 mL via INTRAVENOUS

## 2022-07-30 MED ORDER — ENOXAPARIN SODIUM 40 MG/0.4ML IJ SOSY
40.0000 mg | PREFILLED_SYRINGE | INTRAMUSCULAR | Status: DC
  Administered 2022-07-31 – 2022-08-07 (×9): 40 mg via SUBCUTANEOUS
  Filled 2022-07-30 (×9): qty 0.4

## 2022-07-30 MED ORDER — LACTATED RINGERS IV SOLN: INTRAVENOUS | Status: AC

## 2022-07-30 MED ORDER — ASPIRIN 81 MG PO CHEW
324.0000 mg | CHEWABLE_TABLET | Freq: Once | ORAL | Status: AC
  Administered 2022-07-30: 324 mg via ORAL
  Filled 2022-07-30: qty 4

## 2022-07-30 MED ORDER — SODIUM CHLORIDE 0.9% FLUSH
3.0000 mL | Freq: Two times a day (BID) | INTRAVENOUS | Status: DC
  Administered 2022-07-31 – 2022-08-08 (×15): 3 mL via INTRAVENOUS

## 2022-07-30 MED ORDER — MIRABEGRON ER 25 MG PO TB24
25.0000 mg | ORAL_TABLET | Freq: Every day | ORAL | Status: DC
  Administered 2022-07-31 – 2022-08-08 (×8): 25 mg via ORAL
  Filled 2022-07-30 (×10): qty 1

## 2022-07-30 NOTE — ED Triage Notes (Addendum)
See first nurse note. To ED for unwitnessed fall from wheelchair to floor. Hx recent falls. Pt unsure how many falls has had. Remembers some of them. Appears somnolent and falling asleep in chair between, CBG checked and is 99. Just discharged from [facility] yesterday.

## 2022-07-30 NOTE — H&P (Signed)
History and Physical     [name] ZOX:[id number] DOB: 11-16-1945 DOA: 07/30/2022 DOS: the patient was seen and examined on 07/30/2022 PCP: Leanna Sato, MD   Patient coming from: Home  Chief Complaint: Weakness. Fall Chest pain.  HISTORY OF PRESENT ILLNESS: Frank Buckley is an 77 y.o. male seen in the emergency room for weakness fall and chest pain.Weakness/ fall /chest pain. Pt was rehab facility and was d/c yesterday.  Patient's chart shows that he was just discharged on 11 April after being admitted for rhabdomyolysis and acute metabolic encephalopathy in the setting of acute rhabdo.  Patient was very weak at that time and found to have elevated CPKs mental status was said to have improved and returned to baseline upon discharge.  Surveillance recommendation for his lung nodule.  The recurrent presentation of rhabdo and disorientation makes me think that patient is probably on a need to be placed in a facility.  And that patient is not able to manage himself.  Patient has a history of MGUS GERD hyperlipidemia BPH. Patient has past medical history of GERD hyperlipidemia and BPH who was recently discharged with similar presentation and rhabdomyolysis on 11 April. Patient also said to have delirium in previous admission. Pt states he is not able to walk and was jsut d/c from rehab   Past Medical History:  Diagnosis Date   Acute metabolic encephalopathy 07/10/2022   Borderline diabetic    Cancer (HCC)    Elevated liver enzymes 07/10/2022   GERD (gastroesophageal reflux disease)    Review of Systems  Unable to perform ROS: Dementia  Neurological:  Positive for weakness.   Allergies  Allergen Reactions   Penicillins Rash   Past Surgical History:  Procedure Laterality Date   CERVICAL FUSION     MEDICATIONS: Prior to Admission medications   Medication Sig Start Date End Date Taking? Authorizing Provider  acetaminophen (TYLENOL) 325 MG tablet Take 2 tablets  (650 mg total) by mouth every 6 (six) hours as needed for mild pain (or Fever >/= 101). 07/14/22   Loyce Dys, MD  doxazosin (CARDURA) 8 MG tablet Take 1 tablet (8 mg total) by mouth daily. 05/26/21   Michiel Cowboy A, PA-C  ergocalciferol (VITAMIN D2) 1.25 MG (50000 UT) capsule Take 1 capsule (50,000 Units total) by mouth once a week. 06/25/20   Mauro Kaufmann, NP  finasteride (PROSCAR) 5 MG tablet Take 1 tablet (5 mg total) by mouth daily. 05/26/21   Michiel Cowboy A, PA-C  ibuprofen (ADVIL,MOTRIN) 600 MG tablet Take 1 tablet (600 mg total) by mouth every 6 (six) hours as needed for moderate pain. 11/27/15   Evon Slack, PA-C  Miconazole Nitrate (LOTRIMIN AF POWDER) 2 % AERP Apply 1 application topically 2 (two) times daily. 11/24/18   Willy Eddy, MD  mirabegron ER (MYRBETRIQ) 25 MG TB24 tablet Take 1 tablet (25 mg total) by mouth daily. 03/17/22   Stoioff, Verna Czech, MD  omeprazole (PRILOSEC) 20 MG capsule Take 20 mg by mouth daily.    [provider]  pravastatin (PRAVACHOL) 40 MG tablet SMARTSIG:1 Tablet(s) By Mouth Every Evening 01/23/20   [provider]  ED Course: Pt in Ed awake afebrile O2 sats of 100% on room air.  Vitals:   07/30/22 1943 07/30/22 1943 07/30/22 1943 07/30/22 2147  BP: 131/68 131/68    Pulse: 68  68   Resp: 16 16    Temp:    99.1 F (37.3 C)  TempSrc:  Oral  SpO2: 100% 100%    Weight:      Height:       No intake/output data recorded. SpO2: 100 % Blood work in ed shows: CT cervical spine negative for any intracranial pathology no fracture or subluxation, anterior cervical discectomy and fusion at C3-C4.  Severe disc space height loss and osteophytosis of the remaining cervical levels. CMP shows glucose of 100 otherwise normal electrolytes LFTs and kidney function. Patient found to have elevated CPK of 09/06/2018. Troponin of 24. Leukocytosis of 12.1 hemoglobin of 10.6 which is chronic normal platelet count of 307.  Results  for orders placed or performed during the hospital encounter of 07/30/22 (from the past 72 hour(s))  CBG monitoring, ED     Status: None   Collection Time: 07/30/22  1:34 PM  Result Value Ref Range   Glucose-Capillary 99 70 - 99 mg/dL    Comment: Glucose reference range applies only to samples taken after fasting for at least 8 hours.  Basic metabolic panel     Status: Abnormal   Collection Time: 07/30/22  2:25 PM  Result Value Ref Range   Sodium 136 135 - 145 mmol/L   Potassium 4.0 3.5 - 5.1 mmol/L   Chloride 102 98 - 111 mmol/L   CO2 26 22 - 32 mmol/L   Glucose, Bld 100 (H) 70 - 99 mg/dL    Comment: Glucose reference range applies only to samples taken after fasting for at least 8 hours.   BUN 13 8 - 23 mg/dL   Creatinine, Ser 9.60 0.61 - 1.24 mg/dL   Calcium 9.0 8.9 - 45.4 mg/dL   GFR, Estimated >09 >81 mL/min    Comment: (NOTE) Calculated using the CKD-EPI Creatinine Equation (2021)    Anion gap 8 5 - 15    Comment: Performed at [facility], [address] [place], [place] [address]  CBC     Status: Abnormal   Collection Time: 07/30/22  2:25 PM  Result Value Ref Range   WBC 12.1 (H) 4.0 - 10.5 K/uL   RBC 3.39 (L) 4.22 - 5.81 MIL/uL   Hemoglobin 10.6 (L) 13.0 - 17.0 g/dL   HCT 82.9 (L) 56.2 - 13.0 %   MCV 96.2 80.0 - 100.0 fL   MCH 31.3 26.0 - 34.0 pg   MCHC 32.5 30.0 - 36.0 g/dL   RDW 86.5 78.4 - 69.6 %   Platelets 307 150 - 400 K/uL   nRBC 0.0 0.0 - 0.2 %    Comment: Performed at [facility], [address], [place], [place] [address]  Troponin I (High Sensitivity)     Status: None   Collection Time: 07/30/22  2:25 PM  Result Value Ref Range   Troponin I (High Sensitivity) 6 <18 ng/L    Comment: (NOTE) Elevated high sensitivity troponin I (hsTnI) values and significant  changes across serial measurements may suggest ACS but many other  chronic and acute conditions are known to elevate hsTnI results.  Refer to the "Links" section for chest pain  algorithms and additional  guidance. Performed at [facility], [address] [place], [place] [address]   CK     Status: Abnormal   Collection Time: 07/30/22  2:25 PM  Result Value Ref Range   Total CK 420 (H) 49 - 397 U/L    Comment: Performed at [facility], [address] [place], [place] [address]  Hepatic function panel     Status: Abnormal  Collection Time: 07/30/22  2:25 PM  Result Value Ref Range   Total Protein 6.7 6.5 - 8.1 g/dL   Albumin 3.2 (L) 3.5 - 5.0 g/dL   AST 33 15 - 41 U/L   ALT 25 0 - 44 U/L   Alkaline Phosphatase 71 38 - 126 U/L   Total Bilirubin 0.6 0.3 - 1.2 mg/dL   Bilirubin, Direct 0.1 0.0 - 0.2 mg/dL   Indirect Bilirubin 0.5 0.3 - 0.9 mg/dL    Comment: Performed at [facility], [address] [place], [place] [address]  Lipase, blood     Status: None   Collection Time: 07/30/22  2:25 PM  Result Value Ref Range   Lipase 24 11 - 51 U/L    Comment: Performed at [facility], [address], [place], [place] [address]  Troponin I (High Sensitivity)     Status: Abnormal   Collection Time: 07/30/22  9:00 PM  Result Value Ref Range   Troponin I (High Sensitivity) 24 (H) <18 ng/L    Comment: (NOTE) Elevated high sensitivity troponin I (hsTnI) values and significant  changes across serial measurements may suggest ACS but many other  chronic and acute conditions are known to elevate hsTnI results.  Refer to the "Links" section for chest pain algorithms and additional  guidance. Performed at [facility], [address] [place], [place] [address]     Lab Results  Component Value Date   CREATININE 0.69 07/30/2022   CREATININE 0.64 07/13/2022   CREATININE 0.70 07/12/2022      Latest Ref Rng & Units 07/30/2022    2:25 PM 07/13/2022    4:32 AM 07/12/2022    3:51 AM  CMP  Glucose 70 - 99 mg/dL 914  782  956   BUN 8 - 23 mg/dL 13  11  19    Creatinine 0.61 - 1.24 mg/dL 2.13  0.86  5.78   Sodium 135  - 145 mmol/L 136  141  140   Potassium 3.5 - 5.1 mmol/L 4.0  4.1  3.1   Chloride 98 - 111 mmol/L 102  108  107   CO2 22 - 32 mmol/L 26  29  27    Calcium 8.9 - 10.3 mg/dL 9.0  8.9  8.4   Total Protein 6.5 - 8.1 g/dL 6.7   5.9   Total Bilirubin 0.3 - 1.2 mg/dL 0.6   1.1   Alkaline Phos 38 - 126 U/L 71   57   AST 15 - 41 U/L 33   45   ALT 0 - 44 U/L 25   25    Unresulted Labs (From admission, onward)     Start     Ordered   07/31/22 0500  Comprehensive metabolic panel  Tomorrow morning,   STAT        07/30/22 2230   07/31/22 0500  CBC  Tomorrow morning,   STAT        07/30/22 2230   07/30/22 1530  Urinalysis, Routine w reflex microscopic -Urine, Clean Catch  Once,   URGENT       Question:  Specimen Source  Answer:  Urine, Clean Catch   07/30/22 1529           Pt has received : Orders Placed This Encounter  Procedures   DG Chest 2 View    If patient pregnant, contact provider.    Standing Status:   Standing    Number of Occurrences:   1  Order Specific Question:   Reason for Exam (SYMPTOM  OR DIAGNOSIS REQUIRED)    Answer:   weakness and unwitnessed falls    Order Specific Question:   Radiology Contrast Protocol - do NOT remove file path    Answer:   [email or web address]   CT Head Wo Contrast    Standing Status:   Standing    Number of Occurrences:   1   CT Cervical Spine Wo Contrast    Standing Status:   Standing    Number of Occurrences:   1   CT Angio Chest/Abd/Pel for Dissection W and/or Wo Contrast    Standing Status:   Standing    Number of Occurrences:   1    Order Specific Question:   Does the patient have a contrast media/X-ray dye allergy?    Answer:   No    Order Specific Question:   If indicated for the ordered procedure, I authorize the administration of contrast media per Radiology protocol    Answer:   Yes    Order Specific Question:   Radiology Contrast Protocol - do NOT remove file path    Answer:    [email or web address]   US SCROTUM W/DOPPLER    Standing Status:   Standing    Number of Occurrences:   1    Order Specific Question:   Symptom/Reason for Exam    Answer:   Testicle pain [244846]   Basic metabolic panel    Standing Status:   Standing    Number of Occurrences:   1   CBC    Standing Status:   Standing    Number of Occurrences:   1   CK    Standing Status:   Standing    Number of Occurrences:   1   Hepatic function panel    Standing Status:   Standing    Number of Occurrences:   1   Urinalysis, Routine w reflex microscopic -Urine, Clean Catch    Standing Status:   Standing    Number of Occurrences:   1    Order Specific Question:   Specimen Source    Answer:   Urine, Clean Catch [76]   Lipase, blood    Standing Status:   Standing    Number of Occurrences:   1   Comprehensive metabolic panel    Standing Status:   Standing    Number of Occurrences:   1   CBC    Standing Status:   Standing    Number of Occurrences:   1   Diet Heart Room service appropriate? Yes; Fluid consistency: Thin    Standing Status:   Standing    Number of Occurrences:   1    Order Specific Question:   Room service appropriate?    Answer:   Yes    Order Specific Question:   Fluid consistency:    Answer:   Thin   Document Height and Actual Weight    Use scales to weigh patient, not stated or estimated weight.    Standing Status:   Standing    Number of Occurrences:   1   Bladder scan    Standing Status:   Standing    Number of Occurrences:   1   In and Out Cath    Standing Status:   Standing    Number of Occurrences:   1   Maintain IV access    Standing Status:   Standing    Number of  Occurrences:   1   Vital signs    Standing Status:   Standing    Number of Occurrences:   1   Notify physician (specify)    Standing Status:   Standing    Number of Occurrences:   20    Order Specific Question:   Notify Physician    Answer:   for pulse less  than 55 or greater than 120    Order Specific Question:   Notify Physician    Answer:   for respiratory rate less than 12 or greater than 25    Order Specific Question:   Notify Physician    Answer:   for temperature greater than 100.5 F    Order Specific Question:   Notify Physician    Answer:   for urinary output less than 30 mL/hr for four hours    Order Specific Question:   Notify Physician    Answer:   for systolic BP less than 90 or greater than 160, diastolic BP less than 60 or greater than 100    Order Specific Question:   Notify Physician    Answer:   for new hypoxia w/ oxygen saturations < 88%   Progressive Mobility Protocol: No Restrictions    Standing Status:   Standing    Number of Occurrences:   1   Daily weights    Standing Status:   Standing    Number of Occurrences:   1   Intake and Output    Standing Status:   Standing    Number of Occurrences:   1   Do not place and if present remove PureWick    Standing Status:   Standing    Number of Occurrences:   1   Initiate Oral Care Protocol    Standing Status:   Standing    Number of Occurrences:   1   Initiate Carrier Fluid Protocol    Standing Status:   Standing    Number of Occurrences:   1   RN may order General Admission PRN Orders utilizing "General Admission PRN medications" (through manage orders) for the following patient needs: allergy symptoms (Claritin), cold sores (Carmex), cough (Robitussin DM), eye irritation (Liquifilm Tears), hemorrhoids (Tucks), indigestion (Maalox), minor skin irritation (Hydrocortisone Cream), muscle pain (Ben Gay), nose irritation (saline nasal spray) and sore throat (Chloraseptic spray).    Standing Status:   Standing    Number of Occurrences:   [id number]   Cardiac Monitoring Continuous x 48 hours Indications for use: Other; Other indications for use: chest pain    Standing Status:   Standing    Number of Occurrences:   1    Order Specific Question:   Indications for use:    Answer:    Other    Order Specific Question:   Other indications for use:    Answer:   chest pain   Swallow screen    Standing Status:   Standing    Number of Occurrences:   1   Full code    Standing Status:   Standing    Number of Occurrences:   1    Order Specific Question:   By:    Answer:   Other   Consult to hospitalist    Standing Status:   Standing    Number of Occurrences:   1    Order Specific Question:   Place call to:    Answer:   [id number]    Order Specific Question:  Reason for Consult    Answer:   Admit   Pulse oximetry check with vital signs    Standing Status:   Standing    Number of Occurrences:   1   Oxygen therapy Mode or (Route): Nasal cannula; Liters Per Minute: 2; Keep 02 saturation: greater than 92 %    Standing Status:   Standing    Number of Occurrences:   20    Order Specific Question:   Mode or (Route)    Answer:   Nasal cannula    Order Specific Question:   Liters Per Minute    Answer:   2    Order Specific Question:   Keep 02 saturation    Answer:   greater than 92 %   CBG monitoring, ED    Standing Status:   Standing    Number of Occurrences:   1   ED EKG    Unwitnessed fall    Standing Status:   Standing    Number of Occurrences:   1    Order Specific Question:   Reason for Exam    Answer:   Other (See Comments)   Admit to Inpatient (patient's expected length of stay will be greater than 2 midnights or inpatient only procedure)    Standing Status:   Standing    Number of Occurrences:   1    Order Specific Question:   Hospital Area    Answer:   [facility] [[id number]]    Order Specific Question:   Level of Care    Answer:   Telemetry Medical [104]    Order Specific Question:   Covid Evaluation    Answer:   Asymptomatic - no recent exposure (last 10 days) testing not required    Order Specific Question:   Diagnosis    Answer:   Chest pain [161096]    Order Specific Question:   Admitting Physician    Answer:   Darrold Junker     Order Specific Question:   Attending Physician    Answer:   Darrold Junker    Order Specific Question:   Certification:    Answer:   I certify this patient will need inpatient services for at least 2 midnights    Order Specific Question:   Estimated Length of Stay    Answer:   2   Aspiration precautions    Standing Status:   Standing    Number of Occurrences:   1   Fall precautions    Standing Status:   Standing    Number of Occurrences:   1    Meds ordered this encounter  Medications   sodium chloride 0.9 % bolus 500 mL   iohexol (OMNIPAQUE) 350 MG/ML injection 100 mL   aspirin chewable tablet 324 mg   aspirin chewable tablet 81 mg   doxazosin (CARDURA) tablet 8 mg   acetaminophen (TYLENOL) tablet 650 mg   finasteride (PROSCAR) tablet 5 mg   mirabegron ER (MYRBETRIQ) tablet 25 mg   sodium chloride flush (NS) 0.9 % injection 3 mL   morphine (PF) 2 MG/ML injection 2 mg   enoxaparin (LOVENOX) injection 40 mg   lactated ringers infusion    Admission Imaging : US SCROTUM W/DOPPLER  Result Date: 07/30/2022 CLINICAL DATA:  Bilateral chronic testicular pain EXAM: SCROTAL ULTRASOUND DOPPLER ULTRASOUND OF THE TESTICLES TECHNIQUE: Complete ultrasound examination of the testicles, epididymis, and other scrotal structures was performed. Color and spectral Doppler ultrasound  were also utilized to evaluate blood flow to the testicles. COMPARISON:  None Available. FINDINGS: Right testicle Measurements: 4.2 x 1.8 x 2.8 cm. Scattered calcifications are noted. No focal mass is seen. Left testicle Measurements: 4.5 x 1.8 x 2.3 cm. Small 3 mm testicular cyst is noted. Right epididymis:  6 mm epididymal cyst is noted. Left epididymis: Multiple epididymal cysts are noted measuring up to 6 mm. Hydrocele:  None visualized. Varicocele:  None visualized. Pulsed Doppler interrogation of both testes demonstrates normal low resistance arterial and venous waveforms bilaterally. IMPRESSION: Bilateral  epididymal cysts. Small testicular cyst on the left. No evidence of testicular mass or torsion. Electronically Signed   By: Alcide Clever M.D.   On: 07/30/2022 19:35   CT Head Wo Contrast  Result Date: 07/30/2022 CLINICAL DATA:  Unwitnessed fall, multiple recent falls EXAM: CT HEAD WITHOUT CONTRAST CT CERVICAL SPINE WITHOUT CONTRAST TECHNIQUE: Multidetector CT imaging of the head and cervical spine was performed following the standard protocol without intravenous contrast. Multiplanar CT image reconstructions of the cervical spine were also generated. RADIATION DOSE REDUCTION: This exam was performed according to the departmental dose-optimization program which includes automated exposure control, adjustment of the mA and/or kV according to patient size and/or use of iterative reconstruction technique. COMPARISON:  07/10/2022 FINDINGS: CT HEAD FINDINGS Brain: No evidence of acute infarction, hemorrhage, hydrocephalus, extra-axial collection or mass lesion/mass effect. Mild periventricular white matter hypodensity. Vascular: No hyperdense vessel or unexpected calcification. Skull: Normal. Negative for fracture or focal lesion. Sinuses/Orbits: No acute finding. Other: None. CT CERVICAL SPINE FINDINGS Alignment: Normal. Skull base and vertebrae: No acute fracture. No primary bone lesion or focal pathologic process. Soft tissues and spinal canal: No prevertebral fluid or swelling. No visible canal hematoma. Disc levels: Anterior cervical discectomy and fusion of C3-C4 with bony incorporation. Severe disc space height loss and osteophytosis of the remaining cervical levels, worst at C5-6. Upper chest: Negative. Other: None. IMPRESSION: 1. No acute intracranial pathology. Small-vessel white matter disease. 2. No fracture or static subluxation of the cervical spine. 3. Anterior cervical discectomy and fusion of C3-C4 with bony incorporation. Severe disc space height loss and osteophytosis of the remaining cervical  levels, worst at C5-C6. Electronically Signed   By: Jearld Lesch M.D.   On: 07/30/2022 18:27   CT Cervical Spine Wo Contrast  Result Date: 07/30/2022 CLINICAL DATA:  Unwitnessed fall, multiple recent falls EXAM: CT HEAD WITHOUT CONTRAST CT CERVICAL SPINE WITHOUT CONTRAST TECHNIQUE: Multidetector CT imaging of the head and cervical spine was performed following the standard protocol without intravenous contrast. Multiplanar CT image reconstructions of the cervical spine were also generated. RADIATION DOSE REDUCTION: This exam was performed according to the departmental dose-optimization program which includes automated exposure control, adjustment of the mA and/or kV according to patient size and/or use of iterative reconstruction technique. COMPARISON:  07/10/2022 FINDINGS: CT HEAD FINDINGS Brain: No evidence of acute infarction, hemorrhage, hydrocephalus, extra-axial collection or mass lesion/mass effect. Mild periventricular white matter hypodensity. Vascular: No hyperdense vessel or unexpected calcification. Skull: Normal. Negative for fracture or focal lesion. Sinuses/Orbits: No acute finding. Other: None. CT CERVICAL SPINE FINDINGS Alignment: Normal. Skull base and vertebrae: No acute fracture. No primary bone lesion or focal pathologic process. Soft tissues and spinal canal: No prevertebral fluid or swelling. No visible canal hematoma. Disc levels: Anterior cervical discectomy and fusion of C3-C4 with bony incorporation. Severe disc space height loss and osteophytosis of the remaining cervical levels, worst at C5-6. Upper chest: Negative. Other: None. IMPRESSION:  1. No acute intracranial pathology. Small-vessel white matter disease. 2. No fracture or static subluxation of the cervical spine. 3. Anterior cervical discectomy and fusion of C3-C4 with bony incorporation. Severe disc space height loss and osteophytosis of the remaining cervical levels, worst at C5-C6. Electronically Signed   By: Jearld Lesch  M.D.   On: 07/30/2022 18:27   CT Angio Chest/Abd/Pel for Dissection W and/or Wo Contrast  Result Date: 07/30/2022 CLINICAL DATA:  Acute aortic syndrome suspected unwitnessed fall from wheelchair, multiple recent falls, lung masses * Tracking Code: BO * EXAM: CT ANGIOGRAPHY CHEST, ABDOMEN AND PELVIS TECHNIQUE: Non-contrast CT of the chest was initially obtained. Multidetector CT imaging through the chest, abdomen and pelvis was performed using the standard protocol during bolus administration of intravenous contrast. Multiplanar reconstructed images and MIPs were obtained and reviewed to evaluate the vascular anatomy. RADIATION DOSE REDUCTION: This exam was performed according to the departmental dose-optimization program which includes automated exposure control, adjustment of the mA and/or kV according to patient size and/or use of iterative reconstruction technique. CONTRAST:  OMNIPAQUE IOHEXOL 350 MG/ML SOLN COMPARISON:  CT chest, 03/07/2022 FINDINGS: CTA CHEST FINDINGS VASCULAR Aorta: Satisfactory opacification of the aorta. Normal contour and caliber of the thoracic aorta. No evidence of aneurysm, dissection, or other acute aortic pathology. Scattered aortic atherosclerosis Cardiovascular: No evidence of pulmonary embolism on limited non-tailored examination. Normal heart size. No pericardial effusion. Review of the MIP images confirms the above findings. NON VASCULAR Mediastinum/Nodes: No enlarged mediastinal, hilar, or axillary lymph nodes. Thyroid gland, trachea, and esophagus demonstrate no significant findings. Lungs/Pleura: Mild centrilobular emphysema. Bilateral subsolid pulmonary nodules are unchanged, largest in the inferior anterior right upper lobe measuring 2.9 x 1.8 cm (series 10, image 77). Dependent bibasilar scarring and or atelectasis. No pleural effusion or pneumothorax. Musculoskeletal: No chest wall abnormality. No acute osseous findings. Review of the MIP images confirms the above  findings. CTA ABDOMEN AND PELVIS FINDINGS VASCULAR Normal contour and caliber of the abdominal aorta. No evidence of aneurysm, dissection, or other acute aortic pathology. Standard branching pattern of the abdominal aorta with solitary bilateral renal arteries. Mild mixed calcific atherosclerosis. Review of the MIP images confirms the above findings. NON-VASCULAR Hepatobiliary: No solid liver abnormality is seen. Unchanged, benign subcapsular hemangioma of the posterior right lobe of the liver, hepatic segment VII (series 8, image 119). No gallstones, gallbladder wall thickening, or biliary dilatation. Pancreas: Unremarkable. No pancreatic ductal dilatation or surrounding inflammatory changes. Spleen: Normal in size without significant abnormality. Adrenals/Urinary Tract: Adrenal glands are unremarkable. Kidneys are normal, without renal calculi, solid lesion, or hydronephrosis. Bladder is unremarkable. Stomach/Bowel: Stomach is within normal limits. Appendix appears normal. No evidence of bowel wall thickening, distention, or inflammatory changes. Colonic diverticulosis. Lymphatic: No enlarged abdominal or pelvic lymph nodes. Reproductive: No mass or other significant abnormality. Other: No abdominal wall hernia or abnormality. No ascites. Musculoskeletal: No acute osseous findings. IMPRESSION: 1. Normal contour and caliber of the thoracic and abdominal aorta. No evidence of aneurysm, dissection, or other acute aortic pathology. Mild mixed calcific atherosclerosis. 2. Bilateral subsolid pulmonary nodules are unchanged, largest in the inferior anterior right upper lobe measuring 2.9 x 1.8 cm. 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 3. Mild emphysema. 4. Colonic diverticulosis without evidence of acute diverticulitis. Aortic Atherosclerosis (ICD10-I70.0) and Emphysema (ICD10-J43.9). Electronically Signed   By: Jearld Lesch M.D.   On: 07/30/2022 18:23   DG Chest 2 View  Result Date:  07/30/2022 CLINICAL DATA:  Weakness and unwitnessed falls. EXAM:  CHEST - 2 VIEW COMPARISON:  July 10, 2022 FINDINGS: Tortuosity of the aorta. Cardiomediastinal silhouette is normal. Mediastinal contours appear intact. There is no evidence of focal airspace consolidation, pleural effusion or pneumothorax. Chronic elevation left hemidiaphragm. Osteoarthritic changes of the left shoulder. Soft tissues are grossly normal. IMPRESSION: 1. No active cardiopulmonary disease. 2. Tortuosity of the aorta. Electronically Signed   By: Ted Mcalpine M.D.   On: 07/30/2022 14:18   Physical Examination: Vitals:   07/30/22 1943 07/30/22 1943 07/30/22 1943 07/30/22 2147  BP: 131/68 131/68    Pulse: 68  68   Temp:    99.1 F (37.3 C)  Resp: 16 16    Height:      Weight:      SpO2: 100% 100%    TempSrc:    Oral  BMI (Calculated):       Physical Exam Vitals and nursing note reviewed.  Constitutional:      General: He is not in acute distress.    Appearance: He is not ill-appearing, toxic-appearing or diaphoretic.  HENT:     Head: Normocephalic and atraumatic.     Right Ear: Hearing and external ear normal.     Left Ear: Hearing and external ear normal.     Nose: Nose normal. No nasal deformity.     Mouth/Throat:     Lips: Pink.     Mouth: Mucous membranes are moist.     Tongue: No lesions.     Pharynx: Oropharynx is clear.  Eyes:     Extraocular Movements: Extraocular movements intact.  Cardiovascular:     Rate and Rhythm: Normal rate and regular rhythm.     Pulses: Normal pulses.     Heart sounds: Normal heart sounds.  Pulmonary:     Effort: Pulmonary effort is normal.     Breath sounds: Normal breath sounds.  Abdominal:     General: Bowel sounds are normal. There is no distension.     Palpations: Abdomen is soft. There is no mass.     Tenderness: There is no abdominal tenderness. There is no guarding.     Hernia: No hernia is present.  Musculoskeletal:     Right lower leg: No edema.      Left lower leg: No edema.  Skin:    General: Skin is warm.  Neurological:     General: No focal deficit present.     Mental Status: He is alert and oriented to person, place, and time.     Cranial Nerves: Cranial nerves 2-12 are intact.     Motor: Motor function is intact.  Psychiatric:        Attention and Perception: Attention normal.        Mood and Affect: Mood normal.        Speech: Speech normal.        Behavior: Behavior normal. Behavior is cooperative.        Cognition and Memory: Cognition normal.     Assessment and Plan: * Chest pain Suspect 2nd to lung mass.  TNI negative. CTA negative for PE.  Non-traumatic rhabdomyolysis Cont low rate mivf overnight and strict I/O.    BPH (benign prostatic hyperplasia) Continue proscar.  Monitor I/O.  GERD (gastroesophageal reflux disease) IV PPI.   Abnormal CT scan Lung nodule/ mass concerning for lung cancer. Pt to f/u with oncology and this is probably what is causing his chest pain.    DVT prophylaxis:  Lovenox 40 mg Trinity q d.  Code Status:  Full code     07/30/2022    1:32 PM  Advanced Directives  Does Patient Have a Medical Advance Directive? No    Family Communication:  None.  Emergency Contact: Contact Information     Name Relation Home Work [place] Brother [phone]     Stettler,Clint Brother   [phone]   Brandonn, Capelli   [phone]       Disposition Plan:  TBD.  Consults: None.  Admission status: Inpatient.   Unit / Expected LOS:  Med tele/ 2 days.   Gertha Calkin MD [facility]  6 PM- 2 AM. [phone]( Pager [phone]  For questions regarding this patient please use [email or web address] to contact the current [facility] MD.   Bonita Quin may also call [phone] to contact current [facility] Attending/Consulting MD for this patient.

## 2022-07-30 NOTE — ED Notes (Signed)
Attempted to start IV without success. IV team consult placed.

## 2022-07-30 NOTE — ED Notes (Signed)
Bladder scan 131 ml

## 2022-07-30 NOTE — Assessment & Plan Note (Signed)
Continue proscar.  Monitor I/O.

## 2022-07-30 NOTE — Assessment & Plan Note (Signed)
Lung nodule/ mass concerning for lung cancer. Pt to f/u with oncology and this is probably what is causing his chest pain.

## 2022-07-30 NOTE — Assessment & Plan Note (Signed)
Cont low rate mivf overnight and strict I/O.

## 2022-07-30 NOTE — ED Notes (Signed)
First nurse note:  Pt to ED [facility] from home for unwitnessed fall from wheelchair to floor, pt unsure may have hit posterior head, c/o chronic low back pain and posterior head pain  Just discharged from [facility] yesterday, hx recent falls  Abrasion to chin, not bleeding. Non ambulatory in wheelchair at baseline  Has not eaten/drunk today, states feels weak  EMS VS: 98/58 then 115/71, 98%, 68 HR

## 2022-07-30 NOTE — Assessment & Plan Note (Signed)
Suspect 2nd to lung mass.  TNI negative. CTA negative for PE.

## 2022-07-30 NOTE — Assessment & Plan Note (Signed)
IV PPI. 

## 2022-07-30 NOTE — ED Provider Notes (Signed)
[facility] Provider Note    Event Date/Time   First MD Initiated Contact with Patient 07/30/22 1515     (approximate)   History   Fall   HPI  Frank Buckley is a 77 y.o. 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  [facility].  Patient had a witnessed fall from wheelchair to floor with a history of recent falls.  There was some concern for patient feeling more weak due to not eating or drinking.  I did review patient's admission summary from 07/10/2022 where he was on the ground for 3 days and admitted for acute rhabdo  Patient reports that he was just discharged from his care facility yesterday back to home.  He states that he lives by himself.  He states that he was so weak that he had to have his family member carry him into the house where he then was placed into a wheelchair.  He states that typically he can use a wheelchair and walker but today he developed some chest pain that was radiating on either side of his chest as well as some abdominal pain.  He also reports some intermittent testicle pain but he states that that that was after he had a urological procedure done on his testicle.  He states that after he fell he was on the ground for only 1 to 2 hours and he was able to crawl to the door and was able to call EMS himself.    Physical Exam   Triage Vital Signs: ED Triage Vitals  Enc Vitals Group     BP 07/30/22 1331 96/65     Pulse Rate 07/30/22 1331 70     Resp 07/30/22 1331 14     Temp 07/30/22 1331 97.6 F (36.4 C)     Temp Source 07/30/22 1331 Oral     SpO2 07/30/22 1331 100 %     Weight 07/30/22 1330 160 lb (72.6 kg)     Height 07/30/22 1330 5\' 10"  (1.778 m)     Head Circumference --      Peak Flow --      Pain Score 07/30/22 1330 1     Pain Loc --      Pain Edu? --      Excl. in GC? --     Most recent  vital signs: Vitals:   07/30/22 1331  BP: 96/65  Pulse: 70  Resp: 14  Temp: 97.6 F (36.4 C)  SpO2: 100%     General: Awake, no distress.  CV:  Good peripheral perfusion.  Resp:  Normal effort.  Abd:  No distention.  Other:  Able to lift both legs up off the bed.  Equal grip strength.  Patient tender in his abdomen.  Normal testicle exam   ED Results / Procedures / Treatments   Labs (all labs ordered are listed, but only abnormal results are displayed) Labs Reviewed  BASIC METABOLIC PANEL - Abnormal; Notable for the following components:      Result Value   Glucose, Bld 100 (*)    All other components within normal limits  CBC - Abnormal; Notable for the following components:   WBC 12.1 (*)    RBC 3.39 (*)    Hemoglobin 10.6 (*)    HCT 32.6 (*)    All other components within normal limits  CBG MONITORING, ED  TROPONIN I (HIGH SENSITIVITY)  TROPONIN I (HIGH SENSITIVITY)     EKG  My interpretation of EKG:  Normal sinus rate 67 without any ST elevation or T wave inversions, normal intervals  RADIOLOGY I have reviewed the x-ray personally interpreted and no evidence of pneumonia  PROCEDURES:  Critical Care performed: No  Procedures   MEDICATIONS ORDERED IN ED: Medications - No data to display   IMPRESSION / MDM / ASSESSMENT AND PLAN / ED COURSE  I reviewed the triage vital signs and the nursing notes.   Patient's presentation is most consistent with acute presentation with potential threat to life or bodily function.   Patient comes in with chest pain, abdominal pain, borderline low blood pressures, recent discharge from facility with a fall within 24 hours.  Will get CT dissection to rule out dissection, CT head and CT cervical develop evaluate for intracranial hemorrhage, cervical fracture.  Will give patient some fluids.  Glucose normal at 99.  BMP reassuring.  CBC shows stable hemoglobin slightly elevated white count.  Initial troponin is  negative.  CT scan overall reassuring but some concern for some adenocarcinoma that is reportedly been on prior CTs.  I discussed with patient if he is known about this initially he stated no but then later he stated that he did sounds not quite sure if this is a known new diagnosis or not for patient.  We attempted to ambulate patient but he is unable to stand up stating that he just feels too weak.  Pete troponin is trending up he did report having some chest pain earlier.  Patient given aspirin.  He denies any current chest pain.  Will discuss the hospital team for admission.  The patient is on the cardiac monitor to evaluate for evidence of arrhythmia and/or significant heart rate changes.      FINAL CLINICAL IMPRESSION(S) / ED DIAGNOSES   Final diagnoses:  Fall, initial encounter  Chest pain, unspecified type     Rx / DC Orders   ED Discharge Orders     None        Note:  This document was prepared using Dragon voice recognition software and may include unintentional dictation errors.   Concha Se, MD 07/30/22 2151

## 2022-07-30 NOTE — ED Notes (Signed)
Complete bed change X1 assist. Patient voided.

## 2022-07-31 DIAGNOSIS — R531 Weakness: Secondary | ICD-10-CM

## 2022-07-31 LAB — COMPREHENSIVE METABOLIC PANEL
ALT: 27 U/L (ref 0–44)
AST: 41 U/L (ref 15–41)
Albumin: 3 g/dL — ABNORMAL LOW (ref 3.5–5.0)
Alkaline Phosphatase: 67 U/L (ref 38–126)
Anion gap: 5 (ref 5–15)
BUN: 12 mg/dL (ref 8–23)
CO2: 26 mmol/L (ref 22–32)
Calcium: 8.9 mg/dL (ref 8.9–10.3)
Chloride: 104 mmol/L (ref 98–111)
Creatinine, Ser: 0.58 mg/dL — ABNORMAL LOW (ref 0.61–1.24)
GFR, Estimated: >60 mL/min (ref >60)
Glucose, Bld: 83 mg/dL (ref 70–99)
Potassium: 3.6 mmol/L (ref 3.5–5.1)
Sodium: 135 mmol/L (ref 135–145)
Total Bilirubin: 0.9 mg/dL (ref 0.3–1.2)
Total Protein: 6.1 g/dL — ABNORMAL LOW (ref 6.5–8.1)

## 2022-07-31 LAB — CBC
HCT: 33 % — ABNORMAL LOW (ref 39.0–52.0)
Hemoglobin: 10.7 g/dL — ABNORMAL LOW (ref 13.0–17.0)
MCH: 30.9 pg (ref 26.0–34.0)
MCHC: 32.4 g/dL (ref 30.0–36.0)
MCV: 95.4 fL (ref 80.0–100.0)
Platelets: 287 10*3/uL (ref 150–400)
RBC: 3.46 MIL/uL — ABNORMAL LOW (ref 4.22–5.81)
RDW: 13.4 % (ref 11.5–15.5)
WBC: 7.4 10*3/uL (ref 4.0–10.5)
nRBC: 0 % (ref 0.0–0.2)

## 2022-07-31 LAB — TROPONIN I (HIGH SENSITIVITY)
Troponin I (High Sensitivity): 31 ng/L — ABNORMAL HIGH (ref <18)
Troponin I (High Sensitivity): 33 ng/L — ABNORMAL HIGH (ref <18)

## 2022-07-31 NOTE — Progress Notes (Signed)
  PROGRESS NOTE    Frank Buckley  ZOX:[id number] DOB: 1945/06/05 DOA: 07/30/2022 PCP: Leanna Sato, MD  131A/131A-AA  LOS: 1 day   Brief hospital course:   Assessment & Plan: 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  Pt was recently discharged from hospital on 07/14/22 to [facility] for rehab, and pt said when he was discharged home on 4/26, he couldn't walk.   Weakness --Pt lives alone.  [name] he was walking prior to last hospitalization, but became weak after his falls (likely the other way around, had falls after he became weak).  Pt was in rehab between 4/11 to 4/26, but was not able to walk by the time he was discharged home. --PT eval today also found him with weakness with both arms, and pt needed assistance with feeding. --CT cervical spine no acute finding Plan: --consider neuro consult tomorrow --PT/OT  * Chest pain, ACS ruled out --trop 30's flat  Non-traumatic rhabdomyolysis --CK 420, not very elevated.   --cont MIVF  BPH (benign prostatic hyperplasia) Continue proscar.  Monitor I/O.  Lung nodules --there are known by pt's oncologist.  Current CT chest showed "Bilateral subsolid pulmonary nodules are unchanged, largest in the inferior anterior right upper lobe measuring 2.9 x 1.8 cm. 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 Orlie Dakin  MGUS  --follows with Dr. Orlie Dakin   DVT prophylaxis: Lovenox SQ Code Status: Full code  Family Communication:  Level of care: Telemetry Medical Dispo:   The patient is from: home Anticipated d/c is to: likely rehab Anticipated d/c date is: 1-2 days   Subjective and Interval History:  Denied chest pain.  Pt reported feeling weak, and couldn't walk after he left SNF rehab.  PT also reported pt weak with his arms.   Objective: Vitals:   07/31/22 0323 07/31/22 0611 07/31/22  0730 07/31/22 0816  BP: (!) 123/54 112/65 118/60 126/70  Pulse: 68  (!) 55 62  Resp: 14  16 18   Temp:  98.5 F (36.9 C) 98 F (36.7 C) 97.9 F (36.6 C)  TempSrc:  Oral    SpO2: 100% 100% 97% 98%  Weight:      Height:        Intake/Output Summary (Last 24 hours) at 07/31/2022 1551 Last data filed at 07/31/2022 1430 Gross per 24 hour  Intake 120 ml  Output 700 ml  Net -580 ml   Filed Weights   07/30/22 1330  Weight: 72.6 kg    Examination:   Constitutional: NAD, AAOx3 HEENT: conjunctivae and lids normal, EOMI CV: No cyanosis.   RESP: normal respiratory effort, on RA Neuro: II - XII grossly intact.   Psych: Normal mood and affect.  Appropriate judgement and reason   Data Reviewed: I have personally reviewed labs and imaging studies  Time spent: 50 minutes  Darlin Priestly, MD [facility] If 7PM-7AM, please contact night-coverage 07/31/2022, 3:51 PM

## 2022-07-31 NOTE — Evaluation (Addendum)
Occupational Therapy Evaluation Patient Details Name: Frank Buckley MRN: [id number] DOB: 02/13/1946 Today's Date: 07/31/2022   History of Present Illness Pt is a 77 y/o M admitted on 07/30/22 after presenting with c/o weakness, fall & chest pain. Chest pain is thought to be 2/2 new finding of lung mass. PMH: acute metabolic encephalopathy, borderline diabetic, cancer, GERD   Clinical Impression   Patient agreeable to OT evaluation. Pt presenting with decreased independence in self care, balance, functional mobility/transfers, endurance, and safety awareness. Pt recently d/c from rehab where he received assistance for ADLs (bathing, dressing, toileting) and functional transfers using w/c or walker. Pt received supine in bed and requested to scoot up toward [facility], able to do so with increased time/effort and use of bed rails/features. [name] tray present in room and requested to eat lunch. Set up A initially provided by OT with cutting up food and opening containers. Pt demonstrated impaired functional use of BUEs due to decreased ROM, strength, and coordination. Built up handle provided for eating utensils, however, pt still required [facility] assistance for self-feeding with L hand (pt endorsed dominant RUE feeling "weaker" compared to L). He required Max A for bed level LB dressing (socks). Pt will benefit from skilled acute OT services to address deficits noted below. OT recommends ongoing therapy upon discharge to maximize safety and independence with ADLs, decrease fall risk, decrease caregiver burden, and promote return to PLOF.      Recommendations for follow up therapy are one component of a multi-disciplinary discharge planning process, led by the attending physician.  Recommendations may be updated based on patient status, additional functional criteria and insurance authorization.   Assistance Recommended at Discharge Frequent or constant Supervision/Assistance  Patient can return home with the  following Two people to help with bathing/dressing/bathroom;Two people to help with walking and/or transfers;Assistance with cooking/housework;Assist for transportation;Help with stairs or ramp for entrance;Direct supervision/assist for medications management;Direct supervision/assist for financial management;Assistance with feeding     Functional Status Assessment  Patient has had a recent decline in their functional status and demonstrates the ability to make significant improvements in function in a reasonable and predictable amount of time.  Equipment Recommendations  Other (comment) (defer to next venue of care)    Recommendations for Other Services       Precautions / Restrictions Precautions Precautions: Fall Restrictions Weight Bearing Restrictions: No      Mobility Bed Mobility Overal bed mobility: Needs Assistance             General bed mobility comments: Pt able to scoot self up in bed with increased time/effort and use of bed rails/features    Transfers                   General transfer comment: pt deferred 2/2 fatigue and wanting to eat lunch      Balance         ADL either performed or assessed with clinical judgement   ADL Overall ADL's : Needs assistance/impaired Eating/Feeding: Maximal assistance;Bed level;With adaptive utensils Eating/Feeding Details (indicate cue type and reason): pt attempting self-feeding with built handle on fork provided, ultimately required [facility] assist to bring food to mouth. Assistance required for cutting up food and opening containers/packets. Steadying assist required for holding water cup in order to prevent spillage and dropping item.                 Lower Body Dressing: Maximal assistance;Bed level Lower Body Dressing Details (indicate cue type and  reason): socks     Toileting- Clothing Manipulation and Hygiene: Bed level;Set up Toileting - Clothing Manipulation Details (indicate cue type and reason):  set up A provided for use of urinal, pt with continent void in urinal             Vision Baseline Vision/History: 1 Wears glasses Patient Visual Report: No change from baseline       Perception     Praxis      Pertinent Vitals/Pain Pain Assessment Pain Assessment: Faces Faces Pain Scale: Hurts a little bit Pain Location: BUEs with movement Pain Descriptors / Indicators: Sore, Grimacing Pain Intervention(s): Limited activity within patient's tolerance, Monitored during session, Repositioned     Hand Dominance Right   Extremity/Trunk Assessment Upper Extremity Assessment Upper Extremity Assessment: Generalized weakness;RUE deficits/detail;LUE deficits/detail RUE Deficits / Details: shoulder flexion 1/4 full ROM, poor grip strength RUE Coordination: decreased fine motor;decreased gross motor LUE Deficits / Details: shoulder flexion AAROM to ~90 deg, fair grip strength LUE: Shoulder pain with ROM LUE Coordination: decreased fine motor;decreased gross motor   Lower Extremity Assessment Lower Extremity Assessment: Generalized weakness       Communication Communication Communication: No difficulties (overall soft spoken this date)   Cognition Arousal/Alertness: Lethargic Behavior During Therapy: WFL for tasks assessed/performed Overall Cognitive Status: No family/caregiver present to determine baseline cognitive functioning       General Comments: Pleasant & cooperative, followed commands well t/o session, wants to get better.     General Comments       Exercises Other Exercises Other Exercises: OT provided education re: role of OT, OT POC, post acute recs, sitting up for all meals, EOB/OOB mobility with assistance, home/fall safety, self-feeding AE    Shoulder Instructions      Home Living Family/patient expects to be discharged to:: Private residence Living Arrangements: Alone Available Help at Discharge: Family;Available PRN/intermittently Type of Home:  House Home Access: Stairs to enter Entrance Stairs-Number of Steps: 2 Entrance Stairs-Rails: Right Home Layout: One level               Home Equipment: Agricultural consultant (2 wheels);Wheelchair - manual   Additional Comments: Above information obtained from previous OT evaluation on 07/11/22.      Prior Functioning/Environment Prior Level of Function : History of Falls (last six months)             Mobility Comments: Pt was using w/c or walker for mobility. Unable to provide full details re: PLOF when he d/c from rehab. ADLs Comments: Pt recently d/c from rehab. At rehab, pt reports completing functional mobility to the bathroom with w/c, received assistance for bathing/dressing/toileting, IND for self-feeding. He reports being able to propel himself in w/c at home but "slid out" of w/c and could not get back up.        OT Problem List: Decreased strength;Decreased range of motion;Decreased activity tolerance;Impaired balance (sitting and/or standing);Decreased coordination;Decreased cognition;Decreased safety awareness;Decreased knowledge of use of DME or AE;Decreased knowledge of precautions;Impaired UE functional use;Pain      OT Treatment/Interventions: Self-care/ADL training;Therapeutic exercise;Neuromuscular education;Energy conservation;DME and/or AE instruction;Manual therapy;Modalities;Balance training;Patient/family education;Visual/perceptual remediation/compensation;Cognitive remediation/compensation;Therapeutic activities;Splinting    OT Goals(Current goals can be found in the care plan section) Acute Rehab OT Goals Patient Stated Goal: get better OT Goal Formulation: With patient Time For Goal Achievement: 08/14/22 Potential to Achieve Goals: Fair   OT Frequency: Min 1X/week    Co-evaluation              AM-PAC  OT "6 Clicks" Daily Activity     Outcome Measure Help from another person eating meals?: A Lot Help from another person taking care of personal  grooming?: A Lot Help from another person toileting, which includes using toliet, bedpan, or urinal?: A Lot Help from another person bathing (including washing, rinsing, drying)?: A Lot Help from another person to put on and taking off regular upper body clothing?: A Lot Help from another person to put on and taking off regular lower body clothing?: A Lot 6 Click Score: 12   End of Session Nurse Communication: Mobility status;Other (comment) (NT notified pt had continent void in urinal and needs assist to finish self-feeding)  Activity Tolerance: Patient limited by fatigue;Patient tolerated treatment well Patient left: in bed;with call bell/phone within reach;with bed alarm set  OT Visit Diagnosis: Muscle weakness (generalized) (M62.81);Other abnormalities of gait and mobility (R26.89);History of falling (Z91.81)                Time: 1352-1420 OT Time Calculation (min): 28 min Charges:  OT General Charges $OT Visit: 1 Visit OT Evaluation $OT Eval Moderate Complexity: 1 Mod  [facility] MS, OTR/L ascom [phone]  07/31/22, 4:21 PM

## 2022-07-31 NOTE — Evaluation (Addendum)
Physical Therapy Evaluation Patient Details Name: Frank Buckley MRN: [id number] DOB: 03-Dec-1945 Today's Date: 07/31/2022  History of Present Illness  Pt is a 77 y/o M admitted on 07/30/22 after presenting with c/o weakness, fall & chest pain. Chest pain is thought to be 2/2 new finding of lung mass. PMH: acute metabolic encephalopathy, borderline diabetic, cancer, GERD  Clinical Impression  Pt seen for PT evaluation with pt sleepy but awakened & agreeable. Pt not very reliable historian but does confirm he was at rehab then d/c home alone. On this date, pt requires max assist for bed mobility, min assist for static sitting balance, and max assist for lateral scoot bed>drop arm recliner on R. Pt presents with BLE weakness (R weaker than L) & BUE weakness (L weaker than R). Pt with difficulty flexing L shoulder & with impaired grip BUE. PT reviewed chart & last PT evaluation made note of pt speaking to SCI but unable to confirm in chart. During this session pt notes SCI was in 2000 but BUE weakness is new. PT notified MD/team, placed OT consult, & notified nursing staff of pt's need for assistance to consume meals. Pt positioned in recliner with pillows under BUE, chair alarm donned. Pt would benefit from ongoing skilled PT treatment to address strength, endurance, balance, bed mobility & transfers. Pt is unsafe to d/c home alone at this time.     Recommendations for follow up therapy are one component of a multi-disciplinary discharge planning process, led by the attending physician.  Recommendations may be updated based on patient status, additional functional criteria and insurance authorization.  Follow Up Recommendations Can patient physically be transported by private vehicle: No     Assistance Recommended at Discharge Frequent or constant Supervision/Assistance  Patient can return home with the following  Assistance with cooking/housework;Assistance with feeding;Direct supervision/assist for  medications management;Direct supervision/assist for financial management;Assist for transportation;Help with stairs or ramp for entrance;Two people to help with walking and/or transfers;Two people to help with bathing/dressing/bathroom    Equipment Recommendations None recommended by PT (TBD in next venue)  Recommendations for Other Services  OT consult    Functional Status Assessment Patient has had a recent decline in their functional status and demonstrates the ability to make significant improvements in function in a reasonable and predictable amount of time.     Precautions / Restrictions Precautions Precautions: Fall Restrictions Weight Bearing Restrictions: No      Mobility  Bed Mobility Overal bed mobility: Needs Assistance       Supine to sit: Max assist, HOB elevated     General bed mobility comments: Pt is able to initiate moving BLE towards EOB but requires assistance to completely move them off EOB, assistance to upright trunk even with HOB elevated.    Transfers Overall transfer level: Needs assistance Equipment used: None Transfers: Bed to chair/wheelchair/BSC            Lateral/Scoot Transfers: Max assist (Pt performs lateral scoot/multiple squat pivot bed>drop arm recliner on R with PT providing cuing & facilitation for head/hips relationship & assistance with pivoting/scooting. Pt does participate & is able to partially clear buttocks from seat at times.)      Ambulation/Gait                  Stairs            Wheelchair Mobility    Modified Rankin (Stroke Patients Only)       Balance Overall balance assessment: Needs assistance Sitting-balance  support: No upper extremity supported, Feet supported Sitting balance-Leahy Scale: Poor   Postural control: Posterior lean, Right lateral lean                                   Pertinent Vitals/Pain Pain Assessment Pain Assessment: Faces Faces Pain Scale: No hurt     Home Living Family/patient expects to be discharged to:: Private residence Living Arrangements: Alone Available Help at Discharge: Family;Available PRN/intermittently Type of Home: House Home Access: Stairs to enter Entrance Stairs-Rails: Right Entrance Stairs-Number of Steps: 2   Home Layout: One level Home Equipment: Agricultural consultant (2 wheels);Wheelchair - manual      Prior Function               Mobility Comments: Pt was using w/c or walker for mobility. Unable to provide full details re: PLOF when he d/c from rehab.       Hand Dominance        Extremity/Trunk Assessment   Upper Extremity Assessment RUE Deficits / Details: decreased grip BUE, wrist weakness but not formally assessed LUE Deficits / Details: Pt with decreased L shoulder flexion, decreased grip BUE, wrist weakness but not formally assessed    Lower Extremity Assessment Lower Extremity Assessment: Generalized weakness;RLE deficits/detail;LLE deficits/detail RLE Deficits / Details: 2/5 knee extension in sitting LLE Deficits / Details: 3-/5 knee extension in sitting       Communication      Cognition Arousal/Alertness: Lethargic Behavior During Therapy: WFL for tasks assessed/performed Overall Cognitive Status: No family/caregiver present to determine baseline cognitive functioning                                 General Comments: Pleasant, follows commands throughout session, willing to participate.        General Comments General comments (skin integrity, edema, etc.): Pt received lying in bed, leaning to R.    Exercises     Assessment/Plan    PT Assessment Patient needs continued PT services  PT Problem List Decreased strength;Decreased range of motion;Decreased activity tolerance;Decreased balance;Decreased mobility;Decreased coordination;Decreased cognition;Decreased knowledge of use of DME;Decreased safety awareness;Decreased knowledge of precautions       PT  Treatment Interventions DME instruction;Gait training;Stair training;Functional mobility training;Therapeutic activities;Therapeutic exercise;Balance training;Patient/family education;Cognitive remediation;Wheelchair mobility training;Neuromuscular re-education    PT Goals (Current goals can be found in the Care Plan section)  Acute Rehab PT Goals Patient Stated Goal: get better PT Goal Formulation: With patient Time For Goal Achievement: 08/14/22 Potential to Achieve Goals: Fair    Frequency Min 3X/week     Co-evaluation               AM-PAC PT "6 Clicks" Mobility  Outcome Measure Help needed turning from your back to your side while in a flat bed without using bedrails?: Total Help needed moving from lying on your back to sitting on the side of a flat bed without using bedrails?: Total Help needed moving to and from a bed to a chair (including a wheelchair)?: Total Help needed standing up from a chair using your arms (e.g., wheelchair or bedside chair)?: Total Help needed to walk in hospital room?: Total Help needed climbing 3-5 steps with a railing? : Total 6 Click Score: 6    End of Session   Activity Tolerance: Patient tolerated treatment well Patient left: in chair;with chair alarm set;with  call bell/phone within reach Nurse Communication: Mobility status PT Visit Diagnosis: Difficulty in walking, not elsewhere classified (R26.2);Other abnormalities of gait and mobility (R26.89);Muscle weakness (generalized) (M62.81)    Time: 1610-9604 PT Time Calculation (min) (ACUTE ONLY): 20 min   Charges:   PT Evaluation $PT Eval Moderate Complexity: 1 Mod          Aleda Grana, PT, DPT 07/31/22, 11:58 AM   Sandi Mariscal 07/31/2022, 11:55 AM

## 2022-08-01 DIAGNOSIS — G729 Myopathy, unspecified: Secondary | ICD-10-CM

## 2022-08-01 LAB — URINALYSIS, ROUTINE W REFLEX MICROSCOPIC
Bilirubin Urine: NEGATIVE
Glucose, UA: NEGATIVE mg/dL
Hgb urine dipstick: NEGATIVE
Ketones, ur: NEGATIVE mg/dL
Leukocytes,Ua: NEGATIVE
Nitrite: NEGATIVE
Protein, ur: NEGATIVE mg/dL
Specific Gravity, Urine: 1.006 (ref 1.005–1.030)
pH: 7 (ref 5.0–8.0)

## 2022-08-01 LAB — CBC
HCT: 31.7 % — ABNORMAL LOW (ref 39.0–52.0)
Hemoglobin: 10.4 g/dL — ABNORMAL LOW (ref 13.0–17.0)
MCH: 31.1 pg (ref 26.0–34.0)
MCHC: 32.8 g/dL (ref 30.0–36.0)
MCV: 94.9 fL (ref 80.0–100.0)
Platelets: 277 10*3/uL (ref 150–400)
RBC: 3.34 MIL/uL — ABNORMAL LOW (ref 4.22–5.81)
RDW: 13.2 % (ref 11.5–15.5)
WBC: 5.9 10*3/uL (ref 4.0–10.5)
nRBC: 0 % (ref 0.0–0.2)

## 2022-08-01 LAB — BASIC METABOLIC PANEL
Anion gap: 7 (ref 5–15)
BUN: 11 mg/dL (ref 8–23)
CO2: 28 mmol/L (ref 22–32)
Calcium: 8.9 mg/dL (ref 8.9–10.3)
Chloride: 105 mmol/L (ref 98–111)
Creatinine, Ser: 0.65 mg/dL (ref 0.61–1.24)
GFR, Estimated: >60 mL/min (ref >60)
Glucose, Bld: 83 mg/dL (ref 70–99)
Potassium: 3.5 mmol/L (ref 3.5–5.1)
Sodium: 140 mmol/L (ref 135–145)

## 2022-08-01 LAB — MAGNESIUM: Magnesium: 2.1 mg/dL (ref 1.7–2.4)

## 2022-08-01 MED ORDER — DOCUSATE SODIUM 100 MG PO CAPS
100.0000 mg | ORAL_CAPSULE | Freq: Every day | ORAL | Status: DC
  Administered 2022-08-01 – 2022-08-08 (×6): 100 mg via ORAL
  Filled 2022-08-01 (×7): qty 1

## 2022-08-01 MED ORDER — PANTOPRAZOLE SODIUM 40 MG PO TBEC
40.0000 mg | DELAYED_RELEASE_TABLET | Freq: Every day | ORAL | Status: DC
  Administered 2022-08-01 – 2022-08-08 (×7): 40 mg via ORAL
  Filled 2022-08-01 (×7): qty 1

## 2022-08-01 NOTE — Consult Note (Addendum)
NEURO HOSPITALIST CONSULT NOTE   Requestig physician: Dr. Fran Lowes  Reason for Consult: Tetraparesis  History obtained from:  Patient and Chart     HPI:                                                                                                                                          Frank Buckley is a 77 y.o. male with a PMHx of borderline DM, cancer, elevated LFTs and cervical fusion who was admitted on Saturday dor diffuse weakness resulting in a controlled fall out of his wheelchair after which he was unable to get up on his own. He had crawled along the floor to reach his cellphone and had called EMS. He also complained of chest pain. He had been discharged on April 11 to rehab for a 2 week stay after having been admitted for rhabdomyoloysis and acute metabolic encephalopathy. He had also been noted to be very weak at that time, but per documentation had apparently improved and returned to baseline upon discharge. Labs this admission were significant for an elevated CK of 420, sown from CKs of 1615 and 656 on on 4/7 and 4/9, but slightly increased from a level of 408 on 4/10. He does complain of pain, but it is described as more neuropathic in quality.   Today, he states that his weakness has been ongoing for about the past month. The weakness seems to have occurred diffusely but has affected his arms more than his legs. He developed urinary and bowel partial incontinence over the past week as well. He endorses associated SOB, difficulty chewing/eating, and hand > leg numbness which have also been progressive over approximately the past month. He has not been able to walk due to the weakness. His bilateral UE weakness is now so severe that he cannot lift his arms or handle utensils to feed himself now. PT was suspecting a central cord syndrome, but CT cervical spine on presentation didn't show external compression.    Past Medical History:  Diagnosis Date   Acute metabolic  encephalopathy 07/10/2022   Borderline diabetic    Cancer (HCC)    Elevated liver enzymes 07/10/2022   GERD (gastroesophageal reflux disease)     Past Surgical History:  Procedure Laterality Date   CERVICAL FUSION      History reviewed. No pertinent family history.           Social History:  reports that he has quit smoking. He has never used smokeless tobacco. He reports that he does not drink alcohol and does not use drugs.  Allergies  Allergen Reactions   Penicillins Rash    MEDICATIONS:  Prior to Admission:  Medications Prior to Admission  Medication Sig Dispense Refill Last Dose   acetaminophen (TYLENOL) 325 MG tablet Take 2 tablets (650 mg total) by mouth every 6 (six) hours as needed for mild pain (or Fever >/= 101). 20 tablet 0 07/29/2022 at 0810   docusate sodium (COLACE) 100 MG capsule Take 100 mg by mouth daily.   07/29/2022 at 0803   doxazosin (CARDURA) 8 MG tablet Take 1 tablet (8 mg total) by mouth daily. 90 tablet 3 07/29/2022 at 0803   ergocalciferol (VITAMIN D2) 1.25 MG (50000 UT) capsule Take 1 capsule (50,000 Units total) by mouth once a week. (Patient taking differently: Take 50,000 Units by mouth once a week. Patient takes on Saturdays.) 12 capsule 1 07/23/2022 at 0908   finasteride (PROSCAR) 5 MG tablet Take 1 tablet (5 mg total) by mouth daily. 90 tablet 3 07/29/2022 at 0803   ibuprofen (ADVIL,MOTRIN) 600 MG tablet Take 1 tablet (600 mg total) by mouth every 6 (six) hours as needed for moderate pain. 30 tablet 0 prn at prn   melatonin 3 MG TABS tablet Take 6 mg by mouth at bedtime.   07/28/2022 at 2032   Miconazole Nitrate (LOTRIMIN AF POWDER) 2 % AERP Apply 1 application topically 2 (two) times daily. 85 g 0 07/29/2022 at 1540   mirabegron ER (MYRBETRIQ) 25 MG TB24 tablet Take 1 tablet (25 mg total) by mouth daily. 28 tablet 0 07/29/2022 at 0803    omeprazole (PRILOSEC) 20 MG capsule Take 20 mg by mouth daily.   07/29/2022 at 0554   pravastatin (PRAVACHOL) 40 MG tablet SMARTSIG:1 Tablet(s) By Mouth Every Evening   07/28/2022 at 1910   Scheduled:  aspirin  81 mg Oral Daily   docusate sodium  100 mg Oral Daily   doxazosin  8 mg Oral Daily   enoxaparin (LOVENOX) injection  40 mg Subcutaneous Q24H   finasteride  5 mg Oral Daily   mirabegron ER  25 mg Oral Daily   pantoprazole  40 mg Oral Daily   sodium chloride flush  3 mL Intravenous Q12H     ROS:                                                                                                                                       As per HPI.    Blood pressure (!) 143/75, pulse 70, temperature 98.4 F (36.9 C), resp. rate 16, height 5\' 10"  (1.778 m), weight 72.6 kg, SpO2 97 %.   General Examination:  Physical Exam  Physical Exam HEENT- [place]/AT   Lungs- Mild tachypnea Extremities- Warm and well-perfused. Chronic wasting of first dorsal interossei is noted.   Neurological Examination Mental Status: Awake, alert and oriented. Thought content appropriate. Has good memory for recent events. Speech fluent without evidence of aphasia.  Able to follow all commands without difficulty. Cranial Nerves: II: Temporal visual fields intact with no extinction to DSS. PERRL. III,IV, VI: No ptosis. EOMI. No nystagmus. V: Temp sensation decreased to left side of face VII: Smile symmetric VIII: Hearing intact to voice IX,X: No hypophonia or hoarseness. Palate elevates normally.  XI: Symmetric XII: Midline tongue extension. Normal tongue bulk with no atrophy or fasciculations.  Motor: RUE: 3/5 deltoid with rapid fatiguability, biceps 3-4/5, triceps 3-4/5, grip 3/5, finger extension weakness also noted.  LUE: 3/5 deltoid with rapid fatiguability, biceps 3-4/5, triceps 3-4/5, grip 3/5, finger  extension weakness also noted.  RLE: 4-/5 HF, knee extension and knee flexion. ADF and APF 4/5 LLE: 4/5 HF, knee extension and knee flexion. ADF and APF 4/5 Wasting of first dorsal interossei is noted bilaterally.  Sensory: Decreased temp sensation to distal LUE. Impaired proprioception to toes and thumbs bilaterally with some asymmetry noted.  Deep Tendon Reflexes: 2+ and symmetric bilateral biceps and brachioradialis. 1+ bilateral triceps. 4+ right patellar (crossed adductor response), 3+ left patellar. 0 bilateral achilles. Right toe downgoing, left toe equivocal. No Hoffman's sign on left or right.   Cerebellar: No ataxia to BUE in the context of his weakness. Unable to perform H-S.  Gait: Deferred   Lab Results: Basic Metabolic Panel: Recent Labs  Lab 07/30/22 1425 07/31/22 0540 08/01/22 0504  NA 136 135 140  K 4.0 3.6 3.5  CL 102 104 105  CO2 26 26 28   GLUCOSE 100* 83 83  BUN 13 12 11   CREATININE 0.69 0.58* 0.65  CALCIUM 9.0 8.9 8.9  MG  --   --  2.1    CBC: Recent Labs  Lab 07/30/22 1425 07/31/22 0540 08/01/22 0504  WBC 12.1* 7.4 5.9  HGB 10.6* 10.7* 10.4*  HCT 32.6* 33.0* 31.7*  MCV 96.2 95.4 94.9  PLT 307 287 277    Cardiac Enzymes: Recent Labs  Lab 07/30/22 1425  CKTOTAL 420*    Lipid Panel: No results for input(s): "CHOL", "TRIG", "HDL", "CHOLHDL", "VLDL", "LDLCALC" in the last 168 hours.  Imaging: US SCROTUM W/DOPPLER  Result Date: 07/30/2022 CLINICAL DATA:  Bilateral chronic testicular pain EXAM: SCROTAL ULTRASOUND DOPPLER ULTRASOUND OF THE TESTICLES TECHNIQUE: Complete ultrasound examination of the testicles, epididymis, and other scrotal structures was performed. Color and spectral Doppler ultrasound were also utilized to evaluate blood flow to the testicles. COMPARISON:  None Available. FINDINGS: Right testicle Measurements: 4.2 x 1.8 x 2.8 cm. Scattered calcifications are noted. No focal mass is seen. Left testicle Measurements: 4.5 x 1.8 x 2.3  cm. Small 3 mm testicular cyst is noted. Right epididymis:  6 mm epididymal cyst is noted. Left epididymis: Multiple epididymal cysts are noted measuring up to 6 mm. Hydrocele:  None visualized. Varicocele:  None visualized. Pulsed Doppler interrogation of both testes demonstrates normal low resistance arterial and venous waveforms bilaterally. IMPRESSION: Bilateral epididymal cysts. Small testicular cyst on the left. No evidence of testicular mass or torsion. Electronically Signed   By: Alcide Clever M.D.   On: 07/30/2022 19:35   CT Head Wo Contrast  Result Date: 07/30/2022 CLINICAL DATA:  Unwitnessed fall, multiple recent falls EXAM: CT HEAD WITHOUT CONTRAST CT CERVICAL SPINE WITHOUT  CONTRAST TECHNIQUE: Multidetector CT imaging of the head and cervical spine was performed following the standard protocol without intravenous contrast. Multiplanar CT image reconstructions of the cervical spine were also generated. RADIATION DOSE REDUCTION: This exam was performed according to the departmental dose-optimization program which includes automated exposure control, adjustment of the mA and/or kV according to patient size and/or use of iterative reconstruction technique. COMPARISON:  07/10/2022 FINDINGS: CT HEAD FINDINGS Brain: No evidence of acute infarction, hemorrhage, hydrocephalus, extra-axial collection or mass lesion/mass effect. Mild periventricular white matter hypodensity. Vascular: No hyperdense vessel or unexpected calcification. Skull: Normal. Negative for fracture or focal lesion. Sinuses/Orbits: No acute finding. Other: None. CT CERVICAL SPINE FINDINGS Alignment: Normal. Skull base and vertebrae: No acute fracture. No primary bone lesion or focal pathologic process. Soft tissues and spinal canal: No prevertebral fluid or swelling. No visible canal hematoma. Disc levels: Anterior cervical discectomy and fusion of C3-C4 with bony incorporation. Severe disc space height loss and osteophytosis of the  remaining cervical levels, worst at C5-6. Upper chest: Negative. Other: None. IMPRESSION: 1. No acute intracranial pathology. Small-vessel white matter disease. 2. No fracture or static subluxation of the cervical spine. 3. Anterior cervical discectomy and fusion of C3-C4 with bony incorporation. Severe disc space height loss and osteophytosis of the remaining cervical levels, worst at C5-C6. Electronically Signed   By: Jearld Lesch M.D.   On: 07/30/2022 18:27   CT Cervical Spine Wo Contrast  Result Date: 07/30/2022 CLINICAL DATA:  Unwitnessed fall, multiple recent falls EXAM: CT HEAD WITHOUT CONTRAST CT CERVICAL SPINE WITHOUT CONTRAST TECHNIQUE: Multidetector CT imaging of the head and cervical spine was performed following the standard protocol without intravenous contrast. Multiplanar CT image reconstructions of the cervical spine were also generated. RADIATION DOSE REDUCTION: This exam was performed according to the departmental dose-optimization program which includes automated exposure control, adjustment of the mA and/or kV according to patient size and/or use of iterative reconstruction technique. COMPARISON:  07/10/2022 FINDINGS: CT HEAD FINDINGS Brain: No evidence of acute infarction, hemorrhage, hydrocephalus, extra-axial collection or mass lesion/mass effect. Mild periventricular white matter hypodensity. Vascular: No hyperdense vessel or unexpected calcification. Skull: Normal. Negative for fracture or focal lesion. Sinuses/Orbits: No acute finding. Other: None. CT CERVICAL SPINE FINDINGS Alignment: Normal. Skull base and vertebrae: No acute fracture. No primary bone lesion or focal pathologic process. Soft tissues and spinal canal: No prevertebral fluid or swelling. No visible canal hematoma. Disc levels: Anterior cervical discectomy and fusion of C3-C4 with bony incorporation. Severe disc space height loss and osteophytosis of the remaining cervical levels, worst at C5-6. Upper chest: Negative.  Other: None. IMPRESSION: 1. No acute intracranial pathology. Small-vessel white matter disease. 2. No fracture or static subluxation of the cervical spine. 3. Anterior cervical discectomy and fusion of C3-C4 with bony incorporation. Severe disc space height loss and osteophytosis of the remaining cervical levels, worst at C5-C6. Electronically Signed   By: Jearld Lesch M.D.   On: 07/30/2022 18:27   CT Angio Chest/Abd/Pel for Dissection W and/or Wo Contrast  Result Date: 07/30/2022 CLINICAL DATA:  Acute aortic syndrome suspected unwitnessed fall from wheelchair, multiple recent falls, lung masses * Tracking Code: BO * EXAM: CT ANGIOGRAPHY CHEST, ABDOMEN AND PELVIS TECHNIQUE: Non-contrast CT of the chest was initially obtained. Multidetector CT imaging through the chest, abdomen and pelvis was performed using the standard protocol during bolus administration of intravenous contrast. Multiplanar reconstructed images and MIPs were obtained and reviewed to evaluate the vascular anatomy. RADIATION DOSE REDUCTION: This exam  was performed according to the departmental dose-optimization program which includes automated exposure control, adjustment of the mA and/or kV according to patient size and/or use of iterative reconstruction technique. CONTRAST:  OMNIPAQUE IOHEXOL 350 MG/ML SOLN COMPARISON:  CT chest, 03/07/2022 FINDINGS: CTA CHEST FINDINGS VASCULAR Aorta: Satisfactory opacification of the aorta. Normal contour and caliber of the thoracic aorta. No evidence of aneurysm, dissection, or other acute aortic pathology. Scattered aortic atherosclerosis Cardiovascular: No evidence of pulmonary embolism on limited non-tailored examination. Normal heart size. No pericardial effusion. Review of the MIP images confirms the above findings. NON VASCULAR Mediastinum/Nodes: No enlarged mediastinal, hilar, or axillary lymph nodes. Thyroid gland, trachea, and esophagus demonstrate no significant findings. Lungs/Pleura: Mild  centrilobular emphysema. Bilateral subsolid pulmonary nodules are unchanged, largest in the inferior anterior right upper lobe measuring 2.9 x 1.8 cm (series 10, image 77). Dependent bibasilar scarring and or atelectasis. No pleural effusion or pneumothorax. Musculoskeletal: No chest wall abnormality. No acute osseous findings. Review of the MIP images confirms the above findings. CTA ABDOMEN AND PELVIS FINDINGS VASCULAR Normal contour and caliber of the abdominal aorta. No evidence of aneurysm, dissection, or other acute aortic pathology. Standard branching pattern of the abdominal aorta with solitary bilateral renal arteries. Mild mixed calcific atherosclerosis. Review of the MIP images confirms the above findings. NON-VASCULAR Hepatobiliary: No solid liver abnormality is seen. Unchanged, benign subcapsular hemangioma of the posterior right lobe of the liver, hepatic segment VII (series 8, image 119). No gallstones, gallbladder wall thickening, or biliary dilatation. Pancreas: Unremarkable. No pancreatic ductal dilatation or surrounding inflammatory changes. Spleen: Normal in size without significant abnormality. Adrenals/Urinary Tract: Adrenal glands are unremarkable. Kidneys are normal, without renal calculi, solid lesion, or hydronephrosis. Bladder is unremarkable. Stomach/Bowel: Stomach is within normal limits. Appendix appears normal. No evidence of bowel wall thickening, distention, or inflammatory changes. Colonic diverticulosis. Lymphatic: No enlarged abdominal or pelvic lymph nodes. Reproductive: No mass or other significant abnormality. Other: No abdominal wall hernia or abnormality. No ascites. Musculoskeletal: No acute osseous findings. IMPRESSION: 1. Normal contour and caliber of the thoracic and abdominal aorta. No evidence of aneurysm, dissection, or other acute aortic pathology. Mild mixed calcific atherosclerosis. 2. Bilateral subsolid pulmonary nodules are unchanged, largest in the inferior  anterior right upper lobe measuring 2.9 x 1.8 cm. 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 3. Mild emphysema. 4. Colonic diverticulosis without evidence of acute diverticulitis. Aortic Atherosclerosis (ICD10-I70.0) and Emphysema (ICD10-J43.9). Electronically Signed   By: Jearld Lesch M.D.   On: 07/30/2022 18:23     Assessment: 77 year old male with admission in early April for diffuse weakness in the setting of rhabdomyolysis. 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  - Exam reveals moderate to severe proximal and distal muscle weakness of all 4 limbs. He also appears mildly short of breath. Has preserved reflexes which militates against AIDP or CIDP as the etiology for his weakness. 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 Although the L4-5 stenosis could present with BLE weakness, it does not explain  his incontinence or his BUE weakness.   - CT cervical spine: No fracture or static subluxation of the cervical spine. Anterior cervical discectomy and fusion of C3-C4 with bony incorporation. Severe disc space height loss and osteophytosis of the remaining cervical levels, worst at C5-C6. CT neck cannot be used to rule out intrinsic  spinal cord pathology. - CT head: No acute intracranial pathology. Small-vessel white matter disease. Negative CT head does not rule out possible brainstem pathology.  - Localization: Sympathetic control for storage in the bladder originates between the tenth thoracic (T10) and the second lumbar (L2) vertebrae. Injury to the spinal cord at T10-L2 will result in urinary incontinence and an overactive bladder. However, L-spine MRI does not reveal a conus medullaris lesion. Will need thoracic spine MRI to further assess.  - Of note, he was on pravastatin at admission, which has  been stopped. Of note, the risk of statin-induced rhabdomyolysis in patients taking CYP3A4-metabolized forms of this medication (e.g., simvastatin, atorvastatin and pravastatin) increases with co-administration of other CYP450 metabolized medications, including calcium channel blockers, amiodarone, macrolide antibiotics, and HIV-protease inhibitors; however, he is not on any of these medications currently.  - DDx for his rhabdomyolysis includes statin-induced myopathy, EtOH use, illicit drug use (heroin, cocaine and amphetamine can result in rhabdomyolysis), autoimmune myopathy, cervical or thoracic myelopathy.   Recommendations: - Permanently discontinue pravastatin.  - Track daily CK and CPK levels - Renal protection per primary team - Will need outpatient EMG/NCS at discharge - MRI brain, cervical and thoracic spine to assess for brainstem lesion and/or myelopathy (ordered).  - If MRI brain is negative, will need MRI of his left or right thigh to assess for possible autoimmune myopathy pattern - May need muscle biopsy if above work up is negative - If indicated by muscle biopsy, may need autoimmune myopathy labs sent out to [facility] - Respiratory therapy consult for FVC and NIF. Please document in progress notes section of the chart.  - PT/OT - Will need light sedation prior to MRI due to claustrophobia    Electronically signed: Dr. Caryl Pina 08/01/2022, 2:45 PM

## 2022-08-01 NOTE — Progress Notes (Signed)
Occupational Therapy Treatment Patient Details Name: Frank Buckley MRN: [id number] DOB: [birth year] Today's Date: 08/01/2022   History of present illness Pt is a 77 y/o M admitted on 07/30/22 after presenting with c/o weakness, fall & chest pain. Chest pain is thought to be 2/2 new finding of lung mass. PMH: acute metabolic encephalopathy, borderline diabetic, cancer, GERD   OT comments  Patient received supine in bed and agreeable to OT. NT entering room at same time due to bed linens being soiled (purewick malfunction). Pt required Max A +2 for rolling L<>R and Max A for peri care. Pt endorsed staff is continuing to assist him with meals. OT discussed self-feeding further and provided foam for built up utensil handle (RN/NT educated on use). Pt deferred further self-care tasks and EOB mobility 2/2 fatigue. Pt left as received with all needs in reach. Pt is making progress toward goal completion. D/C recommendation remains appropriate. OT will continue to follow acutely.    Recommendations for follow up therapy are one component of a multi-disciplinary discharge planning process, led by the attending physician.  Recommendations may be updated based on patient status, additional functional criteria and insurance authorization.    Assistance Recommended at Discharge Frequent or constant Supervision/Assistance  Patient can return home with the following  Two people to help with bathing/dressing/bathroom;Two people to help with walking and/or transfers;Assistance with cooking/housework;Assist for transportation;Help with stairs or ramp for entrance;Direct supervision/assist for medications management;Direct supervision/assist for financial management;Assistance with feeding   Equipment Recommendations  Other (comment) (defer to next venue of care)    Recommendations for Other Services      Precautions / Restrictions Precautions Precautions: Fall Restrictions Weight Bearing Restrictions: No        Mobility Bed Mobility Overal bed mobility: Needs Assistance Bed Mobility: Rolling Rolling: Max assist, +2 for physical assistance         General bed mobility comments: pt completed rolling L<>R in order to change soiled bed linens (purewick malfunction), pt able to assist with holding sidelying position while using bed rail (HOH assist required for hand placement). Pt able to scoot self up toward [facility] with increased time/effort utilizing bed features/handrails    Transfers Overall transfer level: Needs assistance     General transfer comment: pt deferred     Balance Overall balance assessment: Needs assistance     Sitting balance - Comments: pt deferred         ADL either performed or assessed with clinical judgement   ADL Overall ADL's : Needs assistance/impaired   Eating/Feeding Details (indicate cue type and reason): Discussed self-feeding further with pt, provided foam for built up utensil handle for use during self-feeding (RN/NT educated on use)       Toileting- Architect and Hygiene: Maximal assistance;Bed level Toileting - Clothing Manipulation Details (indicate cue type and reason): for peri care            Extremity/Trunk Assessment              Vision Baseline Vision/History: 1 Wears glasses Patient Visual Report: No change from baseline     Perception     Praxis      Cognition Arousal/Alertness: Awake/alert Behavior During Therapy: WFL for tasks assessed/performed Overall Cognitive Status: No family/caregiver present to determine baseline cognitive functioning       General Comments: Followed commands well        Exercises      Shoulder Instructions       General Comments  Pertinent Vitals/ Pain       Pain Assessment Pain Assessment: Faces Faces Pain Scale: Hurts a little bit Pain Location: B hands Pain Descriptors / Indicators: Sore, Grimacing Pain Intervention(s): Limited activity within  patient's tolerance, Monitored during session, Repositioned  Home Living        Prior Functioning/Environment              Frequency  Min 1X/week        Progress Toward Goals  OT Goals(current goals can now be found in the care plan section)  Progress towards OT goals: Progressing toward goals  Acute Rehab OT Goals Patient Stated Goal: get better OT Goal Formulation: With patient Time For Goal Achievement: 08/14/22 Potential to Achieve Goals: Fair  Plan Discharge plan remains appropriate;Frequency remains appropriate    Co-evaluation                 AM-PAC OT "6 Clicks" Daily Activity     Outcome Measure   Help from another person eating meals?: A Lot Help from another person taking care of personal grooming?: A Lot Help from another person toileting, which includes using toliet, bedpan, or urinal?: A Lot Help from another person bathing (including washing, rinsing, drying)?: A Lot Help from another person to put on and taking off regular upper body clothing?: A Lot Help from another person to put on and taking off regular lower body clothing?: A Lot 6 Click Score: 12    End of Session    OT Visit Diagnosis: Muscle weakness (generalized) (M62.81);Other abnormalities of gait and mobility (R26.89);History of falling (Z91.81)   Activity Tolerance Patient limited by fatigue   Patient Left in bed;with call bell/phone within reach;with bed alarm set   Nurse Communication Mobility status        Time: 9629-5284 OT Time Calculation (min): 16 min  Charges: OT General Charges $OT Visit: 1 Visit OT Treatments $Self Care/Home Management : 8-22 mins  [facility] MS, OTR/L ascom [phone]  08/01/22, 5:00 PM

## 2022-08-01 NOTE — Progress Notes (Signed)
  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  Pt was recently discharged from hospital on 07/14/22 to [facility] for rehab, and pt said when he was discharged home on 4/26, he couldn't walk.   Weakness in all extremities  --Pt lives alone.  [name] he was walking prior to last hospitalization, but became weak after his falls (likely the other way around, had falls after he became weak).  Pt was in rehab between 4/11 to 4/26, but was not able to walk by the time he was discharged home. --PT eval today also found him with weakness with both arms, and pt needed assistance with feeding. --CT cervical spine no acute finding Plan: --neuro consult today --PT/OT  * Chest pain, ACS ruled out --trop 30's flat  Non-traumatic rhabdomyolysis --CK 420, not very elevated.   --cont MIVF  BPH (benign prostatic hyperplasia) Continue proscar.  Monitor I/O.  Lung nodules --there are known by pt's oncologist.  Current CT chest showed "Bilateral subsolid pulmonary nodules are unchanged, largest in the inferior anterior right upper lobe measuring 2.9 x 1.8 cm. 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 Orlie Dakin  MGUS  --follows with Dr. Orlie Dakin   DVT prophylaxis: Lovenox SQ Code Status: Full code  Family Communication:  Level of care: Telemetry Medical Dispo:   The patient is from: home Anticipated d/c is to: likely rehab Anticipated d/c date is: 1-2 days   Subjective and Interval History:  Pt needed hoyer lift for transfer, and had arm weakness that he couldn't feed himself.  Neuro consult today.   Objective: Vitals:   07/31/22 2045 08/01/22 0535 08/01/22 0737  08/01/22 1459  BP: (!) 110/58 129/62 (!) 143/75 128/74  Pulse: 77 63 70 90  Resp:   16 17  Temp:   98.4 F (36.9 C) 98.2 F (36.8 C)  TempSrc:      SpO2: 98% 97% 97% 98%  Weight:      Height:        Intake/Output Summary (Last 24 hours) at 08/01/2022 1926 Last data filed at 08/01/2022 1518 Gross per 24 hour  Intake 240 ml  Output 2100 ml  Net -1860 ml   Filed Weights   07/30/22 1330  Weight: 72.6 kg    Examination:   Constitutional: NAD, AAOx3 HEENT: conjunctivae and lids normal, EOMI CV: No cyanosis.   RESP: normal respiratory effort, on RA   Data Reviewed: I have personally reviewed labs and imaging studies  Time spent: 25 minutes  Darlin Priestly, MD [facility] If 7PM-7AM, please contact night-coverage 08/01/2022, 7:26 PM

## 2022-08-01 NOTE — Evaluation (Addendum)
Clinical/Bedside Swallow Evaluation Patient Details  Name: Frank Buckley MRN: [id number] Date of Birth: [birth year]  Today's Date: 08/01/2022 Time: SLP Start Time (ACUTE ONLY): 1020 SLP Stop Time (ACUTE ONLY): 1120 SLP Time Calculation (min) (ACUTE ONLY): 60 min  Past Medical History:  Past Medical History:  Diagnosis Date   Acute metabolic encephalopathy 07/10/2022   Borderline diabetic    Cancer (HCC)    Elevated liver enzymes 07/10/2022   GERD (gastroesophageal reflux disease)    Past Surgical History:  Past Surgical History:  Procedure Laterality Date   CERVICAL FUSION     HPI:  Pt is a 77 y/o M admitted on 07/30/22 after presenting with c/o weakness, fall & chest pain. Chest pain is thought to be 2/2 new finding of lung mass.  Pt had a recent admit on 07/10/2022 w/ profound weakness then and d/c'd to SNF.  PMH: acute metabolic encephalopathy, borderline diabetic, cancer, GERD    Assessment / Plan / Recommendation  Clinical Impression   Pt seen today for BSE. Pt alert, cooperative, and pleasant t/o eval. Pt sitting up in chair post PT session. Pt denied any difficulties w/ swallowing to ST. Pt Expressed that he enjoys current diet of Dys 2 w/ thins because it is easier for him to eat in setting of edentulous status. Pt required full support to complete oral care d/t weak UEs bilat. -- he was able to complete such last admit independently.  Pt on RA; afebrile; WBC WNL.   OM exam completed and notable for edentulous status. No dentures available. Otherwise, lingual/labial strength/ROM/symmetry WFL.   Pt appears to present w/ a functional oropharyngeal swallowing w/ a modified Dys 2 diet in setting of Edentulous status; and thin liquids via straw. Pt expressed desire for a more MINCED foods diet (Dys 2) and exhibited no overt s/s of aspiration such as coughing, throat clearing, nor wet vocal quality following any po's.  Pt appears at reduced risk for aspiration when following general  aspiration precautions and using a modified food consistency diet in setting of Edentulous status. Pt also exhibits overt s/s of REFLUX activity and c/o such at Baseline stating he "used to take something for it". ANY Esophageal Dysmotility or Regurgitation of Reflux material can increase risk for aspiration of the Reflux material during Retrograde flow thus impact Voicing and Pulmonary status. Will consult MD re: possible PPI as pt is NOT on any REFLUX meds.    Pt observed w/ trials of thin liquids via straw, purees, and minced/softened solids (softened in puree). Pt was observed swallowing Pills w/ water w/ NSG w/out overt deficits noted. Pt required feeding of foods/liquids which is a decline from previous admit. During the oral phase, pt exhibited appropriate bolus control/management, adequate gumming/mashing of soft solids, timely A-P bolus transfer, and clearing of oral cavity post-po's. During the pharyngeal phase, pt exhibited seemingly timely pharyngeal swallow and clear vocal quality post-po's. No overt clinical s/s of aspiration noted such as coughing, throat clearing, not wet vocal quality. No decline in respiratory status during/post trials. Overt Belching noted.    Pt educated on General REFLUX and aspiration precautions (sitting upright, eating/drinking slowly, small sips/bites, taking rest breaks) and diet recommendation/prep of foods/options. Pt appreciative/agreed.   Recommend a Dys 2 diet w/ thin liquids. Pt repeated that he "liked this kind of food" for the ease of eating. Recommend general aspiration precautions; support w/ feeding at meals d/t UE weakness. Recommend REFLUX precautions. Recommend pills Whole w/ liquids 1-2 at a time as  needed; vs whole in a Puree for ease of swallowing.  MD reported that Neurology is being consulted d/t he UE weaknes(bilat) as this is a change in function since last admit. Also recommended f/u w/ GI for further REFLUX management. No further skilled ST  services indicated. ST will s/o. MD to reconsult if an new needs during this admission. NSG/MD/pt updated/agreed. SLP Visit Diagnosis: Dysphagia, oral phase (R13.11) (Edentulous)    Aspiration Risk   (reduced following general aspiration precautions and REFLUX precautions)    Diet Recommendation   a Dys 2 diet w/ thin liquids. Pt repeated that he "liked this kind of food" for the ease of eating. Recommend general aspiration precautions; support w/ feeding at meals d/t UE weakness. Recommend REFLUX precautions.   Medication Administration: Whole meds with liquid (vs Whole in Puree if needed)    Other  Recommendations Recommended Consults: Consider GI evaluation;Consider esophageal assessment (Dietician f/u) Oral Care Recommendations: Oral care BID;Oral care before and after PO;Staff/trained caregiver to provide oral care    Recommendations for follow up therapy are one component of a multi-disciplinary discharge planning process, led by the attending physician.  Recommendations may be updated based on patient status, additional functional criteria and insurance authorization.  Follow up Recommendations No SLP follow up      Assistance Recommended at Discharge  Full   Functional Status Assessment Patient has not had a recent decline in their functional status  Frequency and Duration  (n/a)   (n/a)       Prognosis Prognosis for improved oropharyngeal function: Good Barriers to Reach Goals: Time post onset;Severity of deficits Barriers/Prognosis Comment: weak UEs impacting self-feeding; Edentulous status      Swallow Study   General Date of Onset: 07/30/22 HPI: Pt is a 77 y/o M admitted on 07/30/22 after presenting with c/o weakness, fall & chest pain. Chest pain is thought to be 2/2 new finding of lung mass.  Pt had a recent admit on 07/10/2022 w/ profound weakness then and d/c'd to SNF.  PMH: acute metabolic encephalopathy, borderline diabetic, cancer, GERD Type of Study: Bedside  Swallow Evaluation Previous Swallow Assessment: BSE on 07/12/2022- dys. 2 w/ thins(edentulous) Diet Prior to this Study: Dysphagia 2 (finely chopped);Thin liquids (Level 0) Temperature Spikes Noted: No (wbc 5.9) Respiratory Status: Room air History of Recent Intubation: No Behavior/Cognition: Alert;Cooperative;Pleasant mood;Requires cueing (min) Oral Cavity Assessment: Within Functional Limits Oral Care Completed by SLP: Yes Oral Cavity - Dentition: Edentulous Vision: Functional for self-feeding Self-Feeding Abilities: Needs assist;Total assist (weak UEs) Patient Positioning: Upright in chair (then bed for Lunch) Baseline Vocal Quality: Normal Volitional Cough: Strong Volitional Swallow: Able to elicit    Oral/Motor/Sensory Function Overall Oral Motor/Sensory Function: Within functional limits   Ice Chips Ice chips: Within functional limits Presentation: Spoon (fed; 2 trials)   Thin Liquid Thin Liquid: Within functional limits Presentation: Self Fed;Straw (supported; ~7-8 ozs) Other Comments:  (w/ pills also w/ NSG)    Nectar Thick Nectar Thick Liquid: Not tested   Honey Thick Honey Thick Liquid: Not tested   Puree Puree: Within functional limits Presentation: Spoon (fed; 10 trials)   Solid     Solid: Impaired Presentation: Spoon (fed; 9-10 trials of minced foods) Oral Phase Impairments: Impaired mastication (edentulous) Pharyngeal Phase Impairments:  (none)         Jerilynn Som, MS, CCC-SLP Speech Language Pathologist Rehab Services; [facility] [place] [phone] (ascom) Ashyra Cantin 08/01/2022,1:06 PM

## 2022-08-01 NOTE — Progress Notes (Addendum)
Physical Therapy Treatment Patient Details Name: Frank Buckley MRN: [id number] DOB: January 09, 1946 Today's Date: 08/01/2022   History of Present Illness Pt is a 77 y/o M admitted on 07/30/22 after presenting with c/o weakness, fall & chest pain. Chest pain is thought to be 2/2 new finding of lung mass. PMH: acute metabolic encephalopathy, borderline diabetic, cancer, GERD    PT Comments    Patient in bed upon arrival to session. Patient on bed pan, patient unsuccessful. RN Notified. Patient agreeable to therapy session. Patient continues to require Max A +2 for bed mobility and transfers 2/2 BUE/BLE weakness and decreased sitting balance. Frequent posterior lean noted with unsupported sitting at EOB require multimodal cues and assist to maintain static seated balance. Completed stand pivot from bed to recliner with Max + 2 required. Hoyer pad placed under if needed for return to bed due to fatigue. RN notified of patient position. Pt will continue to be benefit from skilled acute PT services to address deficits, and maximize independence with mobility and return to PLOF.    Recommendations for follow up therapy are one component of a multi-disciplinary discharge planning process, led by the attending physician.  Recommendations may be updated based on patient status, additional functional criteria and insurance authorization.  Follow Up Recommendations  Can patient physically be transported by private vehicle: No    Assistance Recommended at Discharge Frequent or constant Supervision/Assistance  Patient can return home with the following Assistance with cooking/housework;Assistance with feeding;Direct supervision/assist for medications management;Direct supervision/assist for financial management;Assist for transportation;Help with stairs or ramp for entrance;Two people to help with walking and/or transfers;Two people to help with bathing/dressing/bathroom   Equipment Recommendations  None  recommended by PT (TBD at next venue)    Recommendations for Other Services       Precautions / Restrictions Precautions Precautions: Fall Restrictions Weight Bearing Restrictions: No     Mobility  Bed Mobility Overal bed mobility: Needs Assistance Bed Mobility: Rolling, Sidelying to Sit Rolling: Max assist, +2 for physical assistance Sidelying to sit: Max assist, +2 for physical assistance       General bed mobility comments: Rolling to L for removal of bed pan Max A +2 required d/t weakness and assistance with hand placement and BLE. Sidelying to sit assistance required at trunk and with BLE    Transfers Overall transfer level: Needs assistance Equipment used: None Transfers: Bed to chair/wheelchair/BSC, Sit to/from Stand Sit to Stand: Max assist, +2 physical assistance Stand pivot transfers: Max assist, +2 physical assistance         General transfer comment: completed stand pivot from bed > w/c with Max A +2; completed additional sit > stand with to allow for proper placement of hoyer sling.    Ambulation/Gait               General Gait Details: unable to complete this date due to weakness/safety concerns   Stairs             Wheelchair Mobility    Modified Rankin (Stroke Patients Only)       Balance Overall balance assessment: Needs assistance Sitting-balance support: No upper extremity supported, Feet supported Sitting balance-Leahy Scale: Poor Sitting balance - Comments: intermittent posterior lean with intermittent LOB requiring Min A and multimodal cues Postural control: Posterior lean                                  Cognition  Arousal/Alertness: Lethargic Behavior During Therapy: WFL for tasks assessed/performed Overall Cognitive Status: No family/caregiver present to determine baseline cognitive functioning                                 General Comments: Pt pleasant & cooperative with session         Exercises Other Exercises Other Exercises: completed hip flexion and SLR's bilaterally x 10 reps; more physical assistance required with RLE > LLE Other Exercises: with towel roll completed bilateral hand squeeze x 5 reps; shoulder flexion. very limited with RLE    General Comments        Pertinent Vitals/Pain Pain Assessment Pain Assessment: No/denies pain    Home Living                          Prior Function            PT Goals (current goals can now be found in the care plan section) Acute Rehab PT Goals Patient Stated Goal: get better PT Goal Formulation: With patient Time For Goal Achievement: 08/14/22 Potential to Achieve Goals: Fair Progress towards PT goals: Progressing toward goals    Frequency    Min 3X/week      PT Plan Current plan remains appropriate    Co-evaluation              AM-PAC PT "6 Clicks" Mobility   Outcome Measure  Help needed turning from your back to your side while in a flat bed without using bedrails?: A Lot Help needed moving from lying on your back to sitting on the side of a flat bed without using bedrails?: A Lot Help needed moving to and from a bed to a chair (including a wheelchair)?: Total Help needed standing up from a chair using your arms (e.g., wheelchair or bedside chair)?: Total Help needed to walk in hospital room?: Total Help needed climbing 3-5 steps with a railing? : Total 6 Click Score: 8    End of Session Equipment Utilized During Treatment: Gait belt Activity Tolerance: Patient tolerated treatment well Patient left: in chair;with chair alarm set (hoyer sling left under patient if needed for return to bed) Nurse Communication: Mobility status PT Visit Diagnosis: Difficulty in walking, not elsewhere classified (R26.2);Other abnormalities of gait and mobility (R26.89);Muscle weakness (generalized) (M62.81)     Time: 1610-9604 PT Time Calculation (min) (ACUTE ONLY): 28 min  Charges:   $Therapeutic Activity: 23-37 mins                    Creed Copper Fairly, PT, DPT 08/01/22 10:41 AM

## 2022-08-02 ENCOUNTER — Inpatient Hospital Stay: Payer: Medicare Other

## 2022-08-02 DIAGNOSIS — G959 Disease of spinal cord, unspecified: Secondary | ICD-10-CM | POA: Diagnosis not present

## 2022-08-02 DIAGNOSIS — G9589 Other specified diseases of spinal cord: Secondary | ICD-10-CM

## 2022-08-02 DIAGNOSIS — M4802 Spinal stenosis, cervical region: Secondary | ICD-10-CM

## 2022-08-02 MED ORDER — GADOBUTROL 1 MMOL/ML IV SOLN
7.5000 mL | Freq: Once | INTRAVENOUS | Status: AC | PRN
  Administered 2022-08-02: 7.5 mL via INTRAVENOUS

## 2022-08-02 MED ORDER — POLYETHYLENE GLYCOL 3350 17 G PO PACK
17.0000 g | PACK | Freq: Two times a day (BID) | ORAL | Status: DC
  Administered 2022-08-02 – 2022-08-08 (×8): 17 g via ORAL
  Filled 2022-08-02 (×10): qty 1

## 2022-08-02 NOTE — Consult Note (Signed)
Consult requested by:  Dr. Fran Buckley  Consult requested for:  Cervical myelopathy  Primary Physician:  Frank Sato, MD  History of Present Illness: 08/02/2022 [name] is here today with a chief complaint of worsening weakness over the past several weeks to couple of months.  He is now having trouble lifting his arms past his shoulders.  He has severe numbness and tingling on his hands.  He has lost his ability to walk.  He has also developed some urinary and partial bowel incontinence.  All of the symptoms have been worse and worse over the past month.  He is having difficulty handling utensils currently.  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  The symptoms are causing a significant impact on the patient's life.   I have utilized the care everywhere function in epic to review the outside records available from external health systems.  Review of Systems:  A 10 point review of systems is negative, except for the pertinent positives and negatives detailed in the HPI.  Past Medical History: Past Medical History:  Diagnosis Date   Acute metabolic encephalopathy 07/10/2022   Borderline diabetic    Cancer (HCC)    Elevated liver enzymes 07/10/2022   GERD (gastroesophageal reflux disease)     Past Surgical History: Past Surgical History:  Procedure Laterality Date   CERVICAL FUSION      Allergies: Allergies as of 07/30/2022 - Review Complete 07/30/2022  Allergen Reaction Noted   Penicillins Rash 11/27/2015    Medications: Current Meds  Medication Sig   acetaminophen (TYLENOL) 325 MG tablet Take 2 tablets (650 mg total) by mouth every 6 (six) hours as needed for mild pain (or Fever >/= 101).   docusate sodium (COLACE) 100 MG capsule Take 100 mg by mouth daily.   doxazosin (CARDURA) 8 MG tablet Take 1 tablet (8 mg total) by mouth daily.   ergocalciferol (VITAMIN D2) 1.25 MG (50000 UT) capsule Take 1  capsule (50,000 Units total) by mouth once a week. (Patient taking differently: Take 50,000 Units by mouth once a week. Patient takes on Saturdays.)   finasteride (PROSCAR) 5 MG tablet Take 1 tablet (5 mg total) by mouth daily.   ibuprofen (ADVIL,MOTRIN) 600 MG tablet Take 1 tablet (600 mg total) by mouth every 6 (six) hours as needed for moderate pain.   melatonin 3 MG TABS tablet Take 6 mg by mouth at bedtime.   Miconazole Nitrate (LOTRIMIN AF POWDER) 2 % AERP Apply 1 application topically 2 (two) times daily.   mirabegron ER (MYRBETRIQ) 25 MG TB24 tablet Take 1 tablet (25 mg total) by mouth daily.   omeprazole (PRILOSEC) 20 MG capsule Take 20 mg by mouth daily.   pravastatin (PRAVACHOL) 40 MG tablet SMARTSIG:1 Tablet(s) By Mouth Every Evening    Social History: Social History   Tobacco Use   Smoking status: Former   Smokeless tobacco: Never  Building services engineer Use: Never used  Substance Use Topics   Alcohol use: No   Drug use: Never    Family Medical History: History reviewed. No pertinent family history.  Physical Examination: Vitals:   08/01/22 2334 08/02/22 0740  BP: 120/72 130/81  Pulse: 84 66  Resp: 18 18  Temp: 98.2 F (36.8 C) 97.9 F (36.6 C)  SpO2: 100% 99%    General: Patient is in no distress. Attention to examination is appropriate.  Neck:   Limited motion  Respiratory: Patient is breathing without any difficulty. He does take more breaths than expected   NEUROLOGICAL:     Awake, alert, oriented to person, place, and time.  Speech is clear and fluent.  Cranial Nerves: Pupils equal round and reactive to light.  Facial tone is symmetric.  Facial sensation is symmetric. Shoulder shrug is symmetric. Tongue protrusion is midline.  He cannot lift his arms to perform pronator drift Strength: Side Biceps Triceps Deltoid Interossei Grip Wrist Ext. Wrist Flex.  R 3 4- 3 3 3 3 3   L 3 4- 3 3 3 3 3    Side Iliopsoas Quads Hamstring PF DF EHL  R 4- 4- 4- 4 4 4    L 4 4 4 4 4 4    Reflexes are 1+ and symmetric at the biceps, triceps, brachioradialis, 3+patella and achilles.   Hoffman's is absent.   Bilateral upper and lower extremity sensation is diminished to light touch.      Gait is untested - he cannot walk.     Medical Decision Making  Imaging: MRI CT spine and Brain 08/02/2022 FINDINGS: MRI HEAD FINDINGS   Brain: No acute infarct, mass effect or extra-axial collection. No acute or chronic hemorrhage. There is multifocal hyperintense T2-weighted signal within the white matter. Parenchymal volume and CSF spaces are normal. The midline structures are normal. There is no abnormal contrast enhancement.   Vascular: Major flow voids are preserved.   Skull and upper cervical spine: Normal calvarium and skull base.   Sinuses/Orbits:No paranasal sinus fluid levels or advanced mucosal thickening. No mastoid or middle ear effusion. Normal orbits.   MRI CERVICAL SPINE FINDINGS   Alignment: Grade 1 anterolisthesis at C4-5. Grade 1 retrolisthesis at C6-7. Grade 1 anterolisthesis at C7-T1.   Vertebrae: C3-4 ACDF with solid anterior arthrodesis.   Cord: Compression of the spinal cord at the C2-3 level with associated signal change. No abnormal contrast enhancement.   Posterior Fossa, vertebral arteries, paraspinal tissues: Unremarkable   Disc levels:   C1-2: Unremarkable.   C2-3: Disc bulge and facet hypertrophy. Severe spinal canal stenosis. Severe bilateral neural foraminal stenosis.   C3-4: Solid anterior arthrodesis. There is no spinal canal stenosis. Mild bilateral neural foraminal stenosis.   C4-5: Small disc bulge with mild facet hypertrophy. There is no spinal canal stenosis. Mild right and moderate left neural foraminal stenosis.   C5-6: Disc bulge with uncovertebral hypertrophy. Mild spinal canal stenosis. Mild right and severe left neural foraminal stenosis.   C6-7: Small disc bulge. No spinal canal stenosis. Mild  bilateral neural foraminal stenosis.   C7-T1: Grade 1 anterolisthesis due to facet arthrosis. Mild spinal canal stenosis. Severe bilateral neural foraminal stenosis.   MRI THORACIC SPINE FINDINGS   Alignment:  Physiologic.   Vertebrae: No fracture, evidence of discitis, or bone lesion.   Cord:  Normal signal and morphology.   Paraspinal and other soft tissues: Small pleural effusions.   Disc levels: No spinal canal stenosis  IMPRESSION: 1. 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 2. No demyelinating lesions of the spinal cord. 3. Multilevel moderate-to-severe cervical neural foraminal stenosis. 4. Small pleural effusions.   Critical Value/emergent results were called by telephone at the time of interpretation on 08/02/2022 at 3:15 am to provider [facility], who verbally acknowledged these results.     Electronically Signed   By: Deatra Robinson M.D.   On: 08/02/2022 03:16  I have personally  reviewed the images and agree with the above interpretation.  Assessment and Plan: Frank Buckley is a pleasant 77 y.o. 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  He has myelomalacia noted.  He has had rapid progression of his symptoms over the past 4 weeks or so.  He has severe and critical stenosis at C2-3.  There is no role for ongoing conservative management for this.  If he is medically appropriate, I have recommended posterior cervical decompression and fusion with C2-3 decompression.  This would involve instrumentation from C2-C4 given his prior C3-4 anterior cervical discectomy and fusion.  I discussed the planned procedure at length with the patient, including the risks, benefits, alternatives, and indications. The risks discussed include but are not limited to bleeding, infection, need for reoperation, spinal fluid leak, stroke, vision loss,  anesthetic complication, coma, paralysis, and even death. I also described in detail that improvement was not guaranteed.  The patient expressed understanding of these risks, and asked that we proceed with surgery. I described the surgery in layman's terms, and gave ample opportunity for questions, which were answered to the best of my ability.  I spent a total of 45 minutes in this patient's care today. This time was spent reviewing pertinent records including imaging studies, obtaining and confirming history, performing a directed evaluation, formulating and discussing my recommendations, and documenting the visit within the medical record.     I have communicated my recommendations to the requesting physician and coordinated care to facilitate these recommendations.     Saifan Rayford K. Myer Haff MD, [facility] Neurosurgery

## 2022-08-02 NOTE — Progress Notes (Signed)
       CROSS COVER NOTE  NAME: Frank Buckley MRN: [id number] DOB : 1945-08-11 ATTENDING PHYSICIAN: Darlin Priestly, MD    Date of Service   08/02/2022   HPI/Events of Note   Report/Request  Message received from [facility] Neurologist communicating results of Frank C-Spine and T-Spine. 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   Interventions   Assessment/Plan:  Message sent to Dr Myer Haff with Neurosurgery who will see Frank Buckley in consultation today.        To reach the provider On-Call:   7AM- 7PM see care teams to locate the attending and reach out to them via [email or web address] Password: [email or web address] 7PM-7AM contact night-coverage If you still have difficulty reaching the appropriate provider, please page the [facility] (Director on Call) for [facility] on amion for assistance  This document was prepared using Conservation officer, historic buildings and may include unintentional dictation errors.  Bishop Limbo DNP, MBA, FNP-BC, PMHNP-BC Nurse Practitioner [facility] Pager [phone]

## 2022-08-02 NOTE — H&P (View-Only) (Signed)
  Consult requested by:  Dr. Lai  Consult requested for:  Cervical myelopathy  Primary Physician:  Miles, Linda M, MD  History of Present Illness: 08/02/2022 [name] is here today with a chief complaint of worsening weakness over the past several weeks to couple of months.  He is now having trouble lifting his arms past his shoulders.  He has severe numbness and tingling on his hands.  He has lost his ability to walk.  He has also developed some urinary and partial bowel incontinence.  All of the symptoms have been worse and worse over the past month.  He is having difficulty handling utensils currently.  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  The symptoms are causing a significant impact on the patient's life.   I have utilized the care everywhere function in epic to review the outside records available from external health systems.  Review of Systems:  A 10 point review of systems is negative, except for the pertinent positives and negatives detailed in the HPI.  Past Medical History: Past Medical History:  Diagnosis Date   Acute metabolic encephalopathy 07/10/2022   Borderline diabetic    Cancer (HCC)    Elevated liver enzymes 07/10/2022   GERD (gastroesophageal reflux disease)     Past Surgical History: Past Surgical History:  Procedure Laterality Date   CERVICAL FUSION      Allergies: Allergies as of 07/30/2022 - Review Complete 07/30/2022  Allergen Reaction Noted   Penicillins Rash 11/27/2015    Medications: Current Meds  Medication Sig   acetaminophen (TYLENOL) 325 MG tablet Take 2 tablets (650 mg total) by mouth every 6 (six) hours as needed for mild pain (or Fever >/= 101).   docusate sodium (COLACE) 100 MG capsule Take 100 mg by mouth daily.   doxazosin (CARDURA) 8 MG tablet Take 1 tablet (8 mg total) by mouth daily.   ergocalciferol (VITAMIN D2) 1.25 MG (50000 UT) capsule Take 1  capsule (50,000 Units total) by mouth once a week. (Patient taking differently: Take 50,000 Units by mouth once a week. Patient takes on Saturdays.)   finasteride (PROSCAR) 5 MG tablet Take 1 tablet (5 mg total) by mouth daily.   ibuprofen (ADVIL,MOTRIN) 600 MG tablet Take 1 tablet (600 mg total) by mouth every 6 (six) hours as needed for moderate pain.   melatonin 3 MG TABS tablet Take 6 mg by mouth at bedtime.   Miconazole Nitrate (LOTRIMIN AF POWDER) 2 % AERP Apply 1 application topically 2 (two) times daily.   mirabegron ER (MYRBETRIQ) 25 MG TB24 tablet Take 1 tablet (25 mg total) by mouth daily.   omeprazole (PRILOSEC) 20 MG capsule Take 20 mg by mouth daily.   pravastatin (PRAVACHOL) 40 MG tablet SMARTSIG:1 Tablet(s) By Mouth Every Evening    Social History: Social History   Tobacco Use   Smoking status: Former   Smokeless tobacco: Never  Vaping Use   Vaping Use: Never used  Substance Use Topics   Alcohol use: No   Drug use: Never    Family Medical History: History reviewed. No pertinent family history.  Physical Examination: Vitals:   08/01/22 2334 08/02/22 0740  BP: 120/72 130/81  Pulse: 84 66  Resp: 18 18  Temp: 98.2 F (36.8 C) 97.9 F (36.6 C)  SpO2: 100% 99%    General: Patient is in no distress. Attention to examination is appropriate.  Neck:   Limited motion    Respiratory: Patient is breathing without any difficulty. He does take more breaths than expected   NEUROLOGICAL:     Awake, alert, oriented to person, place, and time.  Speech is clear and fluent.  Cranial Nerves: Pupils equal round and reactive to light.  Facial tone is symmetric.  Facial sensation is symmetric. Shoulder shrug is symmetric. Tongue protrusion is midline.  He cannot lift his arms to perform pronator drift Strength: Side Biceps Triceps Deltoid Interossei Grip Wrist Ext. Wrist Flex.  R 3 4- 3 3 3 3 3  L 3 4- 3 3 3 3 3   Side Iliopsoas Quads Hamstring PF DF EHL  R 4- 4- 4- 4 4 4   L 4 4 4 4 4 4   Reflexes are 1+ and symmetric at the biceps, triceps, brachioradialis, 3+patella and achilles.   Hoffman's is absent.   Bilateral upper and lower extremity sensation is diminished to light touch.      Gait is untested - he cannot walk.     Medical Decision Making  Imaging: MRI CT spine and Brain 08/02/2022 FINDINGS: MRI HEAD FINDINGS   Brain: No acute infarct, mass effect or extra-axial collection. No acute or chronic hemorrhage. There is multifocal hyperintense T2-weighted signal within the white matter. Parenchymal volume and CSF spaces are normal. The midline structures are normal. There is no abnormal contrast enhancement.   Vascular: Major flow voids are preserved.   Skull and upper cervical spine: Normal calvarium and skull base.   Sinuses/Orbits:No paranasal sinus fluid levels or advanced mucosal thickening. No mastoid or middle ear effusion. Normal orbits.   MRI CERVICAL SPINE FINDINGS   Alignment: Grade 1 anterolisthesis at C4-5. Grade 1 retrolisthesis at C6-7. Grade 1 anterolisthesis at C7-T1.   Vertebrae: C3-4 ACDF with solid anterior arthrodesis.   Cord: Compression of the spinal cord at the C2-3 level with associated signal change. No abnormal contrast enhancement.   Posterior Fossa, vertebral arteries, paraspinal tissues: Unremarkable   Disc levels:   C1-2: Unremarkable.   C2-3: Disc bulge and facet hypertrophy. Severe spinal canal stenosis. Severe bilateral neural foraminal stenosis.   C3-4: Solid anterior arthrodesis. There is no spinal canal stenosis. Mild bilateral neural foraminal stenosis.   C4-5: Small disc bulge with mild facet hypertrophy. There is no spinal canal stenosis. Mild right and moderate left neural foraminal stenosis.   C5-6: Disc bulge with uncovertebral hypertrophy. Mild spinal canal stenosis. Mild right and severe left neural foraminal stenosis.   C6-7: Small disc bulge. No spinal canal stenosis. Mild  bilateral neural foraminal stenosis.   C7-T1: Grade 1 anterolisthesis due to facet arthrosis. Mild spinal canal stenosis. Severe bilateral neural foraminal stenosis.   MRI THORACIC SPINE FINDINGS   Alignment:  Physiologic.   Vertebrae: No fracture, evidence of discitis, or bone lesion.   Cord:  Normal signal and morphology.   Paraspinal and other soft tissues: Small pleural effusions.   Disc levels: No spinal canal stenosis  IMPRESSION: 1. 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 2. No demyelinating lesions of the spinal cord. 3. Multilevel moderate-to-severe cervical neural foraminal stenosis. 4. Small pleural effusions.   Critical Value/emergent results were called by telephone at the time of interpretation on 08/02/2022 at 3:15 am to provider Srishti Bhagat, who verbally acknowledged these results.     Electronically Signed   By: Kevin  Herman Buckley.D.   On: 08/02/2022 03:16  I have personally   reviewed the images and agree with the above interpretation.  Assessment and Plan: Frank Buckley is a pleasant 77 y.o. 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  He has myelomalacia noted.  He has had rapid progression of his symptoms over the past 4 weeks or so.  He has severe and critical stenosis at C2-3.  There is no role for ongoing conservative management for this.  If he is medically appropriate, I have recommended posterior cervical decompression and fusion with C2-3 decompression.  This would involve instrumentation from C2-C4 given his prior C3-4 anterior cervical discectomy and fusion.  I discussed the planned procedure at length with the patient, including the risks, benefits, alternatives, and indications. The risks discussed include but are not limited to bleeding, infection, need for reoperation, spinal fluid leak, stroke, vision loss,  anesthetic complication, coma, paralysis, and even death. I also described in detail that improvement was not guaranteed.  The patient expressed understanding of these risks, and asked that we proceed with surgery. I described the surgery in layman's terms, and gave ample opportunity for questions, which were answered to the best of my ability.  I spent a total of 45 minutes in this patient's care today. This time was spent reviewing pertinent records including imaging studies, obtaining and confirming history, performing a directed evaluation, formulating and discussing my recommendations, and documenting the visit within the medical record.     I have communicated my recommendations to the requesting physician and coordinated care to facilitate these recommendations.     Tekisha Darcey K. Jaryd Drew MD, MPHS Neurosurgery  

## 2022-08-02 NOTE — Progress Notes (Signed)
Subjective: No complaints. Cousin at the bedside. They have spoken with Neurosurgery.   Objective: Current vital signs: BP 130/81   Pulse 66   Temp 97.9 F (36.6 C)   Resp 18   Ht 5\' 10"  (1.778 m)   Wt 69.2 kg   SpO2 99%   BMI 21.89 kg/m  Vital signs in last 24 hours: Temp:  [97.9 F (36.6 C)-98.2 F (36.8 C)] 97.9 F (36.6 C) (04/30 0740) Pulse Rate:  [66-90] 66 (04/30 0740) Resp:  [17-18] 18 (04/30 0740) BP: (120-130)/(72-81) 130/81 (04/30 0740) SpO2:  [98 %-100 %] 99 % (04/30 0740) Weight:  [69.2 kg] 69.2 kg (04/30 0500)  Intake/Output from previous day: 04/29 0701 - 04/30 0700 In: -  Out: 2850 [Urine:2850] Intake/Output this shift: No intake/output data recorded. Nutritional status:  Diet Order             DIET DYS 2 Room service appropriate? Yes with Assist; Fluid consistency: Thin  Diet effective now                  Physical Exam HEENT- [place]/AT   Lungs- Respirations unlabored Extremities- Warm and well-perfused. Chronic wasting of first dorsal interossei is noted.    Neurological Examination Mental Status: Awake, alert and oriented. Thought content appropriate.  Speech fluent without evidence of aphasia.  Able to follow all commands without difficulty. Cranial Nerves: II, III,IV, VI: Fixates and tracks normally VII: Smile symmetric VIII: Hearing intact to voice IX,X: No hypophonia or hoarseness.  XI: Symmetric XII: No lingual dysarthria  Motor: Motor exam is unchanged relative to yesterday: RUE: 3/5 deltoid with rapid fatiguability, biceps 3-4/5, triceps 3-4/5, grip 3/5, finger extension weakness also noted.  LUE: 3/5 deltoid with rapid fatiguability, biceps 3-4/5, triceps 3-4/5, grip 3/5, finger extension weakness also noted.  RLE: 4-/5 HF, knee extension and knee flexion. ADF and APF 4/5 LLE: 4/5 HF, knee extension and knee flexion. ADF and APF 4/5 Wasting of first dorsal interossei is noted bilaterally.  Sensory: Unchanged Deep Tendon  Reflexes: Unchanged   Cerebellar: No ataxia to BUE in the context of his weakness. Unable to perform H-S.  Gait: Deferred    Lab Results: Results for orders placed or performed during the hospital encounter of 07/30/22 (from the past 48 hour(s))  Basic metabolic panel     Status: None   Collection Time: 08/01/22  5:04 AM  Result Value Ref Range   Sodium 140 135 - 145 mmol/L   Potassium 3.5 3.5 - 5.1 mmol/L   Chloride 105 98 - 111 mmol/L   CO2 28 22 - 32 mmol/L   Glucose, Bld 83 70 - 99 mg/dL    Comment: Glucose reference range applies only to samples taken after fasting for at least 8 hours.   BUN 11 8 - 23 mg/dL   Creatinine, Ser 1.61 0.61 - 1.24 mg/dL   Calcium 8.9 8.9 - 09.6 mg/dL   GFR, Estimated >04 >54 mL/min    Comment: (NOTE) Calculated using the CKD-EPI Creatinine Equation (2021)    Anion gap 7 5 - 15    Comment: Performed at [facility], [address] [place], [place] [address]  CBC     Status: Abnormal   Collection Time: 08/01/22  5:04 AM  Result Value Ref Range   WBC 5.9 4.0 - 10.5 K/uL   RBC 3.34 (L) 4.22 - 5.81 MIL/uL   Hemoglobin 10.4 (L) 13.0 - 17.0 g/dL   HCT 91.4 (L) 78.2 - 95.6 %  MCV 94.9 80.0 - 100.0 fL   MCH 31.1 26.0 - 34.0 pg   MCHC 32.8 30.0 - 36.0 g/dL   RDW 81.1 91.4 - 78.2 %   Platelets 277 150 - 400 K/uL   nRBC 0.0 0.0 - 0.2 %    Comment: Performed at [facility], [address] [place], [place] [address]  Magnesium     Status: None   Collection Time: 08/01/22  5:04 AM  Result Value Ref Range   Magnesium 2.1 1.7 - 2.4 mg/dL    Comment: Performed at [facility], [address] [place], [place] [address]  Urinalysis, Routine w reflex microscopic -Urine, Clean Catch     Status: Abnormal   Collection Time: 08/01/22  1:26 PM  Result Value Ref Range   Color, Urine STRAW (A) YELLOW   APPearance CLEAR (A) CLEAR   Specific Gravity, Urine 1.006 1.005 - 1.030   pH 7.0 5.0 - 8.0   Glucose, UA NEGATIVE NEGATIVE  mg/dL   Hgb urine dipstick NEGATIVE NEGATIVE   Bilirubin Urine NEGATIVE NEGATIVE   Ketones, ur NEGATIVE NEGATIVE mg/dL   Protein, ur NEGATIVE NEGATIVE mg/dL   Nitrite NEGATIVE NEGATIVE   Leukocytes,Ua NEGATIVE NEGATIVE    Comment: Performed at [facility], [address] [place], [place] [address]    No results found for this or any previous visit (from the past 240 hour(s)).  Lipid Panel No results for input(s): "CHOL", "TRIG", "HDL", "CHOLHDL", "VLDL", "LDLCALC" in the last 72 hours.  Studies/Results: MR BRAIN W WO CONTRAST  Result Date: 08/02/2022 CLINICAL DATA:  Demyelinating disease EXAM: MRI HEAD WITHOUT AND WITH CONTRAST MRI CERVICAL SPINE WITHOUT AND WITH CONTRAST MRI CERVICAL THORACIC WITHOUT AND CONTRAST CONTRAST:  7.64mL GADAVIST GADOBUTROL 1 MMOL/ML IV SOLN TECHNIQUE: Multiplanar, multiecho pulse sequences of the brain and surrounding structures, and cervical and thoracic spine were obtained without and with intravenous contrast. COMPARISON:  None Available. FINDINGS: MRI HEAD FINDINGS Brain: No acute infarct, mass effect or extra-axial collection. No acute or chronic hemorrhage. There is multifocal hyperintense T2-weighted signal within the white matter. Parenchymal volume and CSF spaces are normal. The midline structures are normal. There is no abnormal contrast enhancement. Vascular: Major flow voids are preserved. Skull and upper cervical spine: Normal calvarium and skull base. Sinuses/Orbits:No paranasal sinus fluid levels or advanced mucosal thickening. No mastoid or middle ear effusion. Normal orbits. MRI CERVICAL SPINE FINDINGS Alignment: Grade 1 anterolisthesis at C4-5. Grade 1 retrolisthesis at C6-7. Grade 1 anterolisthesis at C7-T1. Vertebrae: C3-4 ACDF with solid anterior arthrodesis. Cord: Compression of the spinal cord at the C2-3 level with associated signal change. No abnormal contrast enhancement. Posterior Fossa, vertebral arteries, paraspinal tissues:  Unremarkable Disc levels: C1-2: Unremarkable. C2-3: Disc bulge and facet hypertrophy. Severe spinal canal stenosis. Severe bilateral neural foraminal stenosis. C3-4: Solid anterior arthrodesis. There is no spinal canal stenosis. Mild bilateral neural foraminal stenosis. C4-5: Small disc bulge with mild facet hypertrophy. There is no spinal canal stenosis. Mild right and moderate left neural foraminal stenosis. C5-6: Disc bulge with uncovertebral hypertrophy. Mild spinal canal stenosis. Mild right and severe left neural foraminal stenosis. C6-7: Small disc bulge. No spinal canal stenosis. Mild bilateral neural foraminal stenosis. C7-T1: Grade 1 anterolisthesis due to facet arthrosis. Mild spinal canal stenosis. Severe bilateral neural foraminal stenosis. MRI THORACIC SPINE FINDINGS Alignment:  Physiologic. Vertebrae: No fracture, evidence of discitis, or bone lesion. Cord:  Normal signal and morphology. Paraspinal and other soft tissues: Small pleural effusions. Disc levels:  No spinal canal stenosis IMPRESSION: 1. 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 2. No demyelinating lesions of the spinal cord. 3. Multilevel moderate-to-severe cervical neural foraminal stenosis. 4. Small pleural effusions. Critical Value/emergent results were called by telephone at the time of interpretation on 08/02/2022 at 3:15 am to provider [facility], who verbally acknowledged these results. Electronically Signed   By: Deatra Robinson M.D.   On: 08/02/2022 03:16   MR THORACIC SPINE W WO CONTRAST  Result Date: 08/02/2022 CLINICAL DATA:  Demyelinating disease EXAM: MRI HEAD WITHOUT AND WITH CONTRAST MRI CERVICAL SPINE WITHOUT AND WITH CONTRAST MRI CERVICAL THORACIC WITHOUT AND CONTRAST CONTRAST:  7.4mL GADAVIST GADOBUTROL 1 MMOL/ML IV SOLN TECHNIQUE: Multiplanar, multiecho pulse sequences of the brain and surrounding structures,  and cervical and thoracic spine were obtained without and with intravenous contrast. COMPARISON:  None Available. FINDINGS: MRI HEAD FINDINGS Brain: No acute infarct, mass effect or extra-axial collection. No acute or chronic hemorrhage. There is multifocal hyperintense T2-weighted signal within the white matter. Parenchymal volume and CSF spaces are normal. The midline structures are normal. There is no abnormal contrast enhancement. Vascular: Major flow voids are preserved. Skull and upper cervical spine: Normal calvarium and skull base. Sinuses/Orbits:No paranasal sinus fluid levels or advanced mucosal thickening. No mastoid or middle ear effusion. Normal orbits. MRI CERVICAL SPINE FINDINGS Alignment: Grade 1 anterolisthesis at C4-5. Grade 1 retrolisthesis at C6-7. Grade 1 anterolisthesis at C7-T1. Vertebrae: C3-4 ACDF with solid anterior arthrodesis. Cord: Compression of the spinal cord at the C2-3 level with associated signal change. No abnormal contrast enhancement. Posterior Fossa, vertebral arteries, paraspinal tissues: Unremarkable Disc levels: C1-2: Unremarkable. C2-3: Disc bulge and facet hypertrophy. Severe spinal canal stenosis. Severe bilateral neural foraminal stenosis. C3-4: Solid anterior arthrodesis. There is no spinal canal stenosis. Mild bilateral neural foraminal stenosis. C4-5: Small disc bulge with mild facet hypertrophy. There is no spinal canal stenosis. Mild right and moderate left neural foraminal stenosis. C5-6: Disc bulge with uncovertebral hypertrophy. Mild spinal canal stenosis. Mild right and severe left neural foraminal stenosis. C6-7: Small disc bulge. No spinal canal stenosis. Mild bilateral neural foraminal stenosis. C7-T1: Grade 1 anterolisthesis due to facet arthrosis. Mild spinal canal stenosis. Severe bilateral neural foraminal stenosis. MRI THORACIC SPINE FINDINGS Alignment:  Physiologic. Vertebrae: No fracture, evidence of discitis, or bone lesion. Cord:  Normal signal and  morphology. Paraspinal and other soft tissues: Small pleural effusions. Disc levels: No spinal canal stenosis IMPRESSION: 1. 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 2. No demyelinating lesions of the spinal cord. 3. Multilevel moderate-to-severe cervical neural foraminal stenosis. 4. Small pleural effusions. Critical Value/emergent results were called by telephone at the time of interpretation on 08/02/2022 at 3:15 am to provider [facility], who verbally acknowledged these results. Electronically Signed   By: Deatra Robinson M.D.   On: 08/02/2022 03:16   MR CERVICAL SPINE W WO CONTRAST  Result Date: 08/02/2022 CLINICAL DATA:  Demyelinating disease EXAM: MRI HEAD WITHOUT AND WITH CONTRAST MRI CERVICAL SPINE WITHOUT AND WITH CONTRAST MRI CERVICAL THORACIC WITHOUT AND CONTRAST CONTRAST:  7.41mL GADAVIST GADOBUTROL 1 MMOL/ML IV SOLN TECHNIQUE: Multiplanar, multiecho pulse sequences of the brain and surrounding structures, and cervical and thoracic spine were obtained without and with intravenous contrast. COMPARISON:  None Available. FINDINGS: MRI HEAD FINDINGS Brain: No acute infarct, mass effect or  extra-axial collection. No acute or chronic hemorrhage. There is multifocal hyperintense T2-weighted signal within the white matter. Parenchymal volume and CSF spaces are normal. The midline structures are normal. There is no abnormal contrast enhancement. Vascular: Major flow voids are preserved. Skull and upper cervical spine: Normal calvarium and skull base. Sinuses/Orbits:No paranasal sinus fluid levels or advanced mucosal thickening. No mastoid or middle ear effusion. Normal orbits. MRI CERVICAL SPINE FINDINGS Alignment: Grade 1 anterolisthesis at C4-5. Grade 1 retrolisthesis at C6-7. Grade 1 anterolisthesis at C7-T1. Vertebrae: C3-4 ACDF with solid anterior arthrodesis. Cord: Compression of the spinal cord  at the C2-3 level with associated signal change. No abnormal contrast enhancement. Posterior Fossa, vertebral arteries, paraspinal tissues: Unremarkable Disc levels: C1-2: Unremarkable. C2-3: Disc bulge and facet hypertrophy. Severe spinal canal stenosis. Severe bilateral neural foraminal stenosis. C3-4: Solid anterior arthrodesis. There is no spinal canal stenosis. Mild bilateral neural foraminal stenosis. C4-5: Small disc bulge with mild facet hypertrophy. There is no spinal canal stenosis. Mild right and moderate left neural foraminal stenosis. C5-6: Disc bulge with uncovertebral hypertrophy. Mild spinal canal stenosis. Mild right and severe left neural foraminal stenosis. C6-7: Small disc bulge. No spinal canal stenosis. Mild bilateral neural foraminal stenosis. C7-T1: Grade 1 anterolisthesis due to facet arthrosis. Mild spinal canal stenosis. Severe bilateral neural foraminal stenosis. MRI THORACIC SPINE FINDINGS Alignment:  Physiologic. Vertebrae: No fracture, evidence of discitis, or bone lesion. Cord:  Normal signal and morphology. Paraspinal and other soft tissues: Small pleural effusions. Disc levels: No spinal canal stenosis IMPRESSION: 1. 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 2. No demyelinating lesions of the spinal cord. 3. Multilevel moderate-to-severe cervical neural foraminal stenosis. 4. Small pleural effusions. Critical Value/emergent results were called by telephone at the time of interpretation on 08/02/2022 at 3:15 am to provider [facility], who verbally acknowledged these results. Electronically Signed   By: Deatra Robinson M.D.   On: 08/02/2022 03:16    Medications: Scheduled:  aspirin  81 mg Oral Daily   docusate sodium  100 mg Oral Daily   doxazosin  8 mg Oral Daily   enoxaparin (LOVENOX) injection  40 mg Subcutaneous Q24H   finasteride  5 mg Oral Daily   mirabegron ER  25  mg Oral Daily   pantoprazole  40 mg Oral Daily   sodium chloride flush  3 mL Intravenous Q12H    Assessment: 77 year old male with admission in early April for diffuse weakness in the setting of rhabdomyolysis. 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  - Exam reveals moderate to severe proximal and distal muscle weakness of all 4 limbs. Has normoactive reflexes in upper extremities and hyperreflexia in lower extremities.  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 - MRI thoracic spine +/- contrast: Unremarkable - MRI brain +/- contrast: No findings referable to the patient's weakness. Mild in extent, patchy hyperintense T2-weighted signal within the white matter is noted. . - MRI L-spine on 4/7 reveals prominent disc degeneration, annular bulge, facetal hypertrophy and hypertrophy of the ligamentum flavum resulting in severe narrowing of the thecal sac at the L4-5 level. Although the L4-5 stenosis could present with BLE weakness, it does not explain  his incontinence or his BUE weakness.   -  CT cervical spine: No fracture or static subluxation of the cervical spine. Anterior cervical discectomy and fusion of C3-C4 with bony incorporation. Severe disc space height loss and osteophytosis of the remaining cervical levels, worst at C5-C6. CT neck cannot be used to rule out intrinsic spinal cord pathology. - CT head: No acute intracranial pathology. Small-vessel white matter disease. Negative CT head does not rule out possible brainstem pathology.  - Localization:  - DDx: Myelopathy demonstrated at C2-3 on cervical spine MRI is highly likely to be the etiology for his weakness.  - Contributing factors: His rhabdomyolysis may be due to a severe degree of recent immobility  resulting from the myelopathy.   - Other DDx for his rhabdomyolysis includes statin-induced myopathy, EtOH use, illicit drug use (heroin, cocaine and amphetamine can result in rhabdomyolysis), autoimmune myopathy, cervical or thoracic myelopathy. Of note, he was on pravastatin at admission, which has been stopped.     Recommendations: - Neurosurgery is consulting for possible intervention in the setting of his severe compressive cervical myelopathy.  - Permanently discontinue pravastatin.  - Track daily CK and CPK levels - Renal protection per primary team - Respiratory therapy consult for FVC and NIF. Please document in progress notes section of the chart.  - PT/OT      LOS: 3 days   @Electronically  signed: Dr. Caryl Pina 08/02/2022  8:28 AM

## 2022-08-02 NOTE — Progress Notes (Signed)
  PROGRESS NOTE    Frank Buckley  RUE:[id number] DOB: 11-06-1945 DOA: 07/30/2022 PCP: Leanna Sato, MD  131A/131A-AA  LOS: 3 days   Brief hospital course:   Assessment & Plan: 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  Pt was recently discharged from hospital on 07/14/22 to [facility] for rehab, and pt said when he was discharged home on 4/26, he couldn't walk.   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  [name] he was walking prior to last hospitalization, but became weak after his falls (likely the other way around, had falls after he became weak).  Pt was in rehab between 4/11 to 4/26, but was not able to walk by the time he was discharged home. --PT eval also found him with weakness with both arms, and pt needed assistance with feeding. --CT cervical spine no acute finding.  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  Plan: --neuro consult today, plans for posterior cervical decompression and fusion with C2-3 decompression tomorrow  * Chest pain, ACS ruled out --trop 30's flat  Non-traumatic rhabdomyolysis --CK 420, not very elevated.   --s/p MIVF --discontinue home pravastatin   BPH (benign prostatic hyperplasia) Continue proscar.  Monitor I/O.  Lung nodules --there are known by pt's oncologist.  Current CT chest showed "Bilateral subsolid pulmonary nodules are unchanged, largest in the inferior anterior right upper lobe measuring 2.9 x 1.8 cm. 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 Orlie Dakin  MGUS  --follows with Dr. Orlie Dakin   DVT prophylaxis: Lovenox SQ Code Status: Full code  Family Communication:  Level of care: Telemetry Medical Dispo:   The patient is from: home Anticipated d/c is to: likely rehab Anticipated d/c date is: > 3  days   Subjective and Interval History:  Pt continued to have weakness in both arms.  RN reported pt had good appetite when being fed.  MRI showed progressive cervical myelopathy due to severe stenosis at C2-3.  Neurosurgery consulted and plans surgery for tomorrow.   Objective: Vitals:   08/01/22 2334 08/02/22 0500 08/02/22 0740 08/02/22 1604  BP: 120/72  130/81 109/65  Pulse: 84  66 75  Resp: 18  18 18   Temp: 98.2 F (36.8 C)  97.9 F (36.6 C) 98.1 F (36.7 C)  TempSrc:      SpO2: 100%  99% 99%  Weight:  69.2 kg    Height:        Intake/Output Summary (Last 24 hours) at 08/02/2022 2046 Last data filed at 08/02/2022 1922 Gross per 24 hour  Intake 360 ml  Output 950 ml  Net -590 ml   Filed Weights   07/30/22 1330 08/02/22 0500  Weight: 72.6 kg 69.2 kg    Examination:   Constitutional: NAD, AAOx3 HEENT: conjunctivae and lids normal, EOMI CV: No cyanosis.   RESP: normal respiratory effort, on RA Psych: Normal mood and affect.  Appropriate judgement and reason   Data Reviewed: I have personally reviewed labs and imaging studies  Time spent: 35 minutes  Darlin Priestly, MD [facility] If 7PM-7AM, please contact night-coverage 08/02/2022, 8:46 PM

## 2022-08-02 NOTE — Plan of Care (Signed)
Notified by radiology about patient's critical cervical spine compression. Discussed with Bishop Limbo, NP, recommended reaching out to neurosurgery for evaluation  MRI C-spine 1. 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 2. No demyelinating lesions of the spinal cord. 3. Multilevel moderate-to-severe cervical neural foraminal stenosis. 4. Small pleural effusions.

## 2022-08-03 ENCOUNTER — Inpatient Hospital Stay: Payer: Medicare Other | Admitting: Anesthesiology

## 2022-08-03 ENCOUNTER — Encounter: Payer: Self-pay | Admitting: Internal Medicine

## 2022-08-03 ENCOUNTER — Encounter
Admission: EM | Disposition: A | Discharge status: Skilled Nursing Facility | Payer: Self-pay | Source: Home / Self Care | Attending: Internal Medicine

## 2022-08-03 ENCOUNTER — Inpatient Hospital Stay: Payer: Medicare Other

## 2022-08-03 ENCOUNTER — Other Ambulatory Visit: Payer: Self-pay

## 2022-08-03 DIAGNOSIS — G959 Disease of spinal cord, unspecified: Secondary | ICD-10-CM | POA: Diagnosis not present

## 2022-08-03 DIAGNOSIS — M6282 Rhabdomyolysis: Secondary | ICD-10-CM

## 2022-08-03 DIAGNOSIS — M4802 Spinal stenosis, cervical region: Secondary | ICD-10-CM | POA: Diagnosis not present

## 2022-08-03 HISTORY — PX: APPLICATION OF INTRAOPERATIVE CT SCAN: SHX6668

## 2022-08-03 HISTORY — PX: POSTERIOR CERVICAL FUSION/FORAMINOTOMY: SHX5038

## 2022-08-03 LAB — BASIC METABOLIC PANEL
Anion gap: 7 (ref 5–15)
BUN: 11 mg/dL (ref 8–23)
CO2: 26 mmol/L (ref 22–32)
Calcium: 9 mg/dL (ref 8.9–10.3)
Chloride: 104 mmol/L (ref 98–111)
Creatinine, Ser: 0.6 mg/dL — ABNORMAL LOW (ref 0.61–1.24)
GFR, Estimated: >60 mL/min (ref >60)
Glucose, Bld: 91 mg/dL (ref 70–99)
Potassium: 4.1 mmol/L (ref 3.5–5.1)
Sodium: 137 mmol/L (ref 135–145)

## 2022-08-03 LAB — CBC
HCT: 34.7 % — ABNORMAL LOW (ref 39.0–52.0)
Hemoglobin: 11.5 g/dL — ABNORMAL LOW (ref 13.0–17.0)
MCH: 31.2 pg (ref 26.0–34.0)
MCHC: 33.1 g/dL (ref 30.0–36.0)
MCV: 94 fL (ref 80.0–100.0)
Platelets: 204 10*3/uL (ref 150–400)
RBC: 3.69 MIL/uL — ABNORMAL LOW (ref 4.22–5.81)
RDW: 12.6 % (ref 11.5–15.5)
WBC: 5.9 10*3/uL (ref 4.0–10.5)
nRBC: 0 % (ref 0.0–0.2)

## 2022-08-03 LAB — CK: Total CK: 147 U/L (ref 49–397)

## 2022-08-03 LAB — MAGNESIUM: Magnesium: 2.2 mg/dL (ref 1.7–2.4)

## 2022-08-03 SURGERY — POSTERIOR CERVICAL FUSION/FORAMINOTOMY LEVEL 3
Anesthesia: General

## 2022-08-03 MED ORDER — METHOCARBAMOL 1000 MG/10ML IJ SOLN: 500.0000 mg | Freq: Four times a day (QID) | INTRAVENOUS | Status: DC | PRN

## 2022-08-03 MED ORDER — DROPERIDOL 2.5 MG/ML IJ SOLN: 0.6250 mg | Freq: Once | INTRAMUSCULAR | Status: DC | PRN

## 2022-08-03 MED ORDER — ACETAMINOPHEN 10 MG/ML IV SOLN
INTRAVENOUS | Status: AC
  Filled 2022-08-03: qty 100

## 2022-08-03 MED ORDER — LIDOCAINE HCL (PF) 2 % IJ SOLN
INTRAMUSCULAR | Status: AC
  Filled 2022-08-03: qty 5

## 2022-08-03 MED ORDER — 0.9 % SODIUM CHLORIDE (POUR BTL) OPTIME
TOPICAL | Status: DC | PRN
  Administered 2022-08-03: 500 mL

## 2022-08-03 MED ORDER — BUPIVACAINE HCL (PF) 0.5 % IJ SOLN
INTRAMUSCULAR | Status: AC
  Filled 2022-08-03: qty 60

## 2022-08-03 MED ORDER — PROMETHAZINE HCL 25 MG/ML IJ SOLN: 6.2500 mg | INTRAMUSCULAR | Status: DC | PRN

## 2022-08-03 MED ORDER — DEXMEDETOMIDINE HCL IN NACL 80 MCG/20ML IV SOLN
INTRAVENOUS | Status: DC | PRN
  Administered 2022-08-03: 12 ug via INTRAVENOUS

## 2022-08-03 MED ORDER — FENTANYL CITRATE (PF) 100 MCG/2ML IJ SOLN
INTRAMUSCULAR | Status: AC
  Filled 2022-08-03: qty 2

## 2022-08-03 MED ORDER — OXYCODONE HCL 5 MG/5ML PO SOLN: 5.0000 mg | Freq: Once | ORAL | Status: AC | PRN

## 2022-08-03 MED ORDER — PHENYLEPHRINE 80 MCG/ML (10ML) SYRINGE FOR IV PUSH (FOR BLOOD PRESSURE SUPPORT)
PREFILLED_SYRINGE | INTRAVENOUS | Status: AC
  Filled 2022-08-03: qty 10

## 2022-08-03 MED ORDER — SURGIRINSE WOUND IRRIGATION SYSTEM - OPTIME
TOPICAL | Status: DC | PRN
  Administered 2022-08-03: 450 mL via TOPICAL

## 2022-08-03 MED ORDER — MIDAZOLAM HCL 2 MG/2ML IJ SOLN
INTRAMUSCULAR | Status: DC | PRN
  Administered 2022-08-03: .5 mg via INTRAVENOUS

## 2022-08-03 MED ORDER — FENTANYL CITRATE (PF) 100 MCG/2ML IJ SOLN
INTRAMUSCULAR | Status: DC | PRN
  Administered 2022-08-03: 50 ug via INTRAVENOUS

## 2022-08-03 MED ORDER — PROPOFOL 1000 MG/100ML IV EMUL
INTRAVENOUS | Status: AC
  Filled 2022-08-03: qty 100

## 2022-08-03 MED ORDER — ACETAMINOPHEN 10 MG/ML IV SOLN: 1000.0000 mg | Freq: Once | INTRAVENOUS | Status: DC | PRN

## 2022-08-03 MED ORDER — SODIUM CHLORIDE 0.9% FLUSH
3.0000 mL | Freq: Two times a day (BID) | INTRAVENOUS | Status: DC
  Administered 2022-08-03 – 2022-08-08 (×10): 3 mL via INTRAVENOUS

## 2022-08-03 MED ORDER — KETOROLAC TROMETHAMINE 15 MG/ML IJ SOLN
7.5000 mg | Freq: Four times a day (QID) | INTRAMUSCULAR | Status: AC
  Administered 2022-08-03 – 2022-08-04 (×4): 7.5 mg via INTRAVENOUS
  Filled 2022-08-03 (×4): qty 1

## 2022-08-03 MED ORDER — SODIUM CHLORIDE 0.9 % IV SOLN
INTRAVENOUS | Status: DC | PRN
  Administered 2022-08-03: .05 ug/kg/min via INTRAVENOUS

## 2022-08-03 MED ORDER — MENTHOL 3 MG MT LOZG: 1.0000 | LOZENGE | OROMUCOSAL | Status: DC | PRN

## 2022-08-03 MED ORDER — GLYCOPYRROLATE 0.2 MG/ML IJ SOLN
INTRAMUSCULAR | Status: AC
  Filled 2022-08-03: qty 1

## 2022-08-03 MED ORDER — PHENOL 1.4 % MT LIQD: 1.0000 | OROMUCOSAL | Status: DC | PRN

## 2022-08-03 MED ORDER — OXYCODONE HCL 5 MG PO TABS
5.0000 mg | ORAL_TABLET | ORAL | Status: DC | PRN
  Administered 2022-08-04 – 2022-08-06 (×4): 5 mg via ORAL
  Filled 2022-08-03 (×5): qty 1

## 2022-08-03 MED ORDER — FENTANYL CITRATE (PF) 100 MCG/2ML IJ SOLN: 25.0000 ug | INTRAMUSCULAR | Status: DC | PRN

## 2022-08-03 MED ORDER — MIDAZOLAM HCL 2 MG/2ML IJ SOLN
INTRAMUSCULAR | Status: AC
  Filled 2022-08-03: qty 2

## 2022-08-03 MED ORDER — DEXAMETHASONE SODIUM PHOSPHATE 10 MG/ML IJ SOLN
INTRAMUSCULAR | Status: AC
  Filled 2022-08-03: qty 1

## 2022-08-03 MED ORDER — REMIFENTANIL HCL 1 MG IV SOLR
INTRAVENOUS | Status: DC | PRN
  Administered 2022-08-03: 100 ug via INTRAVENOUS

## 2022-08-03 MED ORDER — SODIUM CHLORIDE 0.9 % IV SOLN: 250.0000 mL | INTRAVENOUS | Status: DC

## 2022-08-03 MED ORDER — REMIFENTANIL HCL 1 MG IV SOLR
INTRAVENOUS | Status: AC
  Filled 2022-08-03: qty 1000

## 2022-08-03 MED ORDER — ONDANSETRON HCL 4 MG PO TABS: 4.0000 mg | ORAL_TABLET | Freq: Four times a day (QID) | ORAL | Status: DC | PRN

## 2022-08-03 MED ORDER — EPINEPHRINE PF 1 MG/ML IJ SOLN
INTRAMUSCULAR | Status: AC
  Filled 2022-08-03: qty 1

## 2022-08-03 MED ORDER — DEXAMETHASONE SODIUM PHOSPHATE 10 MG/ML IJ SOLN
INTRAMUSCULAR | Status: DC | PRN
  Administered 2022-08-03: 10 mg via INTRAVENOUS

## 2022-08-03 MED ORDER — SODIUM CHLORIDE FLUSH 0.9 % IV SOLN
INTRAVENOUS | Status: AC
  Filled 2022-08-03: qty 20

## 2022-08-03 MED ORDER — METHOCARBAMOL 500 MG PO TABS
500.0000 mg | ORAL_TABLET | Freq: Four times a day (QID) | ORAL | Status: DC | PRN
  Administered 2022-08-03: 500 mg via ORAL
  Filled 2022-08-03: qty 1

## 2022-08-03 MED ORDER — ALUM & MAG HYDROXIDE-SIMETH 200-200-20 MG/5ML PO SUSP
30.0000 mL | ORAL | Status: DC | PRN
  Administered 2022-08-03: 30 mL via ORAL
  Filled 2022-08-03: qty 30

## 2022-08-03 MED ORDER — PHENYLEPHRINE HCL (PRESSORS) 10 MG/ML IV SOLN
INTRAVENOUS | Status: DC | PRN
  Administered 2022-08-03: 160 ug via INTRAVENOUS
  Administered 2022-08-03: 80 ug via INTRAVENOUS
  Administered 2022-08-03 (×2): 160 ug via INTRAVENOUS

## 2022-08-03 MED ORDER — BUPIVACAINE-EPINEPHRINE (PF) 0.5% -1:200000 IJ SOLN
INTRAMUSCULAR | Status: DC | PRN
  Administered 2022-08-03: 10 mL

## 2022-08-03 MED ORDER — METHOCARBAMOL 500 MG PO TABS
ORAL_TABLET | ORAL | Status: AC
  Filled 2022-08-03: qty 1

## 2022-08-03 MED ORDER — OXYCODONE HCL 5 MG PO TABS
5.0000 mg | ORAL_TABLET | Freq: Once | ORAL | Status: AC | PRN
  Administered 2022-08-03: 5 mg via ORAL

## 2022-08-03 MED ORDER — SODIUM CHLORIDE 0.9 % IV SOLN: INTRAVENOUS | Status: DC | PRN

## 2022-08-03 MED ORDER — PROPOFOL 10 MG/ML IV BOLUS
INTRAVENOUS | Status: DC | PRN
  Administered 2022-08-03: 150 mg via INTRAVENOUS

## 2022-08-03 MED ORDER — CEFAZOLIN SODIUM 1 G IJ SOLR
INTRAMUSCULAR | Status: AC
  Filled 2022-08-03: qty 20

## 2022-08-03 MED ORDER — PROPOFOL 10 MG/ML IV BOLUS
INTRAVENOUS | Status: AC
  Filled 2022-08-03: qty 20

## 2022-08-03 MED ORDER — SODIUM CHLORIDE 0.9% FLUSH: 3.0000 mL | INTRAVENOUS | Status: DC | PRN

## 2022-08-03 MED ORDER — LIDOCAINE HCL (CARDIAC) PF 100 MG/5ML IV SOSY
PREFILLED_SYRINGE | INTRAVENOUS | Status: DC | PRN
  Administered 2022-08-03: 80 mg via INTRAVENOUS

## 2022-08-03 MED ORDER — SODIUM CHLORIDE 0.9 % IV SOLN: INTRAVENOUS | Status: DC

## 2022-08-03 MED ORDER — ROCURONIUM BROMIDE 10 MG/ML (PF) SYRINGE
PREFILLED_SYRINGE | INTRAVENOUS | Status: AC
  Filled 2022-08-03: qty 10

## 2022-08-03 MED ORDER — OXYCODONE HCL 5 MG PO TABS: 10.0000 mg | ORAL_TABLET | ORAL | Status: DC | PRN

## 2022-08-03 MED ORDER — ACETAMINOPHEN 10 MG/ML IV SOLN
INTRAVENOUS | Status: DC | PRN
  Administered 2022-08-03: 1000 mg via INTRAVENOUS

## 2022-08-03 MED ORDER — SODIUM CHLORIDE (PF) 0.9 % IJ SOLN
INTRAMUSCULAR | Status: DC | PRN
  Administered 2022-08-03: 60 mL

## 2022-08-03 MED ORDER — PHENYLEPHRINE HCL-NACL 20-0.9 MG/250ML-% IV SOLN
INTRAVENOUS | Status: DC | PRN
  Administered 2022-08-03: 20 ug/min via INTRAVENOUS

## 2022-08-03 MED ORDER — GLYCOPYRROLATE 0.2 MG/ML IJ SOLN
INTRAMUSCULAR | Status: DC | PRN
  Administered 2022-08-03 (×2): .1 mg via INTRAVENOUS

## 2022-08-03 MED ORDER — LACTATED RINGERS IV SOLN: INTRAVENOUS | Status: DC | PRN

## 2022-08-03 MED ORDER — OXYCODONE HCL 5 MG PO TABS
ORAL_TABLET | ORAL | Status: AC
  Filled 2022-08-03: qty 1

## 2022-08-03 MED ORDER — BUPIVACAINE LIPOSOME 1.3 % IJ SUSP
INTRAMUSCULAR | Status: AC
  Filled 2022-08-03: qty 20

## 2022-08-03 MED ORDER — ONDANSETRON HCL 4 MG/2ML IJ SOLN: 4.0000 mg | Freq: Four times a day (QID) | INTRAMUSCULAR | Status: DC | PRN

## 2022-08-03 MED ORDER — SURGIFLO WITH THROMBIN (HEMOSTATIC MATRIX KIT) OPTIME
TOPICAL | Status: DC | PRN
  Administered 2022-08-03: 1 via TOPICAL

## 2022-08-03 MED ORDER — CEFAZOLIN SODIUM-DEXTROSE 2-3 GM-%(50ML) IV SOLR
INTRAVENOUS | Status: DC | PRN
  Administered 2022-08-03: 2 g via INTRAVENOUS

## 2022-08-03 MED ORDER — PROPOFOL 500 MG/50ML IV EMUL
INTRAVENOUS | Status: DC | PRN
  Administered 2022-08-03: 100 ug/kg/min via INTRAVENOUS

## 2022-08-03 SURGICAL SUPPLY — 64 items
ALLOGRAFT BONE FIBER KORE 10CC (Bone Implant) IMPLANT
BASIN KIT SINGLE STR (MISCELLANEOUS) ×1 IMPLANT
BLADE SURG 15 STRL LF DISP TIS (BLADE) ×1 IMPLANT
BLADE SURG 15 STRL SS (BLADE) ×1
BUR NEURO DRILL SOFT 3.0X3.8M (BURR) ×1 IMPLANT
CHLORAPREP W/TINT 26 (MISCELLANEOUS) ×1 IMPLANT
CUP MEDICINE 2OZ PLAST GRAD ST (MISCELLANEOUS) ×2 IMPLANT
DERMABOND ADVANCED .7 DNX12 (GAUZE/BANDAGES/DRESSINGS) ×1 IMPLANT
DRAPE C ARM PK CFD 31 SPINE (DRAPES) ×1 IMPLANT
DRAPE C-ARMOR (DRAPES) IMPLANT
DRAPE LAPAROTOMY 100X77 ABD (DRAPES) ×1 IMPLANT
DRAPE MICROSCOPE SPINE 48X150 (DRAPES) IMPLANT
DRAPE SCAN PATIENT (DRAPES) ×1 IMPLANT
DRSG OPSITE POSTOP 4X6 (GAUZE/BANDAGES/DRESSINGS) IMPLANT
ELECT CAUTERY BLADE TIP 2.5 (TIP) ×1
ELECTRODE CAUTERY BLDE TIP 2.5 (TIP) ×1 IMPLANT
EX-PIN ORTHOLOCK NAV 4X150 (PIN) IMPLANT
FEE INTRAOP CADWELL SUPPLY NCS (MISCELLANEOUS) IMPLANT
FEE INTRAOP MONITOR IMPULS NCS (MISCELLANEOUS) IMPLANT
GLOVE BIOGEL PI IND STRL 6.5 (GLOVE) ×1 IMPLANT
GLOVE BIOGEL PI IND STRL 8.5 (GLOVE) ×1 IMPLANT
GLOVE SURG SYN 6.5 ES PF (GLOVE) ×1 IMPLANT
GLOVE SURG SYN 6.5 PF PI (GLOVE) ×1 IMPLANT
GLOVE SURG SYN 8.5  E (GLOVE) ×3
GLOVE SURG SYN 8.5 E (GLOVE) ×3 IMPLANT
GLOVE SURG SYN 8.5 PF PI (GLOVE) ×3 IMPLANT
GOWN SRG LRG LVL 4 IMPRV REINF (GOWNS) ×1 IMPLANT
GOWN SRG XL LVL 3 NONREINFORCE (GOWNS) ×1 IMPLANT
GOWN STRL NON-REIN TWL XL LVL3 (GOWNS) ×1
GOWN STRL REIN LRG LVL4 (GOWNS) ×1
HEMOVAC 400CC 10FR (MISCELLANEOUS) ×1 IMPLANT
HOLDER FOLEY CATH W/STRAP (MISCELLANEOUS) ×1 IMPLANT
INTRAOP CADWELL SUPPLY FEE NCS (MISCELLANEOUS) ×1
INTRAOP DISP SUPPLY FEE NCS (MISCELLANEOUS) ×1
INTRAOP MONITOR FEE IMPULS NCS (MISCELLANEOUS) ×1
INTRAOP MONITOR FEE IMPULSE (MISCELLANEOUS) ×1
KIT PREVENA INCISION MGT 13 (CANNISTER) ×1 IMPLANT
KIT SPINAL PRONEVIEW (KITS) ×1 IMPLANT
MANIFOLD NEPTUNE II (INSTRUMENTS) ×1 IMPLANT
MARKER SKIN DUAL TIP RULER LAB (MISCELLANEOUS) ×1 IMPLANT
MARKER SPHERE PSV REFLC 13MM (MARKER) ×7 IMPLANT
NS IRRIG 1000ML POUR BTL (IV SOLUTION) ×1 IMPLANT
PACK LAMINECTOMY NEURO (CUSTOM PROCEDURE TRAY) ×1 IMPLANT
PAD ARMBOARD 7.5X6 YLW CONV (MISCELLANEOUS) ×1 IMPLANT
ROD RELINE C 3.5X40 (Rod) IMPLANT
SCREW LOCK RELINE C OPEN (Screw) IMPLANT
SCREW MA RELINE C 3.5X16 (Screw) ×4 IMPLANT
SCREW SP MA RELINE-C 3.5X16 (Screw) IMPLANT
SCREW SP MA RELINE-C 3.5X28 (Screw) IMPLANT
SCREW SPINE RELINE-C 3.5X28 (Screw) ×2 IMPLANT
SOLUTION IRRIG SURGIPHOR (IV SOLUTION) ×1 IMPLANT
STAPLER SKIN PROX 35W (STAPLE) ×2 IMPLANT
SURGIFLO W/THROMBIN 8M KIT (HEMOSTASIS) ×1 IMPLANT
SUT ETHILON 3-0 FS-10 30 BLK (SUTURE)
SUT V-LOC 90 ABS DVC 3-0 CL (SUTURE) ×1 IMPLANT
SUT VIC AB 0 CT1 27 (SUTURE) ×3
SUT VIC AB 0 CT1 27XCR 8 STRN (SUTURE) ×3 IMPLANT
SUT VIC AB 2-0 CT1 18 (SUTURE) ×2 IMPLANT
SUTURE EHLN 3-0 FS-10 30 BLK (SUTURE) IMPLANT
SYR 30ML LL (SYRINGE) ×2 IMPLANT
TAPE CLOTH 3X10 WHT NS LF (GAUZE/BANDAGES/DRESSINGS) ×2 IMPLANT
TOWEL OR 17X26 4PK STRL BLUE (TOWEL DISPOSABLE) ×2 IMPLANT
TRAP FLUID SMOKE EVACUATOR (MISCELLANEOUS) ×1 IMPLANT
TRAY FOLEY SLVR 16FR LF STAT (SET/KITS/TRAYS/PACK) ×1 IMPLANT

## 2022-08-03 NOTE — Progress Notes (Signed)
Reinforced post operative dressing, edges were rolling up. Patient symptoms are improving from before surgery, pain is improved. Dr. Myer Haff in to see patient.

## 2022-08-03 NOTE — Progress Notes (Signed)
Occupational Therapy Treatment Patient Details Name: Frank Buckley MRN: [id number] DOB: [birth year] Today's Date: 08/03/2022   History of present illness Pt is a 77 y/o M admitted on 07/30/22 after presenting with c/o weakness, fall & chest pain. Chest pain is thought to be 2/2 new finding of lung mass. PMH: acute metabolic encephalopathy, borderline diabetic, cancer, GERD   OT comments  Patient received supine in bed and agreeable to OT. He required Max A +1 to come to EOB this date. Once sitting EOB, bed linens found to be soiled. Pt required Max A +2 for static standing balance with B knee blocking while linens changed. Pt then tolerated sitting EOB for ~5 min with variable amounts of assistance provided to maintain sitting balance (Min-Max A). Posterior lean noted. He required Max A for UB dressing and grooming tasks. He required +2 to return to supine and scoot up towards HOB. Pt left as received with all needs in reach. Pt is making progress toward goal completion. D/C recommendation remains appropriate. OT will continue to follow acutely.      Recommendations for follow up therapy are one component of a multi-disciplinary discharge planning process, led by the attending physician.  Recommendations may be updated based on patient status, additional functional criteria and insurance authorization.    Assistance Recommended at Discharge Frequent or constant Supervision/Assistance  Patient can return home with the following  Two people to help with bathing/dressing/bathroom;Two people to help with walking and/or transfers;Assistance with cooking/housework;Assist for transportation;Help with stairs or ramp for entrance;Direct supervision/assist for medications management;Direct supervision/assist for financial management;Assistance with feeding   Equipment Recommendations  Other (comment) (defer to next venue of care)    Recommendations for Other Services      Precautions / Restrictions  Precautions Precautions: Fall Restrictions Weight Bearing Restrictions: No       Mobility Bed Mobility Overal bed mobility: Needs Assistance Bed Mobility: Supine to Sit, Sit to Supine     Supine to sit: Max assist, HOB elevated Sit to supine: Max assist, +2 for physical assistance   General bed mobility comments: +2 to scoot pt up towards [facility]    Transfers Overall transfer level: Needs assistance Equipment used: 2 person hand held assist Transfers: Sit to/from Stand Sit to Stand: Max assist, +2 physical assistance           General transfer comment: STS from EOB with Max A +2 via HHA, B knee blocking     Balance Overall balance assessment: Needs assistance Sitting-balance support: No upper extremity supported, Feet supported Sitting balance-Leahy Scale: Poor Sitting balance - Comments: Pt sat EOB for ~5 min with LUE support (variable levels of assistance required for sitting balance, Min-Max A) Postural control: Posterior lean Standing balance support: Bilateral upper extremity supported, During functional activity, Reliant on assistive device for balance Standing balance-Leahy Scale: Zero Standing balance comment: Max A +2 for static standing while soiled bed linens adjusted/changed       ADL either performed or assessed with clinical judgement   ADL Overall ADL's : Needs assistance/impaired Eating/Feeding: Maximal assistance;Bed level Eating/Feeding Details (indicate cue type and reason): Pt requesting water, educated on NPO for procedure. Checked with RN & cleared pt to have oral swabs for mouth, pt able to initiate movement of BUEs but ulitmately HOH assistance required to bring to mouth  Grooming: Maximal assistance;Bed level;Wash/dry face Grooming Details (indicate cue type and reason): HOH assistance, unable to reach face with B hands  Upper Body Dressing : Maximal assistance;Sitting Upper Body Dressing Details (indicate cue type and reason): to  don/doff gown     Toilet Transfer: Maximal assistance;+2 for physical assistance Toilet Transfer Details (indicate cue type and reason): simulated with STS from EOB                Extremity/Trunk Assessment Upper Extremity Assessment Upper Extremity Assessment: Generalized weakness   Lower Extremity Assessment Lower Extremity Assessment: Generalized weakness        Vision Baseline Vision/History: 1 Wears glasses Patient Visual Report: No change from baseline     Perception     Praxis      Cognition Arousal/Alertness: Awake/alert Behavior During Therapy: WFL for tasks assessed/performed Overall Cognitive Status: No family/caregiver present to determine baseline cognitive functioning     General Comments: Followed commands well        Exercises      Shoulder Instructions       General Comments      Pertinent Vitals/ Pain       Pain Assessment Pain Assessment: No/denies pain  Home Living          Prior Functioning/Environment              Frequency  Min 1X/week        Progress Toward Goals  OT Goals(current goals can now be found in the care plan section)  Progress towards OT goals: Progressing toward goals  Acute Rehab OT Goals Patient Stated Goal: get better OT Goal Formulation: With patient Time For Goal Achievement: 08/14/22 Potential to Achieve Goals: Fair  Plan Discharge plan remains appropriate;Frequency remains appropriate    Co-evaluation                 AM-PAC OT "6 Clicks" Daily Activity     Outcome Measure   Help from another person eating meals?: A Lot Help from another person taking care of personal grooming?: A Lot Help from another person toileting, which includes using toliet, bedpan, or urinal?: A Lot Help from another person bathing (including washing, rinsing, drying)?: A Lot Help from another person to put on and taking off regular upper body clothing?: A Lot Help from another person to put on and  taking off regular lower body clothing?: A Lot 6 Click Score: 12    End of Session Equipment Utilized During Treatment: Gait belt  OT Visit Diagnosis: Muscle weakness (generalized) (M62.81);Other abnormalities of gait and mobility (R26.89);History of falling (Z91.81)   Activity Tolerance Patient limited by fatigue   Patient Left in bed;with call bell/phone within reach;with bed alarm set   Nurse Communication Mobility status        Time: 1610-9604 OT Time Calculation (min): 21 min  Charges: OT General Charges $OT Visit: 1 Visit OT Treatments $Self Care/Home Management : 8-22 mins  [facility] MS, OTR/L ascom [phone]  08/03/22, 1:40 PM

## 2022-08-03 NOTE — Plan of Care (Signed)

## 2022-08-03 NOTE — Transfer of Care (Signed)
Immediate Anesthesia Transfer of Care Note  [name]  Procedure(s) Performed: POSTERIOR CERVICAL FUSION/FORAMINOTOMY LEVEL 3 APPLICATION OF INTRAOPERATIVE CT SCAN  Patient Location: PACU  Anesthesia Type:General  Level of Consciousness: drowsy  Airway & Oxygen Therapy: Patient Spontanous Breathing  Post-op Assessment: Report given to RN and Post -op Vital signs reviewed and stable  Post vital signs: Reviewed and stable  Last Vitals:  Vitals Value Taken Time  BP 123/93 08/03/22 1630  Temp 35.8 1630  Pulse 87 08/03/22 1639  Resp 21 08/03/22 1639  SpO2 95 % 08/03/22 1639  Vitals shown include unvalidated device data.  Last Pain:  Vitals:   08/03/22 1206  TempSrc: Temporal  PainSc: 0-No pain         Complications: No notable events documented.

## 2022-08-03 NOTE — Op Note (Addendum)
Indications: Mr. 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  He had severe weakness and had lost the ability to walk.  Due to rapidly worsening weakness and myelopathy, surgery was recommended.  Findings: C2-4 instrumentation  Preoperative Diagnosis: cervical myelopathy, stenosis, weakness Postoperative Diagnosis: same   EBL: 25 ml IVF: see AR ml Drains: 1 placed Disposition: Extubated and Stable to PACU Complications: none  A foley catheter was already placed.   Preoperative Note:   Risks of surgery discussed include: infection, bleeding, stroke, coma, death, paralysis, CSF leak, nerve/spinal cord injury, numbness, tingling, weakness, complex regional pain syndrome, recurrent stenosis and/or disc herniation, vascular injury, development of instability, neck/back pain, need for further surgery, persistent symptoms, development of deformity, and the risks of anesthesia. The patient understood these risks and agreed to proceed.  Operative Note:    OPERATIVE PROCEDURE:  1. Posterior Spinal Instrumentation C2-4 using Nuvasive Reline C 2. Posterolateral arthrodesis from C2 to C3  3. Use of allograft to aid in fusion 4. Use of stereotaxis 5. C2 and C3 laminectomy without facetectomy  OPERATIVE PROCEDURE:  After induction of general anesthesia, the patient was placed in the prone position on operating table on chest rolls.  The head was secured in traction with Riley Nearing tongs.  A midline incision was then planned. A timeout was performed, and antibiotics given.  Next, the posterior cervical region was prepped and draped in the usual sterile fashion. The incision was injected with local anesthetic, the opened sharply. A subperiosteal dissection was then carried out to expose the posterior elements from C2-C4.  After confirming localization, the stereotactic array was placed.  We then acquired stereotactic CT  images and register them to the patient using the BrainLab system.  Using BrainLab system, screw tracts were chosen at C2.  The high-speed drill was used to make a starting point, then the stereotactic drill guide was used to cannulate the pars bilaterally at C2.  3.5 x 28 mm screws were placed on each side.  We then used the stereotactic system that she is starting point for C3-C4 lateral mass screws.  These were then drilled using stereotactic guidance to a depth of 16 mm and 3.5 x 60 mm screws were placed at C3 and C4 bilaterally.  Rods were chosen and secured to the screws using locking caps according to manufacturer specifications.  We then brought to the stereotactic C arm back into the field and acquire a confirmatory CT scan showing appropriate placement of all implants.  And motor evoked potential was checked and was stable.  At this point, we moved onto decompression.  Using high-speed drill, the C2 and C3 lamina were removed by drilling trough laminectomies without facetectomy at C2/3.  A small portion of bone remained.  This was carefully dissected free of the dura and removed using 1 mm Kerrison punch.  The ligamentum flavum was then dissected free of the dura and removed using a 1 mm Kerrison punch.  The 1 and 2 mm Kerrison punches were then used to finalize decompression.  Monitoring was noted to be stable at this time.  After completing the decompression, the entire operative field was copiously irrigated.  High-speed drill was used to decorticate the C2-3 joint, and a mixture of allograft and autograft was placed in the C2-3 facet joint to allow for arthrodesis at C2-3.  We then moved on to closure.  Hemostasis was achieved.  A drain was placed.  The wound was closed in layers using 0, 2-0, and 3-0 vicryl, followed by staples on the skin. A sterile bandage was then placed and the patient was awakened from general anesthesia in stable condition moving all extremities.    All counts  were correct at the conclusion of the procedure.  Manning Charity PA assisted in the entire procedure. An assistant was required for this procedure due to the complexity.  The assistant provided assistance in tissue manipulation and suction, and was required for the successful and safe performance of the procedure. I performed the critical portions of the procedure.   Venetia Night MD

## 2022-08-03 NOTE — Anesthesia Preprocedure Evaluation (Addendum)
Anesthesia Evaluation  Patient identified by MRN, date of birth, ID band Patient awake    Reviewed: Allergy & Precautions, H&P , NPO status , Patient's Chart, lab work & pertinent test results  Airway Mallampati: III  TM Distance: >3 FB Neck ROM: full    Dental  (+) Edentulous Lower, Edentulous Upper   Pulmonary former smoker Lung nodules --there are known by pt's oncologist.  Current CT chest showed "Bilateral subsolid pulmonary nodules are unchanged, largest in the inferior anterior right upper lobe measuring 2.9 x 1.8 cm. 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  Difficulty walking starting April 2024  Pt with chronically elevated CK and persistent tetraparesis   Neuromuscular disease  negative psych ROS   GI/Hepatic Neg liver ROS,GERD  Controlled,,  Endo/Other  negative endocrine ROS    Renal/GU      Musculoskeletal   Abdominal Normal abdominal exam  (+)   Peds  Hematology  (+) Blood dyscrasia, anemia   Anesthesia Other Findings Past Medical History: 07/10/2022: Acute metabolic encephalopathy No date: Borderline diabetic No date: Cancer (HCC) 07/10/2022: Elevated liver enzymes No date: GERD (gastroesophageal reflux disease)  Past Surgical History: No date: CERVICAL FUSION  BMI    Body Mass Index: 21.71 kg/m      Reproductive/Obstetrics negative OB ROS                              Anesthesia Physical Anesthesia Plan  ASA: 3  Anesthesia Plan: General ETT   Post-op Pain Management: Ofirmev IV (intra-op)*, Toradol IV (intra-op)* and Regional block*   Induction: Intravenous  PONV Risk Score and Plan: 2 and Ondansetron, Dexamethasone, TIVA  and Propofol infusion  Airway Management Planned: Oral ETT  Additional Equipment:   Intra-op Plan:   Post-operative Plan: Extubation in OR  Informed Consent: I have reviewed the patients History and Physical, chart, labs and discussed the procedure including the risks, benefits and alternatives for the proposed anesthesia with the patient or authorized representative who has indicated his/her understanding and acceptance.     Dental Advisory Given  Plan Discussed with: CRNA and Surgeon  Anesthesia Plan Comments:          Anesthesia Quick Evaluation

## 2022-08-03 NOTE — Anesthesia Postprocedure Evaluation (Signed)
Anesthesia Post Note  [name]  Procedure(s) Performed: POSTERIOR CERVICAL FUSION/FORAMINOTOMY LEVEL 3 APPLICATION OF INTRAOPERATIVE CT SCAN  Patient location during evaluation: PACU Anesthesia Type: General Level of consciousness: awake and alert Pain management: pain level controlled Vital Signs Assessment: post-procedure vital signs reviewed and stable Respiratory status: spontaneous breathing, nonlabored ventilation, respiratory function stable and patient connected to nasal cannula oxygen Cardiovascular status: blood pressure returned to baseline and stable Postop Assessment: no apparent nausea or vomiting Anesthetic complications: no   No notable events documented.   Last Vitals:  Vitals:   08/03/22 1630 08/03/22 1645  BP: (!) 123/93 131/76  Pulse: 82 63  Resp: 15 16  Temp: 36.7 C   SpO2: 97% 96%    Last Pain:  Vitals:   08/03/22 1645  TempSrc:   PainSc: 0-No pain                 Louie Boston

## 2022-08-03 NOTE — Progress Notes (Signed)
PROGRESS NOTE    Frank Buckley  VHQ:[id number] DOB: July 30, 1945 DOA: 07/30/2022 PCP: Frank Sato, MD  ARPO/None  LOS: 4 days   Brief hospital course:   Assessment & Plan: 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  Pt was recently discharged from hospital on 07/14/22 to [facility] for rehab, and pt said when he was discharged home on 4/26, he couldn't walk.   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  Frank Buckley he was walking prior to last hospitalization, but became weak after his falls (likely the other way around, had falls after he became weak).  Pt was in rehab between 4/11 to 4/26, but was not able to walk by the time he was discharged home. --PT eval also found him with weakness with both arms, and pt needed assistance with feeding. --CT cervical spine no acute finding.   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  Plan: --Mgmt per Neurosurgery  --Taken for posterior cervical decompression and fusion with C2-3 decompression today --PT/OT to resume tomorrow --Resume DVT prophylaxis tomorrow  Chest pain, ACS ruled out --trop 30's flat  Non-traumatic rhabdomyolysis --CK 420, not very elevated.  Normalized CK 147. --s/p MIVF --discontinue home pravastatin   BPH (benign prostatic hyperplasia) Continue proscar.  Monitor I/O.  Lung nodules --there are known by pt's oncologist.  Current CT chest showed "Bilateral subsolid pulmonary nodules are unchanged, largest in the inferior anterior right upper lobe measuring 2.9 x 1.8 cm. 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  Sleeping, just woke briefly. Denies complaints. No acute events reported.  Per neurosurgery, surgery went well, okay to work with therapy.   Objective: Vitals:   08/03/22 0729 08/03/22 1206 08/03/22 1630 08/03/22 1645  BP: 118/72 126/65 (!) 123/93 131/76  Pulse: 64 68 82 63  Resp: 16 16 15 16   Temp: 97.7 F (36.5 C) 97.8 F (36.6 C) 98 F (36.7 C)   TempSrc:  Temporal    SpO2: 99% 97% 97% 96%  Weight:      Height:        Intake/Output Summary (Last 24 hours) at 08/03/2022 1706 Last data filed at 08/03/2022 1622 Gross per 24 hour  Intake 960 ml  Output 2375 ml  Net -1415 ml   Filed Weights   07/30/22 1330 08/02/22 0500 08/03/22 0622  Weight: 72.6 kg 69.2 kg 68.6 kg    Examination:   General exam: somnolent, woke briefly, no acute distress Respiratory system: CTAB, no wheezes, rales or rhonchi, normal respiratory effort. Cardiovascular system: normal S1/S2, RRR, no JVD, murmurs, rubs, gallops,  no pedal edema.   Gastrointestinal system: soft, NT, ND, no HSM felt, +bowel sounds. Central nervous system: exam limited by somnolence Extremities: no edema Skin: dry, intact, normal temperature Psychiatry: exam limited by somnolence    Data Reviewed: I have personally reviewed labs and imaging studies  Notable labs --- Cr 0.60, Hbg 11.5 from 10.4 Total CK normal 147   Time  spent: 35 minutes  Frank Banter, DO [facility] If 7PM-7AM, please contact night-coverage 08/03/2022, 5:06 PM

## 2022-08-03 NOTE — Anesthesia Procedure Notes (Signed)
Procedure Name: Intubation Date/Time: 08/03/2022 1:45 PM  Performed by: Jeannene Patella, CRNAPre-anesthesia Checklist: Patient identified, Patient being monitored, Timeout performed, Emergency Drugs available and Suction available Patient Re-evaluated:Patient Re-evaluated prior to induction Oxygen Delivery Method: Circle system utilized Preoxygenation: Pre-oxygenation with 100% oxygen Induction Type: IV induction Ventilation: Mask ventilation without difficulty Laryngoscope Size: 4 and McGraph Grade View: Grade I Tube type: Oral Tube size: 7.5 mm Number of attempts: 1 Airway Equipment and Method: Stylet Placement Confirmation: ETT inserted through vocal cords under direct vision, positive ETCO2 and breath sounds checked- equal and bilateral Secured at: 22 cm Tube secured with: Tape Dental Injury: Teeth and Oropharynx as per pre-operative assessment

## 2022-08-03 NOTE — Interval H&P Note (Signed)
History and Physical Interval Note:  08/03/2022 12:40 PM  Frank Buckley  has presented today for surgery, with the diagnosis of cervical myelopathy, stenosis.  The various methods of treatment have been discussed with the patient and family. After consideration of risks, benefits and other options for treatment, the patient has consented to  Procedure(s) with comments: POSTERIOR CERVICAL FUSION/FORAMINOTOMY LEVEL 3 (N/A) - C2-3 PSFD with C2-4 instrumentation APPLICATION OF INTRAOPERATIVE CT SCAN (N/A) as a surgical intervention.  The patient's history has been reviewed, patient examined, no change in status, stable for surgery.  I have reviewed the patient's chart and labs.  Questions were answered to the patient's satisfaction.    Heart sounds normal no MRG. Chest Clear to Auscultation Bilaterally.  [name]

## 2022-08-04 ENCOUNTER — Encounter: Payer: Self-pay | Admitting: Neurosurgery

## 2022-08-04 DIAGNOSIS — M4802 Spinal stenosis, cervical region: Secondary | ICD-10-CM | POA: Diagnosis not present

## 2022-08-04 DIAGNOSIS — G959 Disease of spinal cord, unspecified: Secondary | ICD-10-CM | POA: Diagnosis not present

## 2022-08-04 MED ORDER — MELATONIN 5 MG PO TABS
5.0000 mg | ORAL_TABLET | Freq: Every day | ORAL | Status: DC
  Administered 2022-08-04 – 2022-08-07 (×4): 5 mg via ORAL
  Filled 2022-08-04 (×4): qty 1

## 2022-08-04 MED ORDER — CELECOXIB 100 MG PO CAPS
100.0000 mg | ORAL_CAPSULE | Freq: Two times a day (BID) | ORAL | Status: DC
  Administered 2022-08-05 – 2022-08-08 (×7): 100 mg via ORAL
  Filled 2022-08-04 (×7): qty 1

## 2022-08-04 NOTE — Evaluation (Signed)
Physical Therapy RE-Evaluation Patient Details Name: Frank Buckley MRN: [id number] DOB: 1945-06-05 Today's Date: 08/04/2022  History of Present Illness  Pt is a 77 y/o M admitted on 07/30/22 after presenting with c/o weakness, fall & chest pain. Chest pain is thought to be 2/2 new finding of lung mass. PMH: acute metabolic encephalopathy, borderline diabetic, cancer, GERD. Pt is POD 1 s/p C2-C3 PSFD. Re-evaluation done this date  Clinical Impression  Pt is a pleasant 77 year old male who was admitted for initial weakness/fall with noted cervical myelopathy and is now POD 1 s/p C2-C3 PSFD. Re-evaluation performed this date. Pt performs bed mobility/transfers with mod assist +2 and unable to perform ambulation at this time. Still presents with increased weakness in R >L side with foot drop noted. Also reports B foot numbness with L >R side. Pt demonstrates deficits with strength/mobility/pain. Educated on cervical precautions, no brace required at this time. Would benefit from skilled PT to address above deficits and promote optimal return to PLOF. Pt will continue to receive skilled PT services while admitted and will defer to TOC/care team for updates regarding disposition planning.      Recommendations for follow up therapy are one component of a multi-disciplinary discharge planning process, led by the attending physician.  Recommendations may be updated based on patient status, additional functional criteria and insurance authorization.  Follow Up Recommendations Can patient physically be transported by private vehicle: No     Assistance Recommended at Discharge Frequent or constant Supervision/Assistance  Patient can return home with the following  Assistance with cooking/housework;Assistance with feeding;Direct supervision/assist for medications management;Direct supervision/assist for financial management;Assist for transportation;Help with stairs or ramp for entrance;Two people to help with  walking and/or transfers;Two people to help with bathing/dressing/bathroom    Equipment Recommendations  (TBD)  Recommendations for Other Services       Functional Status Assessment Patient has had a recent decline in their functional status and demonstrates the ability to make significant improvements in function in a reasonable and predictable amount of time.     Precautions / Restrictions Precautions Precautions: Fall;Cervical Precaution Booklet Issued: No Restrictions Weight Bearing Restrictions: No      Mobility  Bed Mobility Overal bed mobility: Needs Assistance Bed Mobility: Supine to Sit, Sit to Supine     Supine to sit: Mod assist, +2 for physical assistance     General bed mobility comments: once seated, able to maintain seated balance with cga and intermittent supervision. Follows commands well.    Transfers Overall transfer level: Needs assistance Equipment used: Rolling walker (2 wheels) Transfers: Bed to chair/wheelchair/BSC, Sit to/from Stand Sit to Stand: Mod assist Stand pivot transfers: Mod assist, +2 physical assistance         General transfer comment: able to stand at bedside with bracing against edge of bed and post lean. Unable to weightshift with majority of weight on L LE. R knee buckles with stepping. SPT using RW performed with +2 assist    Ambulation/Gait               General Gait Details: not safe to perform this date  Stairs            Wheelchair Mobility    Modified Rankin (Stroke Patients Only)       Balance Overall balance assessment: Needs assistance Sitting-balance support: No upper extremity supported, Feet supported Sitting balance-Leahy Scale: Fair     Standing balance support: Bilateral upper extremity supported, During functional activity, Reliant on assistive  device for balance Standing balance-Leahy Scale: Poor                               Pertinent Vitals/Pain Pain Assessment Pain  Assessment: Faces Faces Pain Scale: Hurts little more Pain Location: neck Pain Descriptors / Indicators: Sore, Grimacing Pain Intervention(s): Limited activity within patient's tolerance, Premedicated before session, Repositioned    Home Living Family/patient expects to be discharged to:: Private residence Living Arrangements: Alone Available Help at Discharge: Family;Available PRN/intermittently Type of Home: House Home Access: Stairs to enter Entrance Stairs-Rails: Right Entrance Stairs-Number of Steps: 2   Home Layout: One level Home Equipment: Agricultural consultant (2 wheels);Wheelchair - manual      Prior Function Prior Level of Function : History of Falls (last six months)             Mobility Comments: just d/c from SNF to home, unable to walk. Larey Seat out of WC at home ADLs Comments: Pt recently d/c from rehab. At rehab, pt reports completing functional mobility to the bathroom with w/c, received assistance for bathing/dressing/toileting, IND for self-feeding. He reports being able to propel himself in w/c at home but "slid out" of w/c and could not get back up.     Hand Dominance        Extremity/Trunk Assessment   Upper Extremity Assessment Upper Extremity Assessment: Generalized weakness (R UE grossly 3/5; L UE grossly 3+/5)    Lower Extremity Assessment Lower Extremity Assessment: Generalized weakness (R LE grossly 3-/5; L LE grossly 4/5; decreased DF on R ankle. Reports increased numbess on L foot)       Communication   Communication: No difficulties  Cognition Arousal/Alertness: Awake/alert Behavior During Therapy: WFL for tasks assessed/performed Overall Cognitive Status: Within Functional Limits for tasks assessed                                 General Comments: pleasant and agreeable to session. Fixated on hospital bed functions.        General Comments      Exercises Other Exercises Other Exercises: supine ther-ex performed on B LE  including SLRs, heel slides, and AP. Has increased difficulty on R LE   Assessment/Plan    PT Assessment Patient needs continued PT services  PT Problem List Decreased strength;Decreased range of motion;Decreased activity tolerance;Decreased balance;Decreased mobility;Decreased coordination;Decreased cognition;Decreased knowledge of use of DME;Decreased safety awareness;Decreased knowledge of precautions       PT Treatment Interventions DME instruction;Gait training;Stair training;Functional mobility training;Therapeutic activities;Therapeutic exercise;Balance training;Patient/family education;Cognitive remediation;Wheelchair mobility training;Neuromuscular re-education    PT Goals (Current goals can be found in the Care Plan section)  Acute Rehab PT Goals Patient Stated Goal: get better PT Goal Formulation: With patient Time For Goal Achievement: 08/18/22 Potential to Achieve Goals: Fair    Frequency Min 3X/week     Co-evaluation               AM-PAC PT "6 Clicks" Mobility  Outcome Measure Help needed turning from your back to your side while in a flat bed without using bedrails?: A Lot Help needed moving from lying on your back to sitting on the side of a flat bed without using bedrails?: A Lot Help needed moving to and from a bed to a chair (including a wheelchair)?: A Lot Help needed standing up from a chair using your arms (e.g., wheelchair or  bedside chair)?: A Lot Help needed to walk in hospital room?: Total Help needed climbing 3-5 steps with a railing? : Total 6 Click Score: 10    End of Session Equipment Utilized During Treatment: Gait belt Activity Tolerance: Patient tolerated treatment well Patient left: in chair;with chair alarm set Nurse Communication: Mobility status PT Visit Diagnosis: Difficulty in walking, not elsewhere classified (R26.2);Other abnormalities of gait and mobility (R26.89);Muscle weakness (generalized) (M62.81)    Time: 1610-9604 PT  Time Calculation (min) (ACUTE ONLY): 23 min   Charges:   PT Evaluation $PT Re-evaluation: 1 Re-eval PT Treatments $Therapeutic Exercise: 8-22 mins        Elizabeth Palau, PT, DPT, GCS [phone]   Charlea Nardo 08/04/2022, 1:41 PM

## 2022-08-04 NOTE — Discharge Instructions (Signed)
Your surgeon has performed an operation on your cervical spine (neck) to relieve pressure on the spinal cord and/or nerves.  The following are instructions to help in your recovery once you have been discharged from the hospital. Even if you feel well, it is important that you follow these activity guidelines. If you do not let your neck heal properly from the surgery, you can increase the chance of return of your symptoms and other complications.  * Do not take anti-inflammatory medications for 3 months after surgery (naproxen [Aleve], ibuprofen [Advil, Motrin], celecoxib [Celebrex], etc.). These medications can prevent your bones from healing properly.  Activity    No bending, lifting, or twisting ("BLT"). Avoid lifting objects heavier than 10 pounds (gallon milk jug).  Where possible, avoid household activities that involve lifting, bending, reaching, pushing, or pulling such as laundry, vacuuming, grocery shopping, and childcare. Try to arrange for help from friends and family for these activities while your back heals.  Increase physical activity slowly as tolerated.  Taking short walks is encouraged, but avoid strenuous exercise. Do not jog, run, bicycle, lift weights, or participate in any other exercises unless specifically allowed by your doctor.  Talk to your doctor before resuming sexual activity.  You should not drive until cleared by your doctor.  Until released by your doctor, you should not return to work or school.  You should rest at home and let your body heal.   You may shower three days after your surgery.  After showering, lightly dab your incision dry. Do not take a tub bath or go swimming until approved by your doctor at your follow-up appointment.  If your doctor ordered a cervical collar (neck brace) for you, you should wear it whenever you are out of bed. You may remove it when lying down or sleeping, but you should wear it at all other times. Not all neck surgeries  require a cervical collar.  If you smoke, we strongly recommend that you quit.  Smoking has been proven to interfere with normal bone healing and will dramatically reduce the success rate of your surgery. Please contact [facility] (800-QUIT-NOW) and use the resources at www.[facility].com for assistance in stopping smoking.  Surgical Incision   If you have a dressing on your incision, you may remove it two days after your surgery. Keep your incision area clean and dry.  If you have staples or stitches on your incision, you should have a follow up scheduled for removal. If you do not have staples or stitches, you will have steri-strips (small pieces of surgical tape) or Dermabond glue. The steri-strips/glue should begin to peel away within about a week (it is fine if the steri-strips fall off before then). If the strips are still in place one week after your surgery, you may gently remove them.  Diet           You may return to your usual diet. However, you may experience discomfort when swallowing in the first month after your surgery. This is normal. You may find that softer foods are more comfortable for you to swallow. Be sure to stay hydrated.  When to Contact Frank Buckley  You may experience pain in your neck and/or pain between your shoulder blades. This is normal and should improve in the next few weeks with the help of pain medication, muscle relaxers, and rest. Some patients report that a warm compress on the back of the neck or between the shoulder blades helps.  However, should you experience  any of the following, contact Frank Buckley immediately: New numbness or weakness Pain that is progressively getting worse, and is not relieved by your pain medication, muscle relaxers, rest, and warm compresses Bleeding, redness, swelling, pain, or drainage from surgical incision Chills or flu-like symptoms Fever greater than 101.0 F (38.3 C) Inability to eat, drink fluids, or take medications Problems with  bowel or bladder functions Difficulty breathing or shortness of breath Warmth, tenderness, or swelling in your calf Contact Information During office hours (Monday-Friday 9 am to 5 pm), please call your physician at [phone] and ask for Sharlot Gowda After hours and weekends, please call [phone] and speak with the answering service, who will contact the doctor on call.   For a life-threatening emergency, call 911

## 2022-08-04 NOTE — Progress Notes (Signed)
PROGRESS NOTE    Frank Buckley  ZOX:[id number] DOB: 05/09/1945 DOA: 07/30/2022 PCP: Frank Sato, MD  131A/131A-AA  LOS: 5 days   Brief hospital course:   Assessment & Plan: 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  Pt was recently discharged from hospital on 07/14/22 to [facility] for rehab, and pt said when he was discharged home on 4/26, he couldn't walk.   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  [name] he was walking prior to last hospitalization, but became weak after his falls (likely the other way around, had falls after he became weak).  Pt was in rehab between 4/11 to 4/26, but was not able to walk by the time he was discharged home. PT eval also found him with weakness with both arms, and pt needed assistance with feeding. CT cervical spine no acute finding.   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  5/1 -- underwent surgery (Posterolateral arthrodesis from C2 to C3, C2 and C3 laminectomy without facetectomy) Plan: --Mgmt per Neurosurgery  --PT/OT, mobilize --Resume DVT prophylaxis today --No brace needed --Monitor drain output  Chest pain, ACS ruled out --trop 30's flat  Non-traumatic rhabdomyolysis --CK 420, not very elevated.  Normalized CK 147. --s/p MIVF --discontinue home pravastatin   BPH (benign prostatic hyperplasia) Continue proscar.  Monitor I/O.  Lung nodules --there are known by pt's oncologist.  Current CT chest showed "Bilateral subsolid pulmonary nodules are unchanged, largest in the inferior anterior right upper lobe measuring 2.9 x 1.8 cm. 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 Orlie Buckley  MGUS - under surveillance --follows with Dr. Orlie Buckley   DVT prophylaxis: Lovenox SQ Code Status: Full code  Family  Communication:  Level of care: Med-Surg Dispo:   The patient is from: home Anticipated d/c is to: SNF Anticipated d/c date is: 1-2 days pending clearance by neurosurgery   Subjective and Interval History:  POD 1 --  Pt seen up in recliner this AM.  Reports improving strength, more on the left than right so far. Pain controlled okay.  No other acute complaints.  States he is interested in going to rehab.   Objective: Vitals:   08/03/22 2257 08/04/22 0359 08/04/22 0500 08/04/22 0747  BP: (!) 140/84 134/89  136/78  Pulse: 100 91  64  Resp: 20 20  17   Temp: 98.4 F (36.9 C) 98.2 F (36.8 C)  98.7 F (37.1 C)  TempSrc: Oral Oral    SpO2: 96% 100%  99%  Weight:   67 kg   Height:        Intake/Output Summary (Last 24 hours) at 08/04/2022 1243 Last data filed at 08/04/2022 8119 Gross per 24 hour  Intake 1193.09 ml  Output 1335 ml  Net -141.91 ml   Filed Weights   08/02/22 0500 08/03/22 0622 08/04/22 0500  Weight: 69.2 kg 68.6 kg 67 kg    Examination:   General exam: awake and alert, no acute distress Respiratory system: CTAB, no wheezes, rales or rhonchi, normal respiratory effort. Cardiovascular system: normal S1/S2, RRR, no pedal edema.   Gastrointestinal system: soft, NT, ND, no HSM felt, +bowel sounds. Central nervous system: normal speech, grossly non-focal Extremities: no edema Skin: dry, intact, normal temperature Psychiatry: normal mood and affect, judgement and insight appear intact    Data Reviewed: I have  personally reviewed labs and imaging studies No new labs today   Time spent: 35 minutes  Frank Banter, DO [facility] If 7PM-7AM, please contact night-coverage 08/04/2022, 12:43 PM

## 2022-08-04 NOTE — Progress Notes (Signed)
    Attending Progress Note  History: Frank Buckley is here for worsening cervical myelopathy.  08/04/22: Strength is better post-op.  He has expected neck discomfort. 08/03/22: OR for C2-3 PSFD with C2-4 instrumentation.  Physical Exam: Vitals:   08/03/22 2257 08/04/22 0359  BP: (!) 140/84 134/89  Pulse: 100 91  Resp: 20 20  Temp: 98.4 F (36.9 C) 98.2 F (36.8 C)  SpO2: 96% 100%    AA Ox3 CNI  Strength: Strength: Side Biceps Triceps Deltoid Interossei Grip Wrist Ext. Wrist Flex.  R 4- 4 4- 4- 4- 3 3  L 4 4 4 4 4 3 3     Side Iliopsoas Quads Hamstring PF DF EHL  R 4 4 4 4  4+ 4  L 4 4 4 4  4+ 4    Data:  Other tests/results: n/a  Drain 25  Assessment/Plan:  Frank Buckley has improved strength after posterior cervical decompression and fusion.    - mobilize - pain control - will transition off IV narcotics - DVT prophylaxis ok to start today - PTOT ok to start today - No brace needed - Monitor drain output   Venetia Night MD, [facility] Department of Neurosurgery

## 2022-08-04 NOTE — TOC Initial Note (Signed)
Transition of Care [facility]) - Initial/Assessment Note    Patient Details  Name: Frank Buckley MRN: [id number] Date of Birth: [birth year]  [facility] of [facility]) CM/SW Contact:    Garret Reddish, RN Phone Number: 08/04/2022, 10:02 AM  Clinical Narrative:    Chart reviewed. I have meet with patient at bedside.  Patient informs me that prior to admission he was recently discharged from [facility].  He reports that the facility sent him home and he was barely able to walk.  He reports that he discharged from the facility and had to come right back to the hospital because he was not able to walk.  He reports that he would like to discharge to a SNF in the [place] area.  He does not want to return back to [facility].  Patient lived by himself prior to going to rehab.  Patient has a wheelchair and walker at home.    Patient is agreeable to a Bed search in [place] and in [place] as his brother lives in the [place] area.    Noted that patient had Posterior cervical decompression and fusion with C2-3 decompression on 08-03-2022.    I have completed Fl 2, and will out SNF referral to [facility] and [facility] facilities.    TOC will continue to follow for discharge planning.                 Expected Discharge Plan: Skilled Nursing Facility Barriers to Discharge: No Barriers Identified   Patient Goals and CMS Choice     Choice offered to / list presented to : Patient      Expected Discharge Plan and Services   Discharge Planning Services: CM Consult Post Acute Care Choice:  (Patient would like to go to a facility in [place] if possible.  Patient does not want to return to [facility].) Living arrangements for the past 2 months: Single Family Home (Patient was recently discharged from SNF prior coming to the hospital.)                                      Prior Living Arrangements/Services Living arrangements for the past 2 months: Single Family Home  (Patient was recently discharged from SNF prior coming to the hospital.) Lives with:: Self Patient language and need for interpreter reviewed:: Yes        Need for Family Participation in Patient Care: Yes (Comment) Care giver support system in place?: Yes (comment) (Patient has supportive brothers and sisters.)      Activities of Daily Living Home Assistive Devices/Equipment: Environmental consultant (specify type), Wheelchair, Dentures (specify type) ADL Screening (condition at time of admission) Patient's cognitive ability adequate to safely complete daily activities?: No Is the patient deaf or have difficulty hearing?: No Does the patient have difficulty seeing, even when wearing glasses/contacts?: No Does the patient have difficulty concentrating, remembering, or making decisions?: Yes Patient able to express need for assistance with ADLs?: Yes Does the patient have difficulty dressing or bathing?: Yes Independently performs ADLs?: No Communication: Independent Dressing (OT): Needs assistance Is this a change from baseline?: Change from baseline, expected to last <3days Grooming: Needs assistance Is this a change from baseline?: Change from baseline, expected to last <3 days Feeding: Needs assistance Is this a change from baseline?: Change from baseline, expected to last <3 days Bathing: Needs assistance Is this a change from baseline?: Change from baseline, expected to  last <3 days Toileting: Needs assistance Is this a change from baseline?: Change from baseline, expected to last <3 days In/Out Bed: Needs assistance Walks in Home: Needs assistance Is this a change from baseline?: Change from baseline, expected to last <3 days Does the patient have difficulty walking or climbing stairs?: Yes Weakness of Legs: Both Weakness of Arms/Hands: Both  Permission Sought/Granted   Permission granted to share information with : Yes, Verbal Permission Granted              Emotional  Assessment Appearance:: Appears stated age Attitude/Demeanor/Rapport: Engaged Affect (typically observed): Appropriate Orientation: : Oriented to Self, Oriented to Place, Oriented to  Time, Oriented to Situation      Admission diagnosis:  Chest pain [R07.9] Fall, initial encounter [W19.XXXA] Chest pain, unspecified type [R07.9] Patient Active Problem List   Diagnosis Date Noted   Cervical spinal stenosis 08/02/2022   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   Myelopathy (HCC) 08/02/2022   Non-traumatic rhabdomyolysis 07/30/2022   Chest pain 07/30/2022   Cervical myelopathy (HCC) 07/12/2022   Hypoglycemia 07/11/2022   Hypokalemia 07/11/2022   Hyperlipidemia 07/10/2022   GERD (gastroesophageal reflux disease) 07/10/2022   SIRS (systemic inflammatory response syndrome) (HCC) 07/10/2022   BPH (benign prostatic hyperplasia) 07/10/2022   Left foot pain 07/10/2022   IDA (iron deficiency anemia) 06/25/2020   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 06/04/2018   MGUS (monoclonal gammopathy of unknown significance) 01/29/2018   Abnormal CT scan 01/21/2018   PCP:  Leanna Sato, MD Pharmacy:   [facility] - [place], [place] - [address] [place] [address] Phone: [phone] Fax: [phone]  [facility] [address], [place] - [address] [place] [address] Phone: [phone] Fax: [phone]     Social Determinants of Health (SDOH) Social History: SDOH Screenings   Food Insecurity: No Food Insecurity (07/31/2022)  Housing: Low Risk  (07/31/2022)  Transportation Needs: No Transportation Needs (07/31/2022)  Utilities: Not At Risk (07/31/2022)  Tobacco Use: Medium Risk (08/03/2022)   SDOH Interventions:     Readmission Risk Interventions     No data to display

## 2022-08-04 NOTE — [place] FL2 (Signed)
[place] MEDICAID FL2 LEVEL OF CARE FORM     IDENTIFICATION  Patient Name: Frank Buckley Birthdate: 09/15/1945 Sex: male Admission Date (Current Location): 07/30/2022  [facility] and [place] Number:  Chiropodist and Address:  [facility], [address] [place], [place] [address]      Provider Number: [id number]  Attending Physician Name and Address:  Pennie Banter, DO  Relative Name and Phone Number:  Tavarius Grewe [phone]    Current Level of Care: Hospital Recommended Level of Care: Skilled Nursing Facility Prior Approval Number:    Date Approved/Denied:   PASRR Number: [id number]  Discharge Plan: SNF    Current Diagnoses: Patient Active Problem List   Diagnosis Date Noted   Cervical spinal stenosis 08/02/2022   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   Myelopathy (HCC) 08/02/2022   Non-traumatic rhabdomyolysis 07/30/2022   Chest pain 07/30/2022   Cervical myelopathy (HCC) 07/12/2022   Hypoglycemia 07/11/2022   Hypokalemia 07/11/2022   Hyperlipidemia 07/10/2022   GERD (gastroesophageal reflux disease) 07/10/2022   SIRS (systemic inflammatory response syndrome) (HCC) 07/10/2022   BPH (benign prostatic hyperplasia) 07/10/2022   Left foot pain 07/10/2022   IDA (iron deficiency anemia) 06/25/2020   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 06/04/2018   MGUS (monoclonal gammopathy of unknown significance) 01/29/2018   Abnormal CT scan 01/21/2018    Orientation RESPIRATION BLADDER Height & Weight     Self, Time, Situation, Place  Normal Incontinent Weight: 67 kg Height:  5\' 10"  (177.8 cm)  BEHAVIORAL SYMPTOMS/MOOD NEUROLOGICAL BOWEL NUTRITION STATUS      Continent  (See Discharge Summary)  AMBULATORY STATUS COMMUNICATION OF NEEDS Skin   Extensive Assist Verbally Normal                       Personal Care Assistance Level of Assistance  Bathing, Feeding, Dressing Bathing Assistance: Maximum assistance Feeding assistance:  Limited assistance Dressing Assistance: Maximum assistance     Functional Limitations Info  Sight, Hearing, Speech Sight Info: Adequate Hearing Info: Adequate Speech Info: Adequate    SPECIAL CARE FACTORS FREQUENCY  PT (By licensed PT), OT (By licensed OT)     PT Frequency: 5x weekly OT Frequency: 5x weekly            Contractures Contractures Info: Not present    Additional Factors Info  Code Status, Allergies Code Status Info: Full Code Allergies Info: Penicillins           Current Medications (08/04/2022):  This is the current hospital active medication list Current Facility-Administered Medications  Medication Dose Route Frequency Provider Last Rate Last Admin   0.9 %  sodium chloride infusion  250 mL Intravenous Continuous Susanne Borders, PA       0.9 %  sodium chloride infusion   Intravenous Continuous Susanne Borders, PA 50 mL/hr at 08/03/22 1832 New Bag at 08/03/22 1832   acetaminophen (TYLENOL) tablet 650 mg  650 mg Oral Q6H PRN Susanne Borders, PA       alum & mag hydroxide-simeth (MAALOX/MYLANTA) 200-200-20 MG/5ML suspension 30 mL  30 mL Oral Q4H PRN Esaw Grandchild A, DO   30 mL at 08/03/22 2121   aspirin chewable tablet 81 mg  81 mg Oral Daily Susanne Borders, [place]   81 mg at 08/04/22 5643   [START ON 08/05/2022] celecoxib (CELEBREX) capsule 100 mg  100 mg Oral BID Venetia Night, MD       docusate sodium (COLACE)  capsule 100 mg  100 mg Oral Daily Susanne Borders, [place]   100 mg at 08/04/22 1610   doxazosin (CARDURA) tablet 8 mg  8 mg Oral Daily Susanne Borders, [place]   8 mg at 08/04/22 9604   enoxaparin (LOVENOX) injection 40 mg  40 mg Subcutaneous Q24H Susanne Borders, [place]   40 mg at 08/03/22 2121   finasteride (PROSCAR) tablet 5 mg  5 mg Oral Daily Susanne Borders, [place]   5 mg at 08/04/22 5409   ketorolac (TORADOL) 15 MG/ML injection 7.5 mg  7.5 mg Intravenous Q6H Susanne Borders, PA   7.5 mg at 08/04/22 8119   menthol-cetylpyridinium  (CEPACOL) lozenge 3 mg  1 lozenge Oral PRN Susanne Borders, PA       Or   phenol (CHLORASEPTIC) mouth spray 1 spray  1 spray Mouth/Throat PRN Susanne Borders, PA       methocarbamol (ROBAXIN) tablet 500 mg  500 mg Oral Q6H PRN Susanne Borders, PA   500 mg at 08/03/22 1728   Or   methocarbamol (ROBAXIN) 500 mg in dextrose 5 % 50 mL IVPB  500 mg Intravenous Q6H PRN Susanne Borders, PA       mirabegron [facility]) tablet 25 mg  25 mg Oral Daily Susanne Borders, [place]   25 mg at 08/04/22 1478   ondansetron (ZOFRAN) tablet 4 mg  4 mg Oral Q6H PRN Susanne Borders, PA       Or   ondansetron [facility]) injection 4 mg  4 mg Intravenous Q6H PRN Susanne Borders, PA       oxyCODONE (Oxy IR/ROXICODONE) immediate release tablet 10 mg  10 mg Oral Q3H PRN Susanne Borders, PA       oxyCODONE (Oxy IR/ROXICODONE) immediate release tablet 5 mg  5 mg Oral Q3H PRN Susanne Borders, PA       pantoprazole (PROTONIX) EC tablet 40 mg  40 mg Oral Daily Susanne Borders, PA   40 mg at 08/04/22 2956   polyethylene glycol (MIRALAX / GLYCOLAX) packet 17 g  17 g Oral BID Susanne Borders, PA   17 g at 08/04/22 2130   sodium chloride flush (NS) 0.9 % injection 3 mL  3 mL Intravenous Q12H Susanne Borders, PA   3 mL at 08/04/22 8657   sodium chloride flush (NS) 0.9 % injection 3 mL  3 mL Intravenous Q12H Susanne Borders, PA   3 mL at 08/04/22 8469   sodium chloride flush (NS) 0.9 % injection 3 mL  3 mL Intravenous PRN Susanne Borders, PA         Discharge Medications: Please see discharge summary for a list of discharge medications.  Relevant Imaging Results:  Relevant Lab Results:   Additional Information [id number]  Garret Reddish, RN

## 2022-08-05 DIAGNOSIS — G959 Disease of spinal cord, unspecified: Secondary | ICD-10-CM | POA: Diagnosis not present

## 2022-08-05 DIAGNOSIS — M4802 Spinal stenosis, cervical region: Secondary | ICD-10-CM | POA: Diagnosis not present

## 2022-08-05 LAB — BASIC METABOLIC PANEL
Anion gap: 6 (ref 5–15)
BUN: 20 mg/dL (ref 8–23)
CO2: 29 mmol/L (ref 22–32)
Calcium: 8.6 mg/dL — ABNORMAL LOW (ref 8.9–10.3)
Chloride: 101 mmol/L (ref 98–111)
Creatinine, Ser: 0.7 mg/dL (ref 0.61–1.24)
GFR, Estimated: >60 mL/min (ref >60)
Glucose, Bld: 88 mg/dL (ref 70–99)
Potassium: 4 mmol/L (ref 3.5–5.1)
Sodium: 136 mmol/L (ref 135–145)

## 2022-08-05 LAB — CBC
HCT: 33 % — ABNORMAL LOW (ref 39.0–52.0)
Hemoglobin: 11 g/dL — ABNORMAL LOW (ref 13.0–17.0)
MCH: 30.9 pg (ref 26.0–34.0)
MCHC: 33.3 g/dL (ref 30.0–36.0)
MCV: 92.7 fL (ref 80.0–100.0)
Platelets: 261 10*3/uL (ref 150–400)
RBC: 3.56 MIL/uL — ABNORMAL LOW (ref 4.22–5.81)
RDW: 12.8 % (ref 11.5–15.5)
WBC: 7.4 10*3/uL (ref 4.0–10.5)
nRBC: 0 % (ref 0.0–0.2)

## 2022-08-05 NOTE — Plan of Care (Signed)
  Problem: Pain Managment: Goal: General experience of comfort will improve Outcome: Progressing   Problem: Safety: Goal: Ability to remain free from injury will improve Outcome: Progressing   Problem: Skin Integrity: Goal: Risk for impaired skin integrity will decrease Outcome: Progressing   

## 2022-08-05 NOTE — Progress Notes (Signed)
Mobility Specialist - Progress Note   08/05/22 1531  Mobility  Activity Transferred from chair to bed  Level of Assistance Maximum assist, patient does 25-49%  Activity Response Tolerated well  $Mobility charge 1 Mobility     Pt transferred chair-bed with +2 SPT transfer. Alarm set, needs in reach.    Filiberto Pinks Mobility Specialist 08/05/22, 3:33 PM

## 2022-08-05 NOTE — Progress Notes (Signed)
Physical Therapy Treatment Patient Details Name: Frank Buckley MRN: [id number] DOB: 05/10/1945 Today's Date: 08/05/2022   History of Present Illness Pt is a 77 y/o M admitted on 07/30/22 after presenting with c/o weakness, fall & chest pain. Chest pain is thought to be 2/2 new finding of lung mass. PMH: acute metabolic encephalopathy, borderline diabetic, cancer, GERD. Pt is POD 1 s/p C2-C3 PSFD. Re-evaluation done this date    PT Comments    Patient alert, agreeable to PT, noted for soiled bed linens and gown. Seen as a PT/OT co-treat to maximize safety and function. MaxA to come to sitting EOB and extended time to reposition to assist with maintaining midline. Sit <> Stand with RW and maxAx2, pt unable to maintain R grip on RW and unable to take a step with RLE, stand pivot to recliner in room. Once in sitting he was able to perform several LE exercises to address RLE weakness. The patient would benefit from further skilled PT intervention to continue to progress towards goals.     Recommendations for follow up therapy are one component of a multi-disciplinary discharge planning process, led by the attending physician.  Recommendations may be updated based on patient status, additional functional criteria and insurance authorization.  Follow Up Recommendations  Can patient physically be transported by private vehicle: No    Assistance Recommended at Discharge Frequent or constant Supervision/Assistance  Patient can return home with the following Assistance with cooking/housework;Assistance with feeding;Direct supervision/assist for medications management;Direct supervision/assist for financial management;Assist for transportation;Help with stairs or ramp for entrance;Two people to help with walking and/or transfers;Two people to help with bathing/dressing/bathroom   Equipment Recommendations       Recommendations for Other Services OT consult     Precautions / Restrictions  Precautions Precautions: Fall;Cervical Precaution Booklet Issued: No Restrictions Weight Bearing Restrictions: No     Mobility  Bed Mobility Overal bed mobility: Needs Assistance Bed Mobility: Supine to Sit     Supine to sit: Max assist, +2 for safety/equipment          Transfers Overall transfer level: Needs assistance Equipment used: Rolling walker (2 wheels) Transfers: Bed to chair/wheelchair/BSC, Sit to/from Stand Sit to Stand: Mod assist, +2 physical assistance Stand pivot transfers: Max assist, +2 physical assistance              Ambulation/Gait               General Gait Details: unable at this time   Stairs             Wheelchair Mobility    Modified Rankin (Stroke Patients Only)       Balance Overall balance assessment: Needs assistance Sitting-balance support: No upper extremity supported, Feet supported Sitting balance-Leahy Scale: Fair     Standing balance support: Reliant on assistive device for balance Standing balance-Leahy Scale: Poor Standing balance comment: reliant on support from PT/OT to remain standing                            Cognition Arousal/Alertness: Awake/alert Behavior During Therapy: WFL for tasks assessed/performed Overall Cognitive Status: Within Functional Limits for tasks assessed                                          Exercises General Exercises - Lower Extremity Ankle Circles/Pumps: AROM, Both, 10  reps Short Arc Quad: AROM, Strengthening, Right, 10 reps Long Arc Quad: AAROM, Strengthening, Right, 10 reps Hip ABduction/ADduction: AAROM, Strengthening, Right, 10 reps    General Comments        Pertinent Vitals/Pain Pain Assessment Pain Assessment: Faces Faces Pain Scale: Hurts a little bit Pain Location: neck Pain Descriptors / Indicators: Sore, Grimacing Pain Intervention(s): Limited activity within patient's tolerance, Repositioned, Monitored during session     Home Living                          Prior Function            PT Goals (current goals can now be found in the care plan section) Progress towards PT goals: Progressing toward goals    Frequency    Min 3X/week      PT Plan Current plan remains appropriate    Co-evaluation PT/OT/SLP Co-Evaluation/Treatment: Yes Reason for Co-Treatment: For patient/therapist safety;Complexity of the patient's impairments (multi-system involvement) PT goals addressed during session: Mobility/safety with mobility;Proper use of DME;Strengthening/ROM;Balance OT goals addressed during session: ADL's and self-care      AM-PAC PT "6 Clicks" Mobility   Outcome Measure  Help needed turning from your back to your side while in a flat bed without using bedrails?: A Lot   Help needed moving to and from a bed to a chair (including a wheelchair)?: Total Help needed standing up from a chair using your arms (e.g., wheelchair or bedside chair)?: Total Help needed to walk in hospital room?: Total Help needed climbing 3-5 steps with a railing? : Total 6 Click Score: 6    End of Session Equipment Utilized During Treatment: Gait belt Activity Tolerance: Patient tolerated treatment well Patient left: with chair alarm set;in chair;with call bell/phone within reach Nurse Communication: Mobility status PT Visit Diagnosis: Difficulty in walking, not elsewhere classified (R26.2);Other abnormalities of gait and mobility (R26.89);Muscle weakness (generalized) (M62.81)     Time: 9604-5409 PT Time Calculation (min) (ACUTE ONLY): 19 min  Charges:  $Therapeutic Exercise: 8-22 mins                     Olga Coaster PT, DPT 3:24 PM,08/05/22

## 2022-08-05 NOTE — TOC Progression Note (Signed)
Transition of Care [facility]) - Progression Note    Patient Details  Name: JAMARRE GEISS MRN: [id number] Date of Birth: 1946-01-02  Transition of [facility]) CM/SW Contact  Garret Reddish, RN Phone Number: 08/05/2022, 11:41 AM  Clinical Narrative:  Bed offers reviewed Mr. Lesniewski.  He asked that I call his brother [name] and review bed offers with him.  I attempted to call [facility].  I have left a voicemail.   Noted that patient patient has surgery on 05/01 (Posterolateral arthrodesis from C2 to C3, C2 and C3 laminectomy without facetectomy).    TOC will continue follow for discharge planning.          Expected Discharge Plan: Skilled Nursing Facility Barriers to Discharge: No Barriers Identified  Expected Discharge Plan and Services   Discharge Planning Services: CM Consult Post Acute Care Choice:  (Patient would like to go to a facility in [place] if possible.  Patient does not want to return to [facility].) Living arrangements for the past 2 months: Single Family Home (Patient was recently discharged from SNF prior coming to the hospital.)                                       Social Determinants of Health (SDOH) Interventions SDOH Screenings   Food Insecurity: No Food Insecurity (07/31/2022)  Housing: Low Risk  (07/31/2022)  Transportation Needs: No Transportation Needs (07/31/2022)  Utilities: Not At Risk (07/31/2022)  Tobacco Use: Medium Risk (08/04/2022)    Readmission Risk Interventions     No data to display

## 2022-08-05 NOTE — Progress Notes (Signed)
Occupational Therapy Treatment Patient Details Name: Frank Buckley MRN: [id number] DOB: [birth year] Today's Date: 08/05/2022   History of present illness Pt is a 77 y/o M admitted on 07/30/22 after presenting with c/o weakness, fall & chest pain. Chest pain is thought to be 2/2 new finding of lung mass. PMH: acute metabolic encephalopathy, borderline diabetic, cancer, GERD. Now s/p C2-C3 PSFD.   OT comments  Pt received in bed, agreeable to therapy. Pt seen for OT/PT co-treat to maximize safety and function. MAX A for bed mobility with initial poor static sitting balance requiring repositioning, CGA-MIN A, and VC for RUE placement EOB to support midline. Pt engaged in dynamic balance activity reaching outside BOS with mild improvement noted in static sitting balance requiring SBA-CGA to maintain with RUE Support EOB. Pt able to wash his face using L hand without LOB. Sit <> Stand with RW and maxAx2, pt unable to maintain R grip on RW and unable to take a step with RLE, stand pivot to recliner in room. Pt instructed in BUE exercises from recliner. Pt progressing towards goals, continues to benefit from skilled OT services to maximize return to PLOF and minimize caregiver burden.    Recommendations for follow up therapy are one component of a multi-disciplinary discharge planning process, led by the attending physician.  Recommendations may be updated based on patient status, additional functional criteria and insurance authorization.    Assistance Recommended at Discharge Frequent or constant Supervision/Assistance  Patient can return home with the following  Two people to help with bathing/dressing/bathroom;Two people to help with walking and/or transfers;Assistance with cooking/housework;Assist for transportation;Help with stairs or ramp for entrance;Direct supervision/assist for medications management;Direct supervision/assist for financial management;Assistance with feeding   Equipment  Recommendations  Other (comment) (defer to next venue)    Recommendations for Other Services      Precautions / Restrictions Precautions Precautions: Fall;Cervical Precaution Booklet Issued: No Restrictions Weight Bearing Restrictions: No       Mobility Bed Mobility Overal bed mobility: Needs Assistance Bed Mobility: Supine to Sit     Supine to sit: Max assist, +2 for safety/equipment          Transfers Overall transfer level: Needs assistance Equipment used: Rolling walker (2 wheels) Transfers: Bed to chair/wheelchair/BSC, Sit to/from Stand Sit to Stand: Mod assist, +2 physical assistance Stand pivot transfers: Max assist, +2 physical assistance               Balance Overall balance assessment: Needs assistance Sitting-balance support: No upper extremity supported, Feet supported, Single extremity supported Sitting balance-Leahy Scale: Fair Sitting balance - Comments: initial sitting balance poor, requiring CGA-MIN A to correct for R lateral slight LOB, TC for RUE support on EOB and after dyn reaching activities improved to SBA-CGA for static sitting balance Postural control: Right lateral lean Standing balance support: Reliant on assistive device for balance, Bilateral upper extremity supported Standing balance-Leahy Scale: Poor Standing balance comment: reliant on support from PT/OT to remain standing                           ADL either performed or assessed with clinical judgement   ADL Overall ADL's : Needs assistance/impaired     Grooming: Sitting;Wash/dry face;Min guard Grooming Details (indicate cue type and reason): Pt tolerated sitting EOB with TC for R hand placement on EOB while using L hand to wash his face (decr coordination but able to grossly complete); CGA-MIN A for static sitting balance  Extremity/Trunk Assessment              Vision       Perception     Praxis       Cognition Arousal/Alertness: Awake/alert Behavior During Therapy: WFL for tasks assessed/performed Overall Cognitive Status: Within Functional Limits for tasks assessed                                          Exercises Other Exercises Other Exercises: Seated dynamic reaching outside BOS with VC for technique with mild improvement noted in static sitting balance and maintainance of midline Other Exercises: BUE ex including rows and lateral reaching bilaterally    Shoulder Instructions       General Comments      Pertinent Vitals/ Pain       Pain Assessment Pain Assessment: Faces Faces Pain Scale: Hurts a little bit Pain Location: neck Pain Descriptors / Indicators: Sore, Grimacing Pain Intervention(s): Limited activity within patient's tolerance, Monitored during session, Repositioned  Home Living                                          Prior Functioning/Environment              Frequency  Min 1X/week        Progress Toward Goals  OT Goals(current goals can now be found in the care plan section)  Progress towards OT goals: Progressing toward goals  Acute Rehab OT Goals Patient Stated Goal: get better OT Goal Formulation: With patient Time For Goal Achievement: 08/14/22 Potential to Achieve Goals: Fair  Plan Discharge plan remains appropriate;Frequency remains appropriate    Co-evaluation    PT/OT/SLP Co-Evaluation/Treatment: Yes Reason for Co-Treatment: For patient/therapist safety;Complexity of the patient's impairments (multi-system involvement) PT goals addressed during session: Mobility/safety with mobility;Proper use of DME;Strengthening/ROM;Balance OT goals addressed during session: ADL's and self-care      AM-PAC OT "6 Clicks" Daily Activity     Outcome Measure   Help from another person eating meals?: A Lot Help from another person taking care of personal grooming?: A Lot Help from another person  toileting, which includes using toliet, bedpan, or urinal?: A Lot Help from another person bathing (including washing, rinsing, drying)?: A Lot Help from another person to put on and taking off regular upper body clothing?: A Lot Help from another person to put on and taking off regular lower body clothing?: A Lot 6 Click Score: 12    End of Session Equipment Utilized During Treatment: Gait belt;Rolling walker (2 wheels)  OT Visit Diagnosis: Muscle weakness (generalized) (M62.81);Other abnormalities of gait and mobility (R26.89);History of falling (Z91.81)   Activity Tolerance Patient tolerated treatment well   Patient Left in chair;with call bell/phone within reach;with chair alarm set   Nurse Communication          Time: 1610-9604 OT Time Calculation (min): 28 min  Charges: OT General Charges $OT Visit: 1 Visit OT Treatments $Self Care/Home Management : 8-22 mins  Arman Filter., MPH, MS, OTR/L ascom [phone] 08/05/22, 3:48 PM

## 2022-08-05 NOTE — Care Management Important Message (Signed)
Important Message  Patient Details  Name: Frank Buckley MRN: [id number] Date of Birth: Oct 13, 1945   Medicare Important Message Given:  Other (see comment)  Attempted to review Medicare IM and obtain initial consent.  Patient asleep upon time of visit, no family in room.  Unable to obtain consent for Medicare IM at this time.    Johnell Comings 08/05/2022, 1:57 PM

## 2022-08-05 NOTE — Plan of Care (Signed)

## 2022-08-05 NOTE — Progress Notes (Signed)
PROGRESS NOTE    Frank Buckley  ZOX:[id number] DOB: 1945-09-26 DOA: 07/30/2022 PCP: Leanna Sato, MD  131A/131A-AA  LOS: 6 days   Brief hospital course:   Assessment & Plan: 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  Pt was recently discharged from hospital on 07/14/22 to [facility] for rehab, and pt said when he was discharged home on 4/26, he couldn't walk.   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  [name] he was walking prior to last hospitalization, but became weak after his falls (likely the other way around, had falls after he became weak).  Pt was in rehab between 4/11 to 4/26, but was not able to walk by the time he was discharged home. PT eval also found him with weakness with both arms, and pt needed assistance with feeding. CT cervical spine no acute finding.   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  5/1 -- underwent surgery (Posterolateral arthrodesis from C2 to C3, C2 and C3 laminectomy without facetectomy) Plan: --Mgmt per Neurosurgery  --PT/OT, mobilize --Resume DVT prophylaxis today --No brace needed --Monitor drain output  Chest pain, ACS ruled out --trop 30's flat  Non-traumatic rhabdomyolysis --CK 420, not very elevated.  Normalized CK 147. --s/p MIVF --discontinue home pravastatin   BPH (benign prostatic hyperplasia) Continue proscar.  Monitor I/O.  Lung nodules --there are known by pt's oncologist.  Current CT chest showed "Bilateral subsolid pulmonary nodules are unchanged, largest in the inferior anterior right upper lobe measuring 2.9 x 1.8 cm. 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 Orlie Dakin  MGUS - under surveillance --follows with Dr. Orlie Dakin   DVT prophylaxis: Lovenox SQ Code Status: Full code  Family  Communication:  Level of care: Med-Surg Dispo:   The patient is from: home Anticipated d/c is to: SNF Anticipated d/c date is: 1-2 days pending clearance by neurosurgery   Subjective and Interval History:  POD 2 --  Pt getting bed bath with NT when seen this AM. He reports feeling well. Pain controlled. Left arm strength further improved.  He notes still more weak on right side, states right leg too weak to hold him up last he tried standing.  No acute complaints.    Objective: Vitals:   08/05/22 0357 08/05/22 0427 08/05/22 0901 08/05/22 1101  BP: 114/63  100/63 106/62  Pulse: 69  86   Resp: 20  19   Temp: 98.6 F (37 C)  98.3 F (36.8 C)   TempSrc:      SpO2: 99%  98%   Weight:  67.2 kg    Height:        Intake/Output Summary (Last 24 hours) at 08/05/2022 1304 Last data filed at 08/05/2022 1252 Gross per 24 hour  Intake 360 ml  Output 2530 ml  Net -2170 ml   Filed Weights   08/03/22 0622 08/04/22 0500 08/05/22 0427  Weight: 68.6 kg 67 kg 67.2 kg    Examination:   General exam: awake and alert, no acute distress Respiratory system: CTAB, no wheezes, rales or rhonchi, normal respiratory effort. Cardiovascular system: normal S1/S2, RRR, no pedal edema.   Gastrointestinal system: soft, NT, ND, no HSM felt, +bowel sounds. Central nervous system: normal speech, grossly non-focal Extremities: no edema Skin: dry, intact, normal temperature Psychiatry: normal mood and affect, judgement and insight appear  intact    Data Reviewed: I have personally reviewed labs and imaging studies No new labs today   Time spent: 25 minutes  Pennie Banter, DO [facility] If 7PM-7AM, please contact night-coverage 08/05/2022, 1:04 PM

## 2022-08-06 DIAGNOSIS — M4802 Spinal stenosis, cervical region: Secondary | ICD-10-CM | POA: Diagnosis not present

## 2022-08-06 DIAGNOSIS — G959 Disease of spinal cord, unspecified: Secondary | ICD-10-CM | POA: Diagnosis not present

## 2022-08-06 NOTE — Progress Notes (Signed)
    Attending Progress Note  History: Frank Buckley is here for worsening cervical myelopathy.  08/06/22: Doing well.  His strength is improving. 08/04/22: Strength is better post-op.  He has expected neck discomfort. 08/03/22: OR for C2-3 PSFD with C2-4 instrumentation.  Physical Exam: Vitals:   08/06/22 0452 08/06/22 0732  BP: 100/71 107/70  Pulse: 82 75  Resp: 18 18  Temp: 98.3 F (36.8 C) 98 F (36.7 C)  SpO2: 98% 96%    AA Ox3 CNI  Strength: Strength: Side Biceps Triceps Deltoid Interossei Grip Wrist Ext. Wrist Flex.  R 4- 4 4- 4- 4 4- 3  L 4 4 4 4 4 3 3     Side Iliopsoas Quads Hamstring PF DF EHL  R 4 4 4 4  4+ 4  L 4 4 4 4  4+ 4    Data:  Other tests/results: n/a  Drain removed  Assessment/Plan:  ONEY DUTSON has improved strength after posterior cervical decompression and fusion.    - mobilize - pain control - DVT prophylaxis ok to continue - PTOT  - No brace needed    Venetia Night MD, [facility] Department of Neurosurgery

## 2022-08-06 NOTE — TOC Progression Note (Signed)
Transition of Care [facility]) - Progression Note    Patient Details  Name: Frank Buckley MRN: [id number] Date of Birth: April 06, 1945  [facility]) CM/SW Contact  Liliana Cline, LCSW Phone Number: 08/06/2022, 10:17 AM  Clinical Narrative:    CSW attempted call to patient's brother to discuss bed offers. Left VM requesting a return call.    Expected Discharge Plan: Skilled Nursing Facility Barriers to Discharge: No Barriers Identified  Expected Discharge Plan and Services   Discharge Planning Services: CM Consult Post Acute Care Choice:  (Patient would like to go to a facility in [place] if possible.  Patient does not want to return to [facility].) Living arrangements for the past 2 months: Single Family Home (Patient was recently discharged from SNF prior coming to the hospital.)                                       Social Determinants of Health (SDOH) Interventions SDOH Screenings   Food Insecurity: No Food Insecurity (07/31/2022)  Housing: Low Risk  (07/31/2022)  Transportation Needs: No Transportation Needs (07/31/2022)  Utilities: Not At Risk (07/31/2022)  Tobacco Use: Medium Risk (08/04/2022)    Readmission Risk Interventions     No data to display

## 2022-08-06 NOTE — Progress Notes (Signed)
Subjective: Improving BUE and BLE strength.   Objective: Current vital signs: BP 107/70   Pulse 75   Temp 98 F (36.7 C)   Resp 18   Ht 5\' 10"  (1.778 m)   Wt 68 kg   SpO2 96%   BMI 21.51 kg/m  Vital signs in last 24 hours: Temp:  [98 F (36.7 C)-98.5 F (36.9 C)] 98 F (36.7 C) (05/04 0732) Pulse Rate:  [75-93] 75 (05/04 0732) Resp:  [18-20] 18 (05/04 0732) BP: (100-109)/(58-71) 107/70 (05/04 0732) SpO2:  [96 %-100 %] 96 % (05/04 0732) Weight:  [68 kg] 68 kg (05/04 0421)  Intake/Output from previous day: 05/03 0701 - 05/04 0700 In: 960 [P.O.:960] Out: 2510 [Urine:2500; Drains:10] Intake/Output this shift: Total I/O In: -  Out: 210 [Urine:200; Drains:10] Nutritional status:  Diet Order             DIET DYS 2 Room service appropriate? Yes; Fluid consistency: Thin  Diet effective now                  Physical Exam HEENT- [place]/AT   Lungs- Respirations unlabored Extremities- Warm and well-perfused. Chronic wasting of first dorsal interossei is noted.    Neurological Examination Mental Status: Awake, alert and oriented. Thought content appropriate.  Speech fluent without evidence of aphasia. Dysarthria is noted in the context of being edentulous. Able to follow all commands without difficulty. Cranial Nerves: II, III,IV, VI: Fixates and tracks normally VII: Smile symmetric VIII: Hearing intact to voice IX,X: No hypophonia or hoarseness.  XI: Symmetric XII: No lingual dysarthria  Motor: Motor exam now improved postoperatively: RUE: 4-/5 deltoid, biceps, triceps and grip.   LUE: 4/5 deltoid, biceps, triceps and grip.    RLE: 3/5 HF, 4/5 knee extension and knee flexion. ADF and APF 4-/5 LLE: 4/5 HF, 4+/5 knee extension and knee flexion. ADF and APF 4+/5 Wasting of first dorsal interossei is noted bilaterally.  Sensory: Intact to FT x 4 Cerebellar: No ataxia to BUE in the context of his weakness.  Gait: Deferred  Lab Results: Results for orders placed or  performed during the hospital encounter of 07/30/22 (from the past 48 hour(s))  Basic metabolic panel     Status: Abnormal   Collection Time: 08/05/22  4:56 AM  Result Value Ref Range   Sodium 136 135 - 145 mmol/L   Potassium 4.0 3.5 - 5.1 mmol/L   Chloride 101 98 - 111 mmol/L   CO2 29 22 - 32 mmol/L   Glucose, Bld 88 70 - 99 mg/dL    Comment: Glucose reference range applies only to samples taken after fasting for at least 8 hours.   BUN 20 8 - 23 mg/dL   Creatinine, Ser 6.96 0.61 - 1.24 mg/dL   Calcium 8.6 (L) 8.9 - 10.3 mg/dL   GFR, Estimated >29 >52 mL/min    Comment: (NOTE) Calculated using the CKD-EPI Creatinine Equation (2021)    Anion gap 6 5 - 15    Comment: Performed at [facility], [address] [place], [place] [address]  CBC     Status: Abnormal   Collection Time: 08/05/22  4:56 AM  Result Value Ref Range   WBC 7.4 4.0 - 10.5 K/uL   RBC 3.56 (L) 4.22 - 5.81 MIL/uL   Hemoglobin 11.0 (L) 13.0 - 17.0 g/dL   HCT 44.0 (L) 10.2 - 72.5 %   MCV 92.7 80.0 - 100.0 fL   MCH 30.9 26.0 - 34.0 pg  MCHC 33.3 30.0 - 36.0 g/dL   RDW 16.1 09.6 - 04.5 %   Platelets 261 150 - 400 K/uL   nRBC 0.0 0.0 - 0.2 %    Comment: Performed at [facility], [address] [place], [place] [address]    No results found for this or any previous visit (from the past 240 hour(s)).  Lipid Panel No results for input(s): "CHOL", "TRIG", "HDL", "CHOLHDL", "VLDL", "LDLCALC" in the last 72 hours.  Studies/Results: No results found.  Medications: Scheduled:  aspirin  81 mg Oral Daily   celecoxib  100 mg Oral BID   docusate sodium  100 mg Oral Daily   doxazosin  8 mg Oral Daily   enoxaparin (LOVENOX) injection  40 mg Subcutaneous Q24H   finasteride  5 mg Oral Daily   melatonin  5 mg Oral QHS   mirabegron ER  25 mg Oral Daily   pantoprazole  40 mg Oral Daily   polyethylene glycol  17 g Oral BID   sodium chloride flush  3 mL Intravenous Q12H   sodium chloride flush  3 mL  Intravenous Q12H   Continuous:  sodium chloride     methocarbamol (ROBAXIN) IV     Assessment: 77 year old male with admission in early April for diffuse weakness in the setting of rhabdomyolysis. 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 On May 1 he underwent C2-3 PSFD with C2-4 instrumentation. His strength has been gradually increasing postoperatively.   - Neurology exam today reveals improved BUE and BLE weakness postoperatively. Right side is weaker than the left.   - Exam by Neurosurgery today also documents improvement.    Recommendations: - Post-op management per Neurosurgery - PT/OT - May benefit from Inpatient Rehabilitation - Neurohospitalist service will sign off. Please call if there are additional questions.    LOS: 7 days   @Electronically  signed: Dr. Caryl Pina 08/06/2022  11:59 AM

## 2022-08-06 NOTE — Plan of Care (Signed)

## 2022-08-06 NOTE — Progress Notes (Signed)
PROGRESS NOTE    Frank Buckley  WUJ:[id number] DOB: 1945-06-21 DOA: 07/30/2022 PCP: Leanna Sato, MD  131A/131A-AA  LOS: 7 days   Brief hospital course:   Assessment & Plan: 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  Pt was recently discharged from hospital on 07/14/22 to [facility] for rehab, and pt said when he was discharged home on 4/26, he couldn't walk.   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  [name] he was walking prior to last hospitalization, but became weak after his falls (likely the other way around, had falls after he became weak).  Pt was in rehab between 4/11 to 4/26, but was not able to walk by the time he was discharged home. PT eval also found him with weakness with both arms, and pt needed assistance with feeding. CT cervical spine no acute finding.   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  5/1 -- underwent surgery (Posterolateral arthrodesis from C2 to C3, C2 and C3 laminectomy without facetectomy) Plan: --Mgmt per Neurosurgery - see their recommendations --PT/OT, mobilize --DVT prophylaxis  --No brace needed --Monitor drain output  Chest pain, ACS ruled out --trop 30's flat  Non-traumatic rhabdomyolysis --CK 420, not very elevated.  Normalized CK 147. --s/p MIVF --discontinue home pravastatin   BPH (benign prostatic hyperplasia) Continue proscar.  Monitor I/O.  Lung nodules --there are known by pt's oncologist.  Current CT chest showed "Bilateral subsolid pulmonary nodules are unchanged, largest in the inferior anterior right upper lobe measuring 2.9 x 1.8 cm. 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 Orlie Dakin  MGUS - under surveillance --follows with Dr. Orlie Dakin   DVT prophylaxis: Lovenox SQ Code Status: Full code   Family Communication:  Level of care: Med-Surg Dispo:   The patient is from: home Anticipated d/c is to: SNF Anticipated d/c date is: 1-2 days pending clearance by neurosurgery   Subjective and Interval History:  Pt was sleeping comfortably this AM.  He woke briefly, denied pain or other acute complaints.   Objective: Vitals:   08/05/22 2307 08/06/22 0421 08/06/22 0452 08/06/22 0732  BP: 103/64  100/71 107/70  Pulse: 77  82 75  Resp: 20  18 18   Temp: 98.1 F (36.7 C)  98.3 F (36.8 C) 98 F (36.7 C)  TempSrc:      SpO2: 97%  98% 96%  Weight:  68 kg    Height:        Intake/Output Summary (Last 24 hours) at 08/06/2022 1101 Last data filed at 08/06/2022 0850 Gross per 24 hour  Intake 600 ml  Output 1720 ml  Net -1120 ml   Filed Weights   08/04/22 0500 08/05/22 0427 08/06/22 0421  Weight: 67 kg 67.2 kg 68 kg    Examination:   General exam: awake and alert, no acute distress Respiratory system: CTAB, no wheezes, rales or rhonchi, normal respiratory effort. Cardiovascular system: normal S1/S2, RRR, no pedal edema.   Gastrointestinal system: soft, NT, ND, no HSM felt, +bowel sounds. Central nervous system: normal speech, grossly non-focal Extremities: no edema Skin: dry, intact, normal temperature Psychiatry: normal mood and affect, judgement and insight appear intact    Data Reviewed: I have personally reviewed labs and imaging studies No new labs today   Time spent: 25 minutes  Pennie Banter, DO [facility]  If 7PM-7AM, please contact night-coverage 08/06/2022, 11:01 AM

## 2022-08-07 DIAGNOSIS — M4802 Spinal stenosis, cervical region: Secondary | ICD-10-CM | POA: Diagnosis not present

## 2022-08-07 DIAGNOSIS — G959 Disease of spinal cord, unspecified: Secondary | ICD-10-CM | POA: Diagnosis not present

## 2022-08-07 NOTE — TOC Progression Note (Addendum)
Transition of Care [facility]) - Progression Note    Patient Details  Name: Frank Buckley MRN: [id number] Date of Birth: 26-Sep-1945  Transition of Care [facility]) CM/SW Contact  Liliana Cline, LCSW Phone Number: 08/07/2022, 9:38 AM  Clinical Narrative:    Spoke to patient's brother [name] via phone. Reviewed bed offers and Medicare.gov ratings with [name]. [name] chose [facility]. CSW notified [name] at [facility]. Called [employer] to start auth, faxed clinicals.    Expected Discharge Plan: Skilled Nursing Facility Barriers to Discharge: No Barriers Identified  Expected Discharge Plan and Services   Discharge Planning Services: CM Consult Post Acute Care Choice:  (Patient would like to go to a facility in [place] if possible.  Patient does not want to return to [facility].) Living arrangements for the past 2 months: Single Family Home (Patient was recently discharged from SNF prior coming to the hospital.)                                       Social Determinants of Health (SDOH) Interventions SDOH Screenings   Food Insecurity: No Food Insecurity (07/31/2022)  Housing: Low Risk  (07/31/2022)  Transportation Needs: No Transportation Needs (07/31/2022)  Utilities: Not At Risk (07/31/2022)  Tobacco Use: Medium Risk (08/04/2022)    Readmission Risk Interventions     No data to display

## 2022-08-07 NOTE — Plan of Care (Signed)
  Problem: Fluid Volume: Goal: Hemodynamic stability will improve Outcome: Progressing   Problem: Clinical Measurements: Goal: Diagnostic test results will improve Outcome: Progressing   Problem: Education: Goal: Knowledge of General Education information will improve Description: Including pain rating scale, medication(s)/side effects and non-pharmacologic comfort measures Outcome: Progressing   Problem: Health Behavior/Discharge Planning: Goal: Ability to manage health-related needs will improve Outcome: Progressing   Problem: Clinical Measurements: Goal: Ability to maintain clinical measurements within normal limits will improve Outcome: Progressing   Problem: Activity: Goal: Risk for activity intolerance will decrease Outcome: Progressing   Problem: Nutrition: Goal: Adequate nutrition will be maintained Outcome: Progressing   Problem: Elimination: Goal: Will not experience complications related to bowel motility Outcome: Progressing

## 2022-08-07 NOTE — Progress Notes (Signed)
PROGRESS NOTE    STANISLAW MORIARITY  WUJ:[id number] DOB: Mar 21, 1946 DOA: 07/30/2022 PCP: Leanna Sato, MD  131A/131A-AA  LOS: 7 days   Brief hospital course:   Assessment & Plan: Mr. 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  Pt was recently discharged from hospital on 07/14/22 to [facility] for rehab, and pt said when he was discharged home on 4/26, he couldn't walk.   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  [name] he was walking prior to last hospitalization, but became weak after his falls (likely the other way around, had falls after he became weak).  Pt was in rehab between 4/11 to 4/26, but was not able to walk by the time he was discharged home. PT eval also found him with weakness with both arms, and pt needed assistance with feeding. CT cervical spine no acute finding.   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  5/1 -- underwent surgery (Posterolateral arthrodesis from C2 to C3, C2 and C3 laminectomy without facetectomy) Plan: --Mgmt per Neurosurgery - see their recommendations --PT/OT, mobilize --DVT prophylaxis  --No brace needed --Drain removed 08/06/22 --Follow up outpatient with Neurosurgery 08/17/22  Chest pain, ACS ruled out --trop 30's flat  Non-traumatic rhabdomyolysis --CK 420, not very elevated.  Normalized CK 147. --s/p MIVF --discontinue home pravastatin   BPH (benign prostatic hyperplasia) --Continue proscar.  --Monitor I/O.  Lung nodules --there are known by pt's oncologist.  Current CT chest showed "Bilateral subsolid pulmonary nodules are unchanged, largest in the inferior anterior right upper lobe measuring 2.9 x 1.8 cm. 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 Orlie Dakin  MGUS - under surveillance --follows with Dr. Orlie Dakin   DVT  prophylaxis: Lovenox SQ Code Status: Full code  Family Communication:  Level of care: Med-Surg Dispo:   The patient is from: home Anticipated d/c is to: SNF Anticipated d/c date is: 1-2 days pending clearance by neurosurgery   Subjective and Interval History:  Pt was awake resting in bed this AM.  Reports he is doing his exercises in the bed.  Strength continues to improve.  He hope right side strength will improve.  Eager to back up and ambulating again.  No other acute complaints.    Objective: Vitals:   08/05/22 2307 08/06/22 0421 08/06/22 0452 08/06/22 0732  BP: 103/64  100/71 107/70  Pulse: 77  82 75  Resp: 20  18 18   Temp: 98.1 F (36.7 C)  98.3 F (36.8 C) 98 F (36.7 C)  TempSrc:      SpO2: 97%  98% 96%  Weight:  68 kg    Height:        Intake/Output Summary (Last 24 hours) at 08/06/2022 1101 Last data filed at 08/06/2022 0850 Gross per 24 hour  Intake 600 ml  Output 1720 ml  Net -1120 ml   Filed Weights   08/04/22 0500 08/05/22 0427 08/06/22 0421  Weight: 67 kg 67.2 kg 68 kg    Examination:   General exam: awake and alert, no acute distress Respiratory system: CTAB, no wheezes, rales or rhonchi, normal respiratory effort. Cardiovascular system: normal S1/S2, RRR, no pedal edema.   Gastrointestinal system: soft, NT, ND, no HSM felt, +bowel sounds. Central nervous system: normal speech, grossly non-focal Extremities: no edema Skin: dry, intact, normal temperature Psychiatry: normal mood and affect,  judgement and insight appear intact    Data Reviewed: I have personally reviewed labs and imaging studies No new labs today   Time spent: 25 minutes  Pennie Banter, DO [facility] If 7PM-7AM, please contact night-coverage 08/06/2022, 11:01 AM

## 2022-08-08 LAB — BASIC METABOLIC PANEL
Anion gap: 10 (ref 5–15)
BUN: 18 mg/dL (ref 8–23)
CO2: 28 mmol/L (ref 22–32)
Calcium: 9 mg/dL (ref 8.9–10.3)
Chloride: 97 mmol/L — ABNORMAL LOW (ref 98–111)
Creatinine, Ser: 0.67 mg/dL (ref 0.61–1.24)
GFR, Estimated: >60 mL/min (ref >60)
Glucose, Bld: 97 mg/dL (ref 70–99)
Potassium: 4.1 mmol/L (ref 3.5–5.1)
Sodium: 135 mmol/L (ref 135–145)

## 2022-08-08 LAB — CBC
HCT: 36.2 % — ABNORMAL LOW (ref 39.0–52.0)
Hemoglobin: 12 g/dL — ABNORMAL LOW (ref 13.0–17.0)
MCH: 30.8 pg (ref 26.0–34.0)
MCHC: 33.1 g/dL (ref 30.0–36.0)
MCV: 93.1 fL (ref 80.0–100.0)
Platelets: 276 10*3/uL (ref 150–400)
RBC: 3.89 MIL/uL — ABNORMAL LOW (ref 4.22–5.81)
RDW: 12.3 % (ref 11.5–15.5)
WBC: 7 10*3/uL (ref 4.0–10.5)
nRBC: 0 % (ref 0.0–0.2)

## 2022-08-08 MED ORDER — ALUM & MAG HYDROXIDE-SIMETH 200-200-20 MG/5ML PO SUSP: 30.0000 mL | ORAL | 0 refills | Status: DC | PRN

## 2022-08-08 MED ORDER — POLYETHYLENE GLYCOL 3350 17 G PO PACK: 17.0000 g | PACK | Freq: Two times a day (BID) | ORAL | 0 refills | Status: DC

## 2022-08-08 MED ORDER — OXYCODONE HCL 5 MG PO TABS: 5.0000 mg | ORAL_TABLET | ORAL | 0 refills | Status: DC | PRN

## 2022-08-08 MED ORDER — ASPIRIN 81 MG PO CHEW: 81.0000 mg | CHEWABLE_TABLET | Freq: Every day | ORAL | Status: DC

## 2022-08-08 MED ORDER — CELECOXIB 100 MG PO CAPS: 100.0000 mg | ORAL_CAPSULE | Freq: Two times a day (BID) | ORAL | Status: DC

## 2022-08-08 MED ORDER — METHOCARBAMOL 500 MG PO TABS: 500.0000 mg | ORAL_TABLET | Freq: Four times a day (QID) | ORAL | Status: DC | PRN

## 2022-08-08 NOTE — Care Management Important Message (Signed)
Important Message  Patient Details  Name: Frank Buckley MRN: [id number] Date of Birth: Jul 19, 1945   Medicare Important Message Given:  Yes  Obtained consent for Medicare IM.  Original to be scanned into chart, copy left in room for reference.    Johnell Comings 08/08/2022, 1:17 PM

## 2022-08-08 NOTE — TOC Transition Note (Signed)
Transition of Care [facility]) - CM/SW Discharge Note   Patient Details  Name: Frank Buckley MRN: [id number] Date of Birth: 11/22/1945  Transition of Care [facility]) CM/SW Contact:  Garret Reddish, RN Phone Number: 08/08/2022, 12:18 PM   Clinical Narrative:   Chart reviewed.  Patient has orders for discharge today.  Patient has received SNF approval.  [facility] SNF approval number is [id number].  I have informed [name] with [facility] that patient has been approved for SNF.  Approval dates are 08-08-22- 08-10-22.  Next review date is 08-10-2022.  I have informed patient that he will be discharging to [facility] today.  I have left a voice message for patient's brother [name] informing him of patient's discharge for today.    I have sent [name] patient's SNF transfer Summary, Discharge Summary and Discharge orders.    [name] reports that patient will be going to room 702 and the number to call report is [phone].  I have made staff nurse aware of above information.      Final next level of care: Skilled Nursing Facility Barriers to Discharge: No Barriers Identified   Patient Goals and CMS Choice   Choice offered to / list presented to : Patient  Discharge Placement                Patient chooses bed at: [facility] [place] Patient to be transferred to facility by: [facility] Name of family member notified: [name] ( Patient's brother) Patient and family notified of of transfer: 08/08/22  Discharge Plan and Services Additional resources added to the After Visit Summary for     Discharge Planning Services: CM Consult Post Acute Care Choice:  (Patient would like to go to a facility in [place] if possible.  Patient does not want to return to [facility].)                               Social Determinants of Health (SDOH) Interventions SDOH Screenings   Food Insecurity: No Food Insecurity (07/31/2022)  Housing: Low Risk  (07/31/2022)  Transportation  Needs: No Transportation Needs (07/31/2022)  Utilities: Not At Risk (07/31/2022)  Tobacco Use: Medium Risk (08/04/2022)     Readmission Risk Interventions     No data to display

## 2022-08-08 NOTE — Progress Notes (Signed)
Physical Therapy Treatment Patient Details Name: Frank Buckley MRN: [id number] DOB: 1945/07/02 Today's Date: 08/08/2022   History of Present Illness Pt is a 77 y/o M admitted on 07/30/22 after presenting with c/o weakness, fall & chest pain. Chest pain is thought to be 2/2 new finding of lung mass. PMH: acute metabolic encephalopathy, borderline diabetic, cancer, GERD. Now s/p C2-C3 PSFD.    PT Comments    Pt eating breakfast on entry, assisted with reposition and setup, then author returns after meal is complete. Pt assisted with coming to EOB, balances well there with intermittent use of single UE, frequent posterior LOB, able to arrest modI until about 20 minutes in. Pt partakes at core strength, balance, motor control training at EOB >20 minutes, veyr motivated, but fatigues quickly. Attempted a STS transfer for linen adjustment, but pt unable to use arms to hold on which makes things more difficult. Ultimately given fatigued state, decided to keep pt in bed sitting up tall until later in day. Pt remains remarkably weak, unable to move legs ad lib when sitting EOB, minimal use of BUE for stability and righting.     Recommendations for follow up therapy are one component of a multi-disciplinary discharge planning process, led by the attending physician.  Recommendations may be updated based on patient status, additional functional criteria and insurance authorization.  Follow Up Recommendations  Can patient physically be transported by private vehicle: No    Assistance Recommended at Discharge Frequent or constant Supervision/Assistance  Patient can return home with the following Assistance with cooking/housework;Assistance with feeding;Direct supervision/assist for medications management;Direct supervision/assist for financial management;Assist for transportation;Help with stairs or ramp for entrance;Two people to help with walking and/or transfers;Two people to help with  bathing/dressing/bathroom   Equipment Recommendations       Recommendations for Other Services       Precautions / Restrictions Precautions Precautions: Fall Precaution Booklet Issued: No Restrictions Weight Bearing Restrictions: No     Mobility  Bed Mobility Overal bed mobility: Needs Assistance Bed Mobility: Supine to Sit, Sit to Supine   Sidelying to sit: Mod assist Supine to sit: Total assist     General bed mobility comments: once seated, able to maintain seated balance with cga and intermittent supervision. Follows commands well.    Transfers Overall transfer level: Needs assistance Equipment used: None Transfers: Bed to chair/wheelchair/BSC            Lateral/Scoot Transfers: Mod assist General transfer comment: practicing modA Rt adn Lt on EOB, needs modA of draw sheet for effective progress, then worked on A/P weightshifting thereafter to improve control and confidence    Ambulation/Gait                   Stairs             Wheelchair Mobility    Modified Rankin (Stroke Patients Only)       Balance                                            Cognition Arousal/Alertness: Awake/alert Behavior During Therapy: WFL for tasks assessed/performed Overall Cognitive Status: Within Functional Limits for tasks assessed  Exercises Other Exercises Other Exercises: Forward flexion practice for balance: hand to CL medial malleolus, alternating sides x8, then trunk fatigue overcomes    General Comments        Pertinent Vitals/Pain Pain Assessment Pain Assessment: No/denies pain    Home Living                          Prior Function            PT Goals (current goals can now be found in the care plan section) Acute Rehab PT Goals Patient Stated Goal: get better PT Goal Formulation: With patient Time For Goal Achievement: 08/18/22 Potential to  Achieve Goals: Fair Progress towards PT goals: Progressing toward goals    Frequency    Min 3X/week      PT Plan Current plan remains appropriate    Co-evaluation              AM-PAC PT "6 Clicks" Mobility   Outcome Measure  Help needed turning from your back to your side while in a flat bed without using bedrails?: A Lot Help needed moving from lying on your back to sitting on the side of a flat bed without using bedrails?: A Lot Help needed moving to and from a bed to a chair (including a wheelchair)?: Total Help needed standing up from a chair using your arms (e.g., wheelchair or bedside chair)?: Total Help needed to walk in hospital room?: Total Help needed climbing 3-5 steps with a railing? : Total 6 Click Score: 8    End of Session   Activity Tolerance: Patient tolerated treatment well;Patient limited by fatigue Patient left: with call bell/phone within reach;in bed;with bed alarm set (HOB at 48 degrees) Nurse Communication: Mobility status PT Visit Diagnosis: Difficulty in walking, not elsewhere classified (R26.2);Other abnormalities of gait and mobility (R26.89);Muscle weakness (generalized) (M62.81)     Time: 0981-1914 PT Time Calculation (min) (ACUTE ONLY): 24 min  Charges:  $Therapeutic Activity: 23-37 mins                    12:14 PM, 08/08/22 Rosamaria Lints, PT, DPT Physical Therapist - [facility]  [phone] (ASCOM)    Danissa Rundle C 08/08/2022, 12:11 PM

## 2022-08-08 NOTE — Progress Notes (Signed)
Mobility Specialist - Progress Note   08/08/22 1048  Mobility  Activity Transferred from bed to chair  Level of Assistance Moderate assist, patient does 50-74%  Assistive Device Front wheel walker  Distance Ambulated (ft) 2 ft  Activity Response Tolerated well  $Mobility charge 1 Mobility     Pt lying in bed upon arrival, utilizing RA. Pt completed bed mobility with minA. Good sitting balance. Pt STS from recliner with minA +2. VC for hand placement and corrective posture in standing. Pt SPT to the L towards recliner with modA +2. Assist for facilitating RW and cues for sequencing steps. No LOB. Slides R foot in pivot. Pt able to maintain B grip on RW during transfer. Pt left in recliner with alarm set, needs in reach.    Filiberto Pinks Mobility Specialist 08/08/22, 10:54 AM

## 2022-08-08 NOTE — Discharge Summary (Signed)
Physician Discharge Summary   [name] MRN: [id number] DOB: 1945/07/09  Admit date:     07/30/2022  Discharge date: 08/08/22  Discharge Physician: Pennie Banter   PCP: Leanna Sato, MD   Recommendations at discharge:   Follow up with Neurosurgery, scheduled  08/17/22 Follow up with Primary Care in 1-2 weeks Repeat CBC, BMP, Mg in 1-2 weeks  Discharge Diagnoses: Principal Problem:   Chest pain Active Problems:   Abnormal CT scan   GERD (gastroesophageal reflux disease)   BPH (benign prostatic hyperplasia)   Non-traumatic rhabdomyolysis   Cervical spinal stenosis   Myelomalacia (HCC)   Myelopathy (HCC)  Resolved Problems:   Acute metabolic encephalopathy   Elevated liver enzymes  Hospital Course: Frank Buckley is a 77 year old male with 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   Pt was recently discharged from hospital on 07/14/22 to [facility] for rehab, and pt said when he was discharged home on 4/26, he couldn't walk.  Further hospital course and management as outlined below.  5/6: pt doing well, progressing strength with therapy and doing bed exercises on his own.  Medically stable and agreeable to d/c to rehab today.   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  [name] he was walking prior to last hospitalization, but became weak after his falls (likely the other way around, had falls after he became weak).  Pt was in rehab between 4/11 to 4/26, but was not able to walk by the time he was discharged home. PT eval also found him with weakness with both arms, and pt needed assistance with feeding. CT cervical spine no acute finding.   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  5/1 -- underwent surgery (Posterolateral arthrodesis from C2 to C3, C2 and C3 laminectomy without  facetectomy) 5/4 -- drain removed by neurosurgery Plan: --Mgmt per Neurosurgery - see their recommendations --PT/OT, mobilize --No brace needed --Follow up outpatient with Neurosurgery 08/17/22   Chest pain, ACS ruled out --trop 30's flat   Non-traumatic rhabdomyolysis --CK 420, not very elevated.  Normalized CK 147. --s/p MIVF --discontinue home pravastatin    BPH (benign prostatic hyperplasia) --Continue proscar.  --Monitor I/O.   Lung nodules --there are known by pt's oncologist.  Current CT chest showed "Bilateral subsolid pulmonary nodules are unchanged, largest in the inferior anterior right upper lobe measuring 2.9 x 1.8 cm. 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 Orlie Dakin   MGUS - under surveillance --follows with Dr. Orlie Dakin       Consultants: Neurosurgery Procedures performed: Posterolateral arthrodesis from C2 to C3, C2 and C3 laminectomy without facetectomy   Disposition: Skilled nursing facility  Diet recommendation:  Discharge Diet Orders (From admission, onward)     Start     Ordered   08/08/22 0000  Diet - low sodium heart healthy        08/08/22 1041            DISCHARGE MEDICATION: Allergies as of 08/08/2022       Reactions   Penicillins Rash        Medication List     STOP taking these medications    ibuprofen 600 MG tablet Commonly known as: ADVIL       TAKE these medications    acetaminophen 325 MG tablet Commonly known as: TYLENOL Take  2 tablets (650 mg total) by mouth every 6 (six) hours as needed for mild pain (or Fever >/= 101).   alum & mag hydroxide-simeth 200-200-20 MG/5ML suspension Commonly known as: MAALOX/MYLANTA Take 30 mLs by mouth every 4 (four) hours as needed for indigestion.   aspirin 81 MG chewable tablet Chew 1 tablet (81 mg total) by mouth daily. Start taking on: Aug 09, 2022   celecoxib 100 MG capsule Commonly known as: CELEBREX Take 1 capsule (100 mg total)  by mouth 2 (two) times daily.   docusate sodium 100 MG capsule Commonly known as: COLACE Take 100 mg by mouth daily.   doxazosin 8 MG tablet Commonly known as: CARDURA Take 1 tablet (8 mg total) by mouth daily.   ergocalciferol 1.25 MG (50000 UT) capsule Commonly known as: VITAMIN D2 Take 1 capsule (50,000 Units total) by mouth once a week. What changed: additional instructions   finasteride 5 MG tablet Commonly known as: PROSCAR Take 1 tablet (5 mg total) by mouth daily.   Lotrimin AF Powder 2 % Aerp Generic drug: Miconazole Nitrate Apply 1 application topically 2 (two) times daily.   melatonin 3 MG Tabs tablet Take 6 mg by mouth at bedtime.   methocarbamol 500 MG tablet Commonly known as: ROBAXIN Take 1 tablet (500 mg total) by mouth every 6 (six) hours as needed for muscle spasms.   mirabegron ER 25 MG Tb24 tablet Commonly known as: MYRBETRIQ Take 1 tablet (25 mg total) by mouth daily.   omeprazole 20 MG capsule Commonly known as: PRILOSEC Take 20 mg by mouth daily.   oxyCODONE 5 MG immediate release tablet Commonly known as: Oxy IR/ROXICODONE Take 1 tablet (5 mg total) by mouth every 3 (three) hours as needed for moderate pain ((score 4 to 6)).   polyethylene glycol 17 g packet Commonly known as: MIRALAX / GLYCOLAX Take 17 g by mouth 2 (two) times daily.        Follow-up Information     Drake Leach, PA-C Follow up on 08/17/2022.   Specialty: Neurosurgery Why: neurosurgery postop follow-up Contact information: [address] [place] [address] [phone]         Leanna Sato, MD Follow up.   Specialty: Family Medicine Why: Hospital follow up in 1-2 weeks Contact information: [address] [place] [address] [phone]                Discharge Exam: Filed Weights   08/06/22 0421 08/07/22 0500 08/08/22 0553  Weight: 68 kg 70.8 kg 69 kg   General exam: awake, alert, no acute distress HEENT:  posterior surgical incision appears healthy, moist mucus membranes, hearing grossly normal  Respiratory system: CTAB, no wheezes, rales or rhonchi, normal respiratory effort. Cardiovascular system: normal S1/S2, RRR, no JVD, murmurs, rubs, gallops, no pedal edema.   Gastrointestinal system: soft, NT, ND, no HSM felt, +bowel sounds. Central nervous system: A&O x 3. normal speech Extremities: moves all, no edema, normal tone Skin: dry, intact, normal temperature Psychiatry: normal mood, congruent affect, judgement and insight appear normal   Condition at discharge: stable  The results of significant diagnostics from this hospitalization (including imaging, microbiology, ancillary and laboratory) are listed below for reference.   Imaging Studies: DG Cervical Spine 2-3 Views  Result Date: 08/03/2022 CLINICAL DATA:  C2-4 cervical fusion EXAM: Intraoperative fluoroscopy COMPARISON:  Preop MRI 08/01/2022 FINDINGS: Four fluoroscopic spot images submitted for review demonstrate placement of posterior fixation screws and vertical fixation rods from  C2 through C4. Previous anterior fusion at C3-4. Imaging was obtained to aid in treatment. Please correlate with real-time fluoroscopy of 1 minutes and 3 seconds. Cumulative dose 7.556 mGy IMPRESSION: Intraoperative fluoroscopy Electronically Signed   By: Karen Kays M.D.   On: 08/03/2022 16:18   DG C-Arm 1-60 Min-No Report  Result Date: 08/03/2022 Fluoroscopy was utilized by the requesting physician.  No radiographic interpretation.   DG C-Arm 1-60 Min-No Report  Result Date: 08/03/2022 Fluoroscopy was utilized by the requesting physician.  No radiographic interpretation.   MR BRAIN W WO CONTRAST  Result Date: 08/02/2022 CLINICAL DATA:  Demyelinating disease EXAM: MRI HEAD WITHOUT AND WITH CONTRAST MRI CERVICAL SPINE WITHOUT AND WITH CONTRAST MRI CERVICAL THORACIC WITHOUT AND CONTRAST CONTRAST:  7.39mL GADAVIST GADOBUTROL 1 MMOL/ML IV SOLN TECHNIQUE:  Multiplanar, multiecho pulse sequences of the brain and surrounding structures, and cervical and thoracic spine were obtained without and with intravenous contrast. COMPARISON:  None Available. FINDINGS: MRI HEAD FINDINGS Brain: No acute infarct, mass effect or extra-axial collection. No acute or chronic hemorrhage. There is multifocal hyperintense T2-weighted signal within the white matter. Parenchymal volume and CSF spaces are normal. The midline structures are normal. There is no abnormal contrast enhancement. Vascular: Major flow voids are preserved. Skull and upper cervical spine: Normal calvarium and skull base. Sinuses/Orbits:No paranasal sinus fluid levels or advanced mucosal thickening. No mastoid or middle ear effusion. Normal orbits. MRI CERVICAL SPINE FINDINGS Alignment: Grade 1 anterolisthesis at C4-5. Grade 1 retrolisthesis at C6-7. Grade 1 anterolisthesis at C7-T1. Vertebrae: C3-4 ACDF with solid anterior arthrodesis. Cord: Compression of the spinal cord at the C2-3 level with associated signal change. No abnormal contrast enhancement. Posterior Fossa, vertebral arteries, paraspinal tissues: Unremarkable Disc levels: C1-2: Unremarkable. C2-3: Disc bulge and facet hypertrophy. Severe spinal canal stenosis. Severe bilateral neural foraminal stenosis. C3-4: Solid anterior arthrodesis. There is no spinal canal stenosis. Mild bilateral neural foraminal stenosis. C4-5: Small disc bulge with mild facet hypertrophy. There is no spinal canal stenosis. Mild right and moderate left neural foraminal stenosis. C5-6: Disc bulge with uncovertebral hypertrophy. Mild spinal canal stenosis. Mild right and severe left neural foraminal stenosis. C6-7: Small disc bulge. No spinal canal stenosis. Mild bilateral neural foraminal stenosis. C7-T1: Grade 1 anterolisthesis due to facet arthrosis. Mild spinal canal stenosis. Severe bilateral neural foraminal stenosis. MRI THORACIC SPINE FINDINGS Alignment:  Physiologic.  Vertebrae: No fracture, evidence of discitis, or bone lesion. Cord:  Normal signal and morphology. Paraspinal and other soft tissues: Small pleural effusions. Disc levels: No spinal canal stenosis IMPRESSION: 1. 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 2. No demyelinating lesions of the spinal cord. 3. Multilevel moderate-to-severe cervical neural foraminal stenosis. 4. Small pleural effusions. Critical Value/emergent results were called by telephone at the time of interpretation on 08/02/2022 at 3:15 am to provider [facility], who verbally acknowledged these results. Electronically Signed   By: Deatra Robinson M.D.   On: 08/02/2022 03:16   MR THORACIC SPINE W WO CONTRAST  Result Date: 08/02/2022 CLINICAL DATA:  Demyelinating disease EXAM: MRI HEAD WITHOUT AND WITH CONTRAST MRI CERVICAL SPINE WITHOUT AND WITH CONTRAST MRI CERVICAL THORACIC WITHOUT AND CONTRAST CONTRAST:  7.71mL GADAVIST GADOBUTROL 1 MMOL/ML IV SOLN TECHNIQUE: Multiplanar, multiecho pulse sequences of the brain and surrounding structures, and cervical and thoracic spine were obtained without and with intravenous contrast. COMPARISON:  None Available. FINDINGS: MRI HEAD FINDINGS Brain: No acute infarct,  mass effect or extra-axial collection. No acute or chronic hemorrhage. There is multifocal hyperintense T2-weighted signal within the white matter. Parenchymal volume and CSF spaces are normal. The midline structures are normal. There is no abnormal contrast enhancement. Vascular: Major flow voids are preserved. Skull and upper cervical spine: Normal calvarium and skull base. Sinuses/Orbits:No paranasal sinus fluid levels or advanced mucosal thickening. No mastoid or middle ear effusion. Normal orbits. MRI CERVICAL SPINE FINDINGS Alignment: Grade 1 anterolisthesis at C4-5. Grade 1 retrolisthesis at C6-7. Grade 1 anterolisthesis at C7-T1.  Vertebrae: C3-4 ACDF with solid anterior arthrodesis. Cord: Compression of the spinal cord at the C2-3 level with associated signal change. No abnormal contrast enhancement. Posterior Fossa, vertebral arteries, paraspinal tissues: Unremarkable Disc levels: C1-2: Unremarkable. C2-3: Disc bulge and facet hypertrophy. Severe spinal canal stenosis. Severe bilateral neural foraminal stenosis. C3-4: Solid anterior arthrodesis. There is no spinal canal stenosis. Mild bilateral neural foraminal stenosis. C4-5: Small disc bulge with mild facet hypertrophy. There is no spinal canal stenosis. Mild right and moderate left neural foraminal stenosis. C5-6: Disc bulge with uncovertebral hypertrophy. Mild spinal canal stenosis. Mild right and severe left neural foraminal stenosis. C6-7: Small disc bulge. No spinal canal stenosis. Mild bilateral neural foraminal stenosis. C7-T1: Grade 1 anterolisthesis due to facet arthrosis. Mild spinal canal stenosis. Severe bilateral neural foraminal stenosis. MRI THORACIC SPINE FINDINGS Alignment:  Physiologic. Vertebrae: No fracture, evidence of discitis, or bone lesion. Cord:  Normal signal and morphology. Paraspinal and other soft tissues: Small pleural effusions. Disc levels: No spinal canal stenosis IMPRESSION: 1. 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 2. No demyelinating lesions of the spinal cord. 3. Multilevel moderate-to-severe cervical neural foraminal stenosis. 4. Small pleural effusions. Critical Value/emergent results were called by telephone at the time of interpretation on 08/02/2022 at 3:15 am to provider [facility], who verbally acknowledged these results. Electronically Signed   By: Deatra Robinson M.D.   On: 08/02/2022 03:16   MR CERVICAL SPINE W WO CONTRAST  Result Date: 08/02/2022 CLINICAL DATA:  Demyelinating disease EXAM: MRI HEAD WITHOUT AND WITH CONTRAST MRI  CERVICAL SPINE WITHOUT AND WITH CONTRAST MRI CERVICAL THORACIC WITHOUT AND CONTRAST CONTRAST:  7.63mL GADAVIST GADOBUTROL 1 MMOL/ML IV SOLN TECHNIQUE: Multiplanar, multiecho pulse sequences of the brain and surrounding structures, and cervical and thoracic spine were obtained without and with intravenous contrast. COMPARISON:  None Available. FINDINGS: MRI HEAD FINDINGS Brain: No acute infarct, mass effect or extra-axial collection. No acute or chronic hemorrhage. There is multifocal hyperintense T2-weighted signal within the white matter. Parenchymal volume and CSF spaces are normal. The midline structures are normal. There is no abnormal contrast enhancement. Vascular: Major flow voids are preserved. Skull and upper cervical spine: Normal calvarium and skull base. Sinuses/Orbits:No paranasal sinus fluid levels or advanced mucosal thickening. No mastoid or middle ear effusion. Normal orbits. MRI CERVICAL SPINE FINDINGS Alignment: Grade 1 anterolisthesis at C4-5. Grade 1 retrolisthesis at C6-7. Grade 1 anterolisthesis at C7-T1. Vertebrae: C3-4 ACDF with solid anterior arthrodesis. Cord: Compression of the spinal cord at the C2-3 level with associated signal change. No abnormal contrast enhancement. Posterior Fossa, vertebral arteries, paraspinal tissues: Unremarkable Disc levels: C1-2: Unremarkable. C2-3: Disc bulge and facet hypertrophy. Severe spinal canal stenosis. Severe bilateral neural foraminal stenosis. C3-4: Solid anterior arthrodesis. There is no spinal canal stenosis. Mild bilateral neural foraminal stenosis. C4-5: Small disc bulge with mild facet hypertrophy. There is no spinal canal stenosis.  Mild right and moderate left neural foraminal stenosis. C5-6: Disc bulge with uncovertebral hypertrophy. Mild spinal canal stenosis. Mild right and severe left neural foraminal stenosis. C6-7: Small disc bulge. No spinal canal stenosis. Mild bilateral neural foraminal stenosis. C7-T1: Grade 1 anterolisthesis due to  facet arthrosis. Mild spinal canal stenosis. Severe bilateral neural foraminal stenosis. MRI THORACIC SPINE FINDINGS Alignment:  Physiologic. Vertebrae: No fracture, evidence of discitis, or bone lesion. Cord:  Normal signal and morphology. Paraspinal and other soft tissues: Small pleural effusions. Disc levels: No spinal canal stenosis IMPRESSION: 1. 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 2. No demyelinating lesions of the spinal cord. 3. Multilevel moderate-to-severe cervical neural foraminal stenosis. 4. Small pleural effusions. Critical Value/emergent results were called by telephone at the time of interpretation on 08/02/2022 at 3:15 am to provider [facility], who verbally acknowledged these results. Electronically Signed   By: Deatra Robinson M.D.   On: 08/02/2022 03:16   US SCROTUM W/DOPPLER  Result Date: 07/30/2022 CLINICAL DATA:  Bilateral chronic testicular pain EXAM: SCROTAL ULTRASOUND DOPPLER ULTRASOUND OF THE TESTICLES TECHNIQUE: Complete ultrasound examination of the testicles, epididymis, and other scrotal structures was performed. Color and spectral Doppler ultrasound were also utilized to evaluate blood flow to the testicles. COMPARISON:  None Available. FINDINGS: Right testicle Measurements: 4.2 x 1.8 x 2.8 cm. Scattered calcifications are noted. No focal mass is seen. Left testicle Measurements: 4.5 x 1.8 x 2.3 cm. Small 3 mm testicular cyst is noted. Right epididymis:  6 mm epididymal cyst is noted. Left epididymis: Multiple epididymal cysts are noted measuring up to 6 mm. Hydrocele:  None visualized. Varicocele:  None visualized. Pulsed Doppler interrogation of both testes demonstrates normal low resistance arterial and venous waveforms bilaterally. IMPRESSION: Bilateral epididymal cysts. Small testicular cyst on the left. No evidence of testicular mass or torsion. Electronically  Signed   By: Alcide Clever M.D.   On: 07/30/2022 19:35   CT Head Wo Contrast  Result Date: 07/30/2022 CLINICAL DATA:  Unwitnessed fall, multiple recent falls EXAM: CT HEAD WITHOUT CONTRAST CT CERVICAL SPINE WITHOUT CONTRAST TECHNIQUE: Multidetector CT imaging of the head and cervical spine was performed following the standard protocol without intravenous contrast. Multiplanar CT image reconstructions of the cervical spine were also generated. RADIATION DOSE REDUCTION: This exam was performed according to the departmental dose-optimization program which includes automated exposure control, adjustment of the mA and/or kV according to patient size and/or use of iterative reconstruction technique. COMPARISON:  07/10/2022 FINDINGS: CT HEAD FINDINGS Brain: No evidence of acute infarction, hemorrhage, hydrocephalus, extra-axial collection or mass lesion/mass effect. Mild periventricular white matter hypodensity. Vascular: No hyperdense vessel or unexpected calcification. Skull: Normal. Negative for fracture or focal lesion. Sinuses/Orbits: No acute finding. Other: None. CT CERVICAL SPINE FINDINGS Alignment: Normal. Skull base and vertebrae: No acute fracture. No primary bone lesion or focal pathologic process. Soft tissues and spinal canal: No prevertebral fluid or swelling. No visible canal hematoma. Disc levels: Anterior cervical discectomy and fusion of C3-C4 with bony incorporation. Severe disc space height loss and osteophytosis of the remaining cervical levels, worst at C5-6. Upper chest: Negative. Other: None. IMPRESSION: 1. No acute intracranial pathology. Small-vessel white matter disease. 2. No fracture or static subluxation of the cervical spine. 3. Anterior cervical discectomy and fusion of C3-C4 with bony incorporation. Severe disc space height loss and osteophytosis of the remaining cervical levels, worst at C5-C6. Electronically Signed  By: Jearld Lesch M.D.   On: 07/30/2022 18:27   CT Cervical Spine  Wo Contrast  Result Date: 07/30/2022 CLINICAL DATA:  Unwitnessed fall, multiple recent falls EXAM: CT HEAD WITHOUT CONTRAST CT CERVICAL SPINE WITHOUT CONTRAST TECHNIQUE: Multidetector CT imaging of the head and cervical spine was performed following the standard protocol without intravenous contrast. Multiplanar CT image reconstructions of the cervical spine were also generated. RADIATION DOSE REDUCTION: This exam was performed according to the departmental dose-optimization program which includes automated exposure control, adjustment of the mA and/or kV according to patient size and/or use of iterative reconstruction technique. COMPARISON:  07/10/2022 FINDINGS: CT HEAD FINDINGS Brain: No evidence of acute infarction, hemorrhage, hydrocephalus, extra-axial collection or mass lesion/mass effect. Mild periventricular white matter hypodensity. Vascular: No hyperdense vessel or unexpected calcification. Skull: Normal. Negative for fracture or focal lesion. Sinuses/Orbits: No acute finding. Other: None. CT CERVICAL SPINE FINDINGS Alignment: Normal. Skull base and vertebrae: No acute fracture. No primary bone lesion or focal pathologic process. Soft tissues and spinal canal: No prevertebral fluid or swelling. No visible canal hematoma. Disc levels: Anterior cervical discectomy and fusion of C3-C4 with bony incorporation. Severe disc space height loss and osteophytosis of the remaining cervical levels, worst at C5-6. Upper chest: Negative. Other: None. IMPRESSION: 1. No acute intracranial pathology. Small-vessel white matter disease. 2. No fracture or static subluxation of the cervical spine. 3. Anterior cervical discectomy and fusion of C3-C4 with bony incorporation. Severe disc space height loss and osteophytosis of the remaining cervical levels, worst at C5-C6. Electronically Signed   By: Jearld Lesch M.D.   On: 07/30/2022 18:27   CT Angio Chest/Abd/Pel for Dissection W and/or Wo Contrast  Result Date:  07/30/2022 CLINICAL DATA:  Acute aortic syndrome suspected unwitnessed fall from wheelchair, multiple recent falls, lung masses * Tracking Code: BO * EXAM: CT ANGIOGRAPHY CHEST, ABDOMEN AND PELVIS TECHNIQUE: Non-contrast CT of the chest was initially obtained. Multidetector CT imaging through the chest, abdomen and pelvis was performed using the standard protocol during bolus administration of intravenous contrast. Multiplanar reconstructed images and MIPs were obtained and reviewed to evaluate the vascular anatomy. RADIATION DOSE REDUCTION: This exam was performed according to the departmental dose-optimization program which includes automated exposure control, adjustment of the mA and/or kV according to patient size and/or use of iterative reconstruction technique. CONTRAST:  OMNIPAQUE IOHEXOL 350 MG/ML SOLN COMPARISON:  CT chest, 03/07/2022 FINDINGS: CTA CHEST FINDINGS VASCULAR Aorta: Satisfactory opacification of the aorta. Normal contour and caliber of the thoracic aorta. No evidence of aneurysm, dissection, or other acute aortic pathology. Scattered aortic atherosclerosis Cardiovascular: No evidence of pulmonary embolism on limited non-tailored examination. Normal heart size. No pericardial effusion. Review of the MIP images confirms the above findings. NON VASCULAR Mediastinum/Nodes: No enlarged mediastinal, hilar, or axillary lymph nodes. Thyroid gland, trachea, and esophagus demonstrate no significant findings. Lungs/Pleura: Mild centrilobular emphysema. Bilateral subsolid pulmonary nodules are unchanged, largest in the inferior anterior right upper lobe measuring 2.9 x 1.8 cm (series 10, image 77). Dependent bibasilar scarring and or atelectasis. No pleural effusion or pneumothorax. Musculoskeletal: No chest wall abnormality. No acute osseous findings. Review of the MIP images confirms the above findings. CTA ABDOMEN AND PELVIS FINDINGS VASCULAR Normal contour and caliber of the abdominal aorta. No  evidence of aneurysm, dissection, or other acute aortic pathology. Standard branching pattern of the abdominal aorta with solitary bilateral renal arteries. Mild mixed calcific atherosclerosis. Review of the MIP images confirms the above findings. NON-VASCULAR Hepatobiliary: No solid  liver abnormality is seen. Unchanged, benign subcapsular hemangioma of the posterior right lobe of the liver, hepatic segment VII (series 8, image 119). No gallstones, gallbladder wall thickening, or biliary dilatation. Pancreas: Unremarkable. No pancreatic ductal dilatation or surrounding inflammatory changes. Spleen: Normal in size without significant abnormality. Adrenals/Urinary Tract: Adrenal glands are unremarkable. Kidneys are normal, without renal calculi, solid lesion, or hydronephrosis. Bladder is unremarkable. Stomach/Bowel: Stomach is within normal limits. Appendix appears normal. No evidence of bowel wall thickening, distention, or inflammatory changes. Colonic diverticulosis. Lymphatic: No enlarged abdominal or pelvic lymph nodes. Reproductive: No mass or other significant abnormality. Other: No abdominal wall hernia or abnormality. No ascites. Musculoskeletal: No acute osseous findings. IMPRESSION: 1. Normal contour and caliber of the thoracic and abdominal aorta. No evidence of aneurysm, dissection, or other acute aortic pathology. Mild mixed calcific atherosclerosis. 2. Bilateral subsolid pulmonary nodules are unchanged, largest in the inferior anterior right upper lobe measuring 2.9 x 1.8 cm. 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 3. Mild emphysema. 4. Colonic diverticulosis without evidence of acute diverticulitis. Aortic Atherosclerosis (ICD10-I70.0) and Emphysema (ICD10-J43.9). Electronically Signed   By: Jearld Lesch M.D.   On: 07/30/2022 18:23   DG Chest 2 View  Result Date: 07/30/2022 CLINICAL DATA:  Weakness and unwitnessed falls. EXAM: CHEST - 2 VIEW COMPARISON:  July 10, 2022 FINDINGS:  Tortuosity of the aorta. Cardiomediastinal silhouette is normal. Mediastinal contours appear intact. There is no evidence of focal airspace consolidation, pleural effusion or pneumothorax. Chronic elevation left hemidiaphragm. Osteoarthritic changes of the left shoulder. Soft tissues are grossly normal. IMPRESSION: 1. No active cardiopulmonary disease. 2. Tortuosity of the aorta. Electronically Signed   By: Ted Mcalpine M.D.   On: 07/30/2022 14:18   US Abdomen Limited RUQ (LIVER/GB)  Result Date: 07/10/2022 CLINICAL DATA:  Altered mental status EXAM: ULTRASOUND ABDOMEN LIMITED RIGHT UPPER QUADRANT COMPARISON:  Same day CT of the abdomen and pelvis. FINDINGS: Gallbladder: No gallstones or wall thickening visualized. Sludge seen in the gallbladder. No sonographic Murphy sign noted by sonographer. Common bile duct: Diameter: 4.6 mm Liver: Hyperechoic mass of the posterior right lobe of the liver measuring 2.7 x 2.5 x 2.3 cm, consistent with benign hemangioma when compared with same day CT. Hepatic cyst adjacent to the gallbladder measuring up to 0.6 cm. Increased parenchymal echogenicity. Portal vein is patent on color Doppler imaging with normal direction of blood flow towards the liver. Other: None. IMPRESSION: 1. Hepatic steatosis. 2. Sludge seen in the gallbladder. No sonographic evidence of acute cholecystitis. Electronically Signed   By: Allegra Lai M.D.   On: 07/10/2022 21:53   DG Foot 2 Views Left  Result Date: 07/10/2022 CLINICAL DATA:  [address] Foot pain [address] EXAM: LEFT FOOT - 2 VIEW COMPARISON:  None Available. FINDINGS: No acute fracture or dislocation. Tiny enthesophyte of the Achilles tendon. Mild degenerative changes of the first MTP. Scattered degenerative changes of the toes. No area of erosion or osseous destruction. There is a 2 mm linear radiopaque density projecting over the lateral aspect of the second toe, nonspecific. Soft tissues are unremarkable. IMPRESSION: 1. No acute  fracture or dislocation. 2. 2 mm linear radiopaque density projecting over the lateral aspect of the second toe, nonspecific. Recommend correlation with physical exam. Electronically Signed   By: Meda Klinefelter M.D.   On: 07/10/2022 19:41   DG Chest 2 View  Result Date: 07/10/2022 CLINICAL DATA:  Weakness EXAM: CHEST - 2 VIEW COMPARISON:  03/07/2022 FINDINGS: The heart size and mediastinal contours are within  normal limits. Aortic atherosclerosis. Pulmonary nodules seen on the previous CT are not well demonstrated radiographically. No lobar consolidation. No pleural effusion or pneumothorax. Degenerative changes of both shoulders. IMPRESSION: No active cardiopulmonary disease. Pulmonary nodules seen on the previous CT are not well demonstrated radiographically. Electronically Signed   By: Duanne Guess D.O.   On: 07/10/2022 15:55   CT HEAD WO CONTRAST ( )  Result Date: 07/10/2022 CLINICAL DATA:  Altered mental status, fall EXAM: CT HEAD WITHOUT CONTRAST TECHNIQUE: Contiguous axial images were obtained from the base of the skull through the vertex without intravenous contrast. RADIATION DOSE REDUCTION: This exam was performed according to the departmental dose-optimization program which includes automated exposure control, adjustment of the mA and/or kV according to patient size and/or use of iterative reconstruction technique. COMPARISON:  Previous study done on 07/09/2021 FINDINGS: Brain: No acute intracranial findings are seen. There are no signs of bleeding within the cranium. Ventricles are not dilated. Cortical sulci are prominent. Vascular: Unremarkable. Skull: No fracture is seen in calvarium P Sinuses/Orbits: There is mucosal thickening in ethmoid and maxillary sinuses. Other: None. IMPRESSION: No acute intracranial findings are seen.  Atrophy. Chronic sinusitis. Electronically Signed   By: Ernie Avena M.D.   On: 07/10/2022 15:51    Microbiology: Results for orders placed or  performed during the hospital encounter of 07/10/22  Culture, blood (Routine X 2) w Reflex to ID Panel     Status: None   Collection Time: 07/10/22  2:04 PM   Specimen: BLOOD  Result Value Ref Range Status   Specimen Description BLOOD RIGHT [facility]  Final   Special Requests   Final    BOTTLES DRAWN AEROBIC AND ANAEROBIC Blood Culture adequate volume   Culture   Final    NO GROWTH 5 DAYS Performed at [facility], [address] [place], [place] [address]    Report Status 07/15/2022 FINAL  Final  SARS Coronavirus 2 by RT PCR (hospital order, performed in [facility] hospital lab) *cepheid single result test* Anterior Nasal Swab     Status: None   Collection Time: 07/10/22  3:14 PM   Specimen: Anterior Nasal Swab  Result Value Ref Range Status   SARS Coronavirus 2 by RT PCR NEGATIVE NEGATIVE Final    Comment: (NOTE) SARS-CoV-2 target nucleic acids are NOT DETECTED.  The SARS-CoV-2 RNA is generally detectable in upper and lower respiratory specimens during the acute phase of infection. The lowest concentration of SARS-CoV-2 viral copies this assay can detect is 250 copies / mL. A negative result does not preclude SARS-CoV-2 infection and should not be used as the sole basis for treatment or other patient management decisions.  A negative result may occur with improper specimen collection / handling, submission of specimen other than nasopharyngeal swab, presence of viral mutation(s) within the areas targeted by this assay, and inadequate number of viral copies (<250 copies / mL). A negative result must be combined with clinical observations, patient history, and epidemiological information.  Fact Sheet for Patients:   RoadLapTop.co.za  Fact Sheet for Healthcare Providers: http://kim-miller.com/  This test is not yet approved or  cleared by the [place] FDA and has been authorized for detection and/or diagnosis of SARS-CoV-2  by FDA under an Emergency Use Authorization (EUA).  This EUA will remain in effect (meaning this test can be used) for the duration of the COVID-19 declaration under Section 564(b)(1) of the Act, 21 U.S.C. section 360bbb-3(b)(1), unless the authorization is terminated or revoked sooner.  Performed at [facility], [address] [place], [place] [address]   Culture, blood (Routine X 2) w Reflex to ID Panel     Status: None   Collection Time: 07/10/22  9:01 PM   Specimen: BLOOD LEFT ARM  Result Value Ref Range Status   Specimen Description BLOOD LEFT ARM  Final   Special Requests   Final    BOTTLES DRAWN AEROBIC AND ANAEROBIC Blood Culture adequate volume   Culture   Final    NO GROWTH 5 DAYS Performed at [facility], [address] [place], [place] [address]    Report Status 07/15/2022 FINAL  Final    Labs: CBC: Recent Labs  Lab 08/03/22 0641 08/05/22 0456 08/08/22 0413  WBC 5.9 7.4 7.0  HGB 11.5* 11.0* 12.0*  HCT 34.7* 33.0* 36.2*  MCV 94.0 92.7 93.1  PLT 204 261 276   Basic Metabolic Panel: Recent Labs  Lab 08/03/22 0641 08/05/22 0456 08/08/22 0413  NA 137 136 135  K 4.1 4.0 4.1  CL 104 101 97*  CO2 26 29 28   GLUCOSE 91 88 97  BUN 11 20 18   CREATININE 0.60* 0.70 0.67  CALCIUM 9.0 8.6* 9.0  MG 2.2  --   --    Liver Function Tests: No results for input(s): "AST", "ALT", "ALKPHOS", "BILITOT", "PROT", "ALBUMIN" in the last 168 hours. CBG: No results for input(s): "GLUCAP" in the last 168 hours.  Discharge time spent: less than 30 minutes.  Signed: Pennie Banter, DO [facility] 08/08/2022

## 2022-08-09 ENCOUNTER — Telehealth: Payer: Self-pay | Admitting: Neurosurgery

## 2022-08-09 NOTE — Telephone Encounter (Signed)
Dr Myer Haff said no collar needed. Standard precautions (no lifting more than 5-10 pounds for the first 6 weeks from date of surgery, can increase to 25 pounds once he reaches weeks 6-12 from surgery, can lift as tolerated once 12 weeks from surgery).

## 2022-08-09 NOTE — Telephone Encounter (Signed)
posterior cervical fusion 08/03/22  [place] [facility] PT department cell [phone]  Does patient have any precautions with? Should he be wearing his collar at all times?

## 2022-08-10 NOTE — Telephone Encounter (Signed)
Notified [place] of Dr Lucienne Capers recommendations and advised her to contact us with any further questions.

## 2022-08-15 NOTE — Progress Notes (Deleted)
   REFERRING PHYSICIAN:  Leanna Sato, Md [address] [place],  [place] [address]  DOS: 08/03/22  C2-3 PSF/decompression with C2-4 instrumentation for myelopathy  HISTORY OF PRESENT ILLNESS: Frank Buckley is 2 weeks status post C2-3 PSF/decompression with C2-4 instrumentation . Given celebrex, robaxin, and oxycodone on discharge from the hospital.      PHYSICAL EXAMINATION:  NEUROLOGICAL:  General: In no acute distress.   Awake, alert, oriented to person, place, and time.  Pupils equal round and reactive to light.  Facial tone is symmetric.    Strength: Side Biceps Triceps Deltoid Interossei Grip Wrist Ext. Wrist Flex.  R 4- 4 4- 4- 4 4- 3  L 4 4 4 4 4 3 3    Side Iliopsoas Quads Hamstring PF DF EHL  R 4 4 4 4  4+ 4  L 4 4 4 4  4+ 4    Incision c/d/i  Imaging:  Nothing new to review.   Assessment / Plan: Frank Buckley is doing well s/p above surgery. Treatment options reviewed with patient and following plan made:   - No collar needed per Dr. Myer Haff.  - We discussed activity escalation and I have advised the patient to lift up to 10 pounds until 6 weeks after surgery (until follow up with Dr. Myer Haff).   - Reviewed wound care.  - Continue current medications including *** - Follow up as scheduled in 4 weeks and prn.   Advised to contact the office if any questions or concerns arise.   Drake Leach PA-C Dept of Neurosurgery

## 2022-08-17 ENCOUNTER — Encounter: Payer: Medicare Other | Admitting: Orthopedic Surgery

## 2022-08-17 DIAGNOSIS — G959 Disease of spinal cord, unspecified: Secondary | ICD-10-CM

## 2022-08-17 DIAGNOSIS — Z981 Arthrodesis status: Secondary | ICD-10-CM

## 2022-08-19 ENCOUNTER — Ambulatory Visit (INDEPENDENT_AMBULATORY_CARE_PROVIDER_SITE_OTHER): Payer: Medicare Other | Admitting: Orthopedic Surgery

## 2022-08-19 ENCOUNTER — Encounter: Payer: Self-pay | Admitting: Orthopedic Surgery

## 2022-08-19 VITALS — BP 93/61 | HR 79 | Temp 97.7°F

## 2022-08-19 DIAGNOSIS — M4802 Spinal stenosis, cervical region: Secondary | ICD-10-CM

## 2022-08-19 DIAGNOSIS — Z981 Arthrodesis status: Secondary | ICD-10-CM

## 2022-08-19 DIAGNOSIS — G959 Disease of spinal cord, unspecified: Secondary | ICD-10-CM

## 2022-08-19 DIAGNOSIS — Z09 Encounter for follow-up examination after completed treatment for conditions other than malignant neoplasm: Secondary | ICD-10-CM

## 2022-08-19 NOTE — Progress Notes (Signed)
   REFERRING PHYSICIAN:  Leanna Sato, Md [address] [place],  [place] [address]  DOS: 08/03/22  C2-3 PSF/decompression with C2-4 instrumentation for myelopathy  HISTORY OF PRESENT ILLNESS: Frank Buckley is 2 weeks status post C2-3 PSF/decompression with C2-4 instrumentation . Given celebrex, robaxin, and oxycodone on discharge from the hospital.   He is doing well. He has minimal neck pain. No arm pain. Has some numbness and tingling in his hands. He is working with PT at [facility].    PHYSICAL EXAMINATION:  NEUROLOGICAL:  General: In no acute distress.   Awake, alert, oriented to person, place, and time.  Pupils equal round and reactive to light.  Facial tone is symmetric.    Strength: Side Biceps Triceps Deltoid Interossei Grip Wrist Ext. Wrist Flex.  R 4- 4 4+ 4- 4+ 4- 4+  L 4 4 4 4 5  4- 4-   Side Iliopsoas Quads Hamstring PF DF EHL  R 4 4+ 4+ 4 4+ 4  L 5 5 5  4+ 4+ 4+    Incision c/d/I, staples removed.   Imaging:  Nothing new to review.   Assessment / Plan: WELDEN MANWELL is doing well s/p above surgery. Treatment options reviewed with patient and following plan made:   - No collar needed per Dr. Myer Haff.  - We discussed activity escalation and I have advised the patient to lift up to 10 pounds until 6 weeks after surgery (until follow up with Dr. Myer Haff).   - Reviewed wound care.  - Continue current medications including celebrex and prn robaxin.  - Follow up as scheduled in 4 weeks and prn. He will need to come an hour and a half early and get xrays at [facility] prior to his appointment.   Advised to contact the office if any questions or concerns arise.   Drake Leach PA-C Dept of Neurosurgery

## 2022-09-12 ENCOUNTER — Other Ambulatory Visit: Payer: Self-pay

## 2022-09-12 DIAGNOSIS — G959 Disease of spinal cord, unspecified: Secondary | ICD-10-CM

## 2022-09-13 ENCOUNTER — Encounter: Payer: Medicare Other | Admitting: Neurosurgery

## 2022-10-02 ENCOUNTER — Emergency Department
Admission: EM | Admit: 2022-10-02 | Discharge: 2022-10-03 | Disposition: A | Discharge status: Home / Self Care | Payer: Medicare Other | Attending: Emergency Medicine | Admitting: Emergency Medicine

## 2022-10-02 ENCOUNTER — Emergency Department: Payer: Medicare Other

## 2022-10-02 ENCOUNTER — Other Ambulatory Visit: Payer: Self-pay

## 2022-10-02 DIAGNOSIS — R600 Localized edema: Secondary | ICD-10-CM | POA: Insufficient documentation

## 2022-10-02 DIAGNOSIS — R0602 Shortness of breath: Secondary | ICD-10-CM | POA: Diagnosis not present

## 2022-10-02 DIAGNOSIS — M79606 Pain in leg, unspecified: Secondary | ICD-10-CM | POA: Diagnosis present

## 2022-10-02 LAB — CBC WITH DIFFERENTIAL/PLATELET
Abs Immature Granulocytes: 0 10*3/uL (ref 0.00–0.07)
Basophils Absolute: 0.1 10*3/uL (ref 0.0–0.1)
Basophils Relative: 1 %
Eosinophils Absolute: 0.3 10*3/uL (ref 0.0–0.5)
Eosinophils Relative: 6 %
HCT: 33.3 % — ABNORMAL LOW (ref 39.0–52.0)
Hemoglobin: 10.9 g/dL — ABNORMAL LOW (ref 13.0–17.0)
Immature Granulocytes: 0 %
Lymphocytes Relative: 30 %
Lymphs Abs: 1.3 10*3/uL (ref 0.7–4.0)
MCH: 30.5 pg (ref 26.0–34.0)
MCHC: 32.7 g/dL (ref 30.0–36.0)
MCV: 93.3 fL (ref 80.0–100.0)
Monocytes Absolute: 0.3 10*3/uL (ref 0.1–1.0)
Monocytes Relative: 8 %
Neutro Abs: 2.3 10*3/uL (ref 1.7–7.7)
Neutrophils Relative %: 55 %
Platelets: 288 10*3/uL (ref 150–400)
RBC: 3.57 MIL/uL — ABNORMAL LOW (ref 4.22–5.81)
RDW: 12.5 % (ref 11.5–15.5)
WBC: 4.2 10*3/uL (ref 4.0–10.5)
nRBC: 0 % (ref 0.0–0.2)

## 2022-10-02 LAB — COMPREHENSIVE METABOLIC PANEL
ALT: 12 U/L (ref 0–44)
AST: 19 U/L (ref 15–41)
Albumin: 3.7 g/dL (ref 3.5–5.0)
Alkaline Phosphatase: 78 U/L (ref 38–126)
Anion gap: 9 (ref 5–15)
BUN: 9 mg/dL (ref 8–23)
CO2: 24 mmol/L (ref 22–32)
Calcium: 8.8 mg/dL — ABNORMAL LOW (ref 8.9–10.3)
Chloride: 102 mmol/L (ref 98–111)
Creatinine, Ser: 0.61 mg/dL (ref 0.61–1.24)
GFR, Estimated: >60 mL/min (ref >60)
Glucose, Bld: 104 mg/dL — ABNORMAL HIGH (ref 70–99)
Potassium: 3.7 mmol/L (ref 3.5–5.1)
Sodium: 135 mmol/L (ref 135–145)
Total Bilirubin: 0.7 mg/dL (ref 0.3–1.2)
Total Protein: 6.9 g/dL (ref 6.5–8.1)

## 2022-10-02 LAB — BRAIN NATRIURETIC PEPTIDE: B Natriuretic Peptide: 7.1 pg/mL (ref 0.0–100.0)

## 2022-10-02 LAB — TROPONIN I (HIGH SENSITIVITY): Troponin I (High Sensitivity): 3 ng/L (ref <18)

## 2022-10-02 NOTE — ED Provider Notes (Signed)
[facility] Provider Note    Event Date/Time   First MD Initiated Contact with Patient 10/02/22 2255     (approximate)   History   Leg Pain and Shortness of Breath   HPI Frank Buckley is a 77 y.o. male who presents for evaluation of some shortness of breath and burning sensation in both of his ankles.  He states that he saw a snake in his house and tried to catch it for a while and was nervous about it.  He then got tired so he took a nap and when he woke up both of his ankles were burning and he felt like he was little bit swollen so he was concerned that maybe he was bitten by a snake without realizing it.  He said he also felt short of breath while he was trying to catch the snake.  He currently feels okay and has been resting.  He has had no chest pain.  He said his lower back hurts a little bit.  No other injuries or concerns at this time.  No recent fever, no nausea, vomiting, nor abdominal pain.     Physical Exam   Triage Vital Signs: ED Triage Vitals  Enc Vitals Group     BP 10/02/22 2228 103/70     Pulse Rate 10/02/22 2228 93     Resp 10/02/22 2228 18     Temp 10/02/22 2228 98.7 F (37.1 C)     Temp src --      SpO2 10/02/22 2228 100 %     Weight 10/02/22 2226 70.3 kg (155 lb)     Height 10/02/22 2226 1.778 m (5\' 10" )     Head Circumference --      Peak Flow --      Pain Score 10/02/22 2226 7     Pain Loc --      Pain Edu? --      Excl. in GC? --     Most recent vital signs: Vitals:   10/02/22 2330 10/03/22 0000  BP: 108/64 113/71  Pulse: 72 68  Resp: 18   Temp:    SpO2: 100% 94%    General: Awake, no distress.  CV:  Good peripheral perfusion.  Normal heart sounds. Resp:  Normal effort. Speaking easily and comfortably, no accessory muscle usage nor intercostal retractions.  Lungs are clear to auscultation with no wheezing or rhonchi. Abd:  No distention.  No tenderness to palpation. Other:  Trace bilateral pitting edema with  some chronic skin thickening, slightly greater on the right than the left.  No visible puncture wounds or localized erythema suggestive of snakebite.  No difficulty with ambulation.   ED Results / Procedures / Treatments   Labs (all labs ordered are listed, but only abnormal results are displayed) Labs Reviewed  CBC WITH DIFFERENTIAL/PLATELET - Abnormal; Notable for the following components:      Result Value   RBC 3.57 (*)    Hemoglobin 10.9 (*)    HCT 33.3 (*)    All other components within normal limits  COMPREHENSIVE METABOLIC PANEL - Abnormal; Notable for the following components:   Glucose, Bld 104 (*)    Calcium 8.8 (*)    All other components within normal limits  BRAIN NATRIURETIC PEPTIDE  TROPONIN I (HIGH SENSITIVITY)     EKG  ED ECG REPORT I, Loleta Rose, the attending physician, personally viewed and interpreted this ECG.  Date: 10/02/2022 EKG Time: 22: 31 Rate:  84 Rhythm: normal sinus rhythm QRS Axis: normal Intervals: normal ST/T Wave abnormalities: Non-specific ST segment / T-wave changes, but no clear evidence of acute ischemia. Narrative Interpretation: no definitive evidence of acute ischemia; does not meet STEMI criteria.    RADIOLOGY I viewed and interpreted the patient's two-view chest x-ray.  No evidence of pneumonia, pulmonary edema, nor other acute abnormality.   PROCEDURES:  Critical Care performed: No  Procedures    IMPRESSION / MDM / ASSESSMENT AND PLAN / ED COURSE  I reviewed the triage vital signs and the nursing notes.                              Differential diagnosis includes, but is not limited to, CHF, COPD, asthma, pneumonia, ACS, PE, snakebite.  Patient's presentation is most consistent with acute presentation with potential threat to life or bodily function.  Labs/studies ordered: 2 view chest x-ray, EKG, CBC with differential, high-sensitivity troponin, BNP, CMP  Interventions/Medications given:  Medications   acetaminophen (TYLENOL) tablet 650 mg (650 mg Oral Given 10/03/22 0011)    (Note:  hospital course my include additional interventions and/or labs/studies not listed above.)   Vital signs are stable within normal limits including no hypoxemia.  No physical evidence of snakebite.  Patient likely had a slight panic attack after seeing the snake and after exerting himself to try to find it.  Evaluation/workup is completely reassuring with no clinically significant abnormalities on lab work, imaging, EKG, etc.  I provided reassurance and the patient is comfortable with the plan for discharge and outpatient follow-up.         FINAL CLINICAL IMPRESSION(S) / ED DIAGNOSES   Final diagnoses:  Peripheral edema     Rx / DC Orders   ED Discharge Orders     None        Note:  This document was prepared using Dragon voice recognition software and may include unintentional dictation errors.   Loleta Rose, MD 10/03/22 [phone]

## 2022-10-02 NOTE — ED Triage Notes (Signed)
Pt states he is having a burning sensation in his bilateral ankles, pt states he saw a snake in his house before he went to be and was unable to catch it so when he woke up feeling this way he was nervous the snake bit him. No bite marks noted to either ankle, pt does have some swelling bilaterally. Pt states he is also having some sob.

## 2022-10-03 MED ORDER — ACETAMINOPHEN 325 MG PO TABS
650.0000 mg | ORAL_TABLET | Freq: Once | ORAL | Status: AC
  Administered 2022-10-03: 650 mg via ORAL
  Filled 2022-10-03: qty 2

## 2022-10-03 NOTE — Discharge Instructions (Signed)
Your workup in the Emergency Department today was reassuring.  We did not find any specific abnormalities.  We recommend you drink plenty of fluids, take your regular medications and/or any new ones prescribed today, and follow up with the doctor(s) listed in these documents as recommended.  Return to the Emergency Department if you develop new or worsening symptoms that concern you.  

## 2022-10-03 NOTE — ED Notes (Signed)
ED Provider at bedside. 

## 2022-10-16 ENCOUNTER — Emergency Department: Payer: Medicare Other

## 2022-10-16 DIAGNOSIS — R202 Paresthesia of skin: Secondary | ICD-10-CM | POA: Diagnosis present

## 2022-10-16 DIAGNOSIS — G629 Polyneuropathy, unspecified: Secondary | ICD-10-CM | POA: Insufficient documentation

## 2022-10-16 LAB — CBC
HCT: 32.1 % — ABNORMAL LOW (ref 39.0–52.0)
Hemoglobin: 10.5 g/dL — ABNORMAL LOW (ref 13.0–17.0)
MCH: 30.7 pg (ref 26.0–34.0)
MCHC: 32.7 g/dL (ref 30.0–36.0)
MCV: 93.9 fL (ref 80.0–100.0)
Platelets: 217 10*3/uL (ref 150–400)
RBC: 3.42 MIL/uL — ABNORMAL LOW (ref 4.22–5.81)
RDW: 12.5 % (ref 11.5–15.5)
WBC: 4.7 10*3/uL (ref 4.0–10.5)
nRBC: 0 % (ref 0.0–0.2)

## 2022-10-16 LAB — COMPREHENSIVE METABOLIC PANEL
ALT: 12 U/L (ref 0–44)
AST: 21 U/L (ref 15–41)
Albumin: 3.8 g/dL (ref 3.5–5.0)
Alkaline Phosphatase: 67 U/L (ref 38–126)
Anion gap: 7 (ref 5–15)
BUN: 15 mg/dL (ref 8–23)
CO2: 28 mmol/L (ref 22–32)
Calcium: 8.8 mg/dL — ABNORMAL LOW (ref 8.9–10.3)
Chloride: 104 mmol/L (ref 98–111)
Creatinine, Ser: 0.79 mg/dL (ref 0.61–1.24)
GFR, Estimated: >60 mL/min (ref >60)
Glucose, Bld: 112 mg/dL — ABNORMAL HIGH (ref 70–99)
Potassium: 3.4 mmol/L — ABNORMAL LOW (ref 3.5–5.1)
Sodium: 139 mmol/L (ref 135–145)
Total Bilirubin: 0.9 mg/dL (ref 0.3–1.2)
Total Protein: 7.1 g/dL (ref 6.5–8.1)

## 2022-10-16 NOTE — ED Triage Notes (Signed)
Pt arrived POV for continued bilateral leg burning sensation and SOB that has not improved since 6/30, unsure if snake had bitten him. VSS, NAD noted, A&O x4.

## 2022-10-17 ENCOUNTER — Emergency Department
Admission: EM | Admit: 2022-10-17 | Discharge: 2022-10-17 | Disposition: A | Discharge status: Home / Self Care | Payer: Medicare Other | Attending: Emergency Medicine | Admitting: Emergency Medicine

## 2022-10-17 DIAGNOSIS — G629 Polyneuropathy, unspecified: Secondary | ICD-10-CM

## 2022-10-17 NOTE — ED Provider Notes (Signed)
[facility] Provider Note    Event Date/Time   First MD Initiated Contact with Patient 10/17/22 0300     (approximate)   History   Leg Pain   HPI Frank Buckley is a 77 y.o. male who presents for evaluation of some tingling pain in both of his legs.  This has been going on for weeks.  I saw the patient a couple weeks ago for the same, although at that visit he reported that he had seen a snake in his house and it made him scared, short of breath, and he wanted to make sure he was okay.  The patient said he still occasionally feels short of breath but has not seen any more snakes.  He has no chest pain, nausea, vomiting, fever, nor abdominal pain.  He has not been to see his regular primary care provider to discuss the tingling in his lower legs.  Of note, he was sleeping very soundly when I checked on him but I was able to wake him up by talking loudly to him and shaking his shoulders.  He is currently reporting no pain.     Physical Exam   Triage Vital Signs: ED Triage Vitals [10/16/22 2301]  Encounter Vitals Group     BP 107/80     Systolic BP Percentile      Diastolic BP Percentile      Pulse Rate 90     Resp 16     Temp 98.5 F (36.9 C)     Temp Source Oral     SpO2 98 %     Weight 70.3 kg (155 lb)     Height 1.778 m (5\' 10" )     Head Circumference      Peak Flow      Pain Score 5     Pain Loc      Pain Education      Exclude from Growth Chart     Most recent vital signs: Vitals:   10/16/22 2301 10/17/22 0513  BP: 107/80 120/76  Pulse: 90 72  Resp: 16 16  Temp: 98.5 F (36.9 C) (!) 97.4 F (36.3 C)  SpO2: 98% 98%    General: Sleeping but awakens with some effort.  No distress once awake. CV:  Good peripheral perfusion.  Regular rate and rhythm. Resp:  Normal effort. Speaking easily and comfortably, no accessory muscle usage nor intercostal retractions.   Abd:  No distention.  Other:  Chronic trace pitting edema in bilateral  lower extremities, no evidence of infection, both extremities are warm and well-perfused.   ED Results / Procedures / Treatments   Labs (all labs ordered are listed, but only abnormal results are displayed) Labs Reviewed  COMPREHENSIVE METABOLIC PANEL - Abnormal; Notable for the following components:      Result Value   Potassium 3.4 (*)    Glucose, Bld 112 (*)    Calcium 8.8 (*)    All other components within normal limits  CBC - Abnormal; Notable for the following components:   RBC 3.42 (*)    Hemoglobin 10.5 (*)    HCT 32.1 (*)    All other components within normal limits     EKG  ED ECG REPORT I, Loleta Rose, the attending physician, personally viewed and interpreted this ECG.  Date: 10/16/2022 EKG Time: 23: 02 Rate: 84 Rhythm: normal sinus rhythm QRS Axis: normal Intervals: normal ST/T Wave abnormalities: normal Narrative Interpretation: no evidence of acute ischemia  RADIOLOGY I viewed and interpreted the patient's 1 view chest x-ray.  I see no evidence of pneumonia or pneumothorax.  I also read the radiologist's report, which confirmed no acute findings.   PROCEDURES:  Critical Care performed: No  Procedures    IMPRESSION / MDM / ASSESSMENT AND PLAN / ED COURSE  I reviewed the triage vital signs and the nursing notes.                              Differential diagnosis includes, but is not limited to, anxiety/panic attacks, peripheral neuropathy, undiagnosed diabetes, poor perfusion, peripheral edema.  Patient's presentation is most consistent with acute presentation with potential threat to life or bodily function.  Labs/studies ordered: EKG, chest x-ray, CBC, CMP  Interventions/Medications given:  Medications - No data to display  (Note:  hospital course my include additional interventions and/or labs/studies not listed above.)   Overall reassuring exam, similar to my prior experience with the patient a couple of weeks ago.  No evidence  of infectious process.  Labs all essentially normal with no clinically significant abnormalities.  Vital signs also stable and within normal limits.  No indication for emergent imaging other than that which was already ordered.  Patient has no evidence of cardiopulmonary process and there is no indication for admission.  I updated him about the need to follow-up with his primary care provider to discuss his peripheral tingling and he understands and agrees.  Stable for discharge.         FINAL CLINICAL IMPRESSION(S) / ED DIAGNOSES   Final diagnoses:  Peripheral polyneuropathy     Rx / DC Orders   ED Discharge Orders     None        Note:  This document was prepared using Dragon voice recognition software and may include unintentional dictation errors.   Loleta Rose, MD 10/17/22 [phone]

## 2022-10-21 NOTE — Progress Notes (Deleted)
   REFERRING PHYSICIAN:  Leanna Sato, Md [address] 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   He missed his 6 week follow up.      Given celebrex, robaxin, and oxycodone on discharge from the hospital.   He is doing well. He has minimal neck pain. No arm pain. Has some numbness and tingling in his hands. He is working with PT at [facility].    PHYSICAL EXAMINATION:  NEUROLOGICAL:  General: In no acute distress.   Awake, alert, oriented to person, place, and time.  Pupils equal round and reactive to light.  Facial tone is symmetric.    Strength: Side Biceps Triceps Deltoid Interossei Grip Wrist Ext. Wrist Flex.  R 4- 4 4+ 4- 4+ 4- 4+  L 4 4 4 4 5  4- 4-   Side Iliopsoas Quads Hamstring PF DF EHL  R 4 4+ 4+ 4 4+ 4  L 5 5 5  4+ 4+ 4+    Incision well healed  Imaging:  Cervical xrays dated ***:  No complications noted.***  Radiology report for above xrays not yet available.   Assessment / Plan: Frank Buckley is doing well s/p above surgery. Treatment options reviewed with patient and following plan made:   - No collar needed per Dr. Myer Haff.  - We discussed activity escalation and I have advised the patient to lift up to 10 pounds until 6 weeks after surgery (until follow up with Dr. Myer Haff).   - Reviewed wound care.  - Continue current medications including celebrex and prn robaxin.  - Follow up as scheduled in 4 weeks and prn. He will need to come an hour and a half early and get xrays at [facility] prior to his appointment.   Advised to contact the office if any questions or concerns arise.   Drake Leach PA-C Dept of Neurosurgery

## 2022-10-25 ENCOUNTER — Encounter: Payer: Medicare Other | Admitting: Orthopedic Surgery

## 2022-10-28 NOTE — Progress Notes (Deleted)
   REFERRING PHYSICIAN:  Leanna Sato, Md [address] 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   He missed his 6 week follow up.      Given celebrex, robaxin, and oxycodone on discharge from the hospital.   He is doing well. He has minimal neck pain. No arm pain. Has some numbness and tingling in his hands. He is working with PT at [facility].    PHYSICAL EXAMINATION:  NEUROLOGICAL:  General: In no acute distress.   Awake, alert, oriented to person, place, and time.  Pupils equal round and reactive to light.  Facial tone is symmetric.    Strength: Side Biceps Triceps Deltoid Interossei Grip Wrist Ext. Wrist Flex.  R 4- 4 4+ 4- 4+ 4- 4+  L 4 4 4 4 5  4- 4-   Side Iliopsoas Quads Hamstring PF DF EHL  R 4 4+ 4+ 4 4+ 4  L 5 5 5  4+ 4+ 4+    Incision well healed  Imaging:  Cervical xrays dated ***:  No complications noted.***  Radiology report for above xrays not yet available.   Assessment / Plan: JASTIN FORE is doing well s/p above surgery. Treatment options reviewed with patient and following plan made:   - No collar needed per Dr. Myer Haff.  - We discussed activity escalation and I have advised the patient to lift up to 10 pounds until 6 weeks after surgery (until follow up with Dr. Myer Haff).   - Reviewed wound care.  - Continue current medications including celebrex and prn robaxin.  - Follow up as scheduled in 4 weeks and prn. He will need to come an hour and a half early and get xrays at [facility] prior to his appointment.   Advised to contact the office if any questions or concerns arise.   Drake Leach PA-C Dept of Neurosurgery

## 2022-11-03 ENCOUNTER — Ambulatory Visit
Admission: RE | Admit: 2022-11-03 | Discharge: 2022-11-03 | Disposition: A | Discharge status: Home / Self Care | Payer: Medicare Other | Source: Ambulatory Visit | Attending: Neurosurgery | Admitting: Neurosurgery

## 2022-11-03 ENCOUNTER — Ambulatory Visit
Admission: RE | Admit: 2022-11-03 | Discharge: 2022-11-03 | Disposition: A | Discharge status: Home / Self Care | Payer: Medicare Other | Attending: Neurosurgery | Admitting: Neurosurgery

## 2022-11-03 ENCOUNTER — Encounter: Payer: Medicare Other | Admitting: Orthopedic Surgery

## 2022-11-03 DIAGNOSIS — G959 Disease of spinal cord, unspecified: Secondary | ICD-10-CM | POA: Insufficient documentation

## 2023-03-29 ENCOUNTER — Emergency Department: Payer: Medicare Other

## 2023-03-29 ENCOUNTER — Emergency Department
Admission: EM | Admit: 2023-03-29 | Discharge: 2023-03-29 | Disposition: A | Discharge status: Home / Self Care | Payer: Medicare Other | Attending: Emergency Medicine | Admitting: Emergency Medicine

## 2023-03-29 ENCOUNTER — Other Ambulatory Visit: Payer: Self-pay

## 2023-03-29 DIAGNOSIS — R600 Localized edema: Secondary | ICD-10-CM

## 2023-03-29 DIAGNOSIS — M7989 Other specified soft tissue disorders: Secondary | ICD-10-CM | POA: Diagnosis present

## 2023-03-29 LAB — CBC WITH DIFFERENTIAL/PLATELET
Abs Immature Granulocytes: 0.01 10*3/uL (ref 0.00–0.07)
Basophils Absolute: 0 10*3/uL (ref 0.0–0.1)
Basophils Relative: 1 %
Eosinophils Absolute: 0.1 10*3/uL (ref 0.0–0.5)
Eosinophils Relative: 2 %
HCT: 33.8 % — ABNORMAL LOW (ref 39.0–52.0)
Hemoglobin: 11.2 g/dL — ABNORMAL LOW (ref 13.0–17.0)
Immature Granulocytes: 0 %
Lymphocytes Relative: 17 %
Lymphs Abs: 1.1 10*3/uL (ref 0.7–4.0)
MCH: 32.3 pg (ref 26.0–34.0)
MCHC: 33.1 g/dL (ref 30.0–36.0)
MCV: 97.4 fL (ref 80.0–100.0)
Monocytes Absolute: 0.4 10*3/uL (ref 0.1–1.0)
Monocytes Relative: 6 %
Neutro Abs: 5 10*3/uL (ref 1.7–7.7)
Neutrophils Relative %: 74 %
Platelets: 245 10*3/uL (ref 150–400)
RBC: 3.47 MIL/uL — ABNORMAL LOW (ref 4.22–5.81)
RDW: 13 % (ref 11.5–15.5)
WBC: 6.7 10*3/uL (ref 4.0–10.5)
nRBC: 0 % (ref 0.0–0.2)

## 2023-03-29 LAB — BASIC METABOLIC PANEL
Anion gap: 11 (ref 5–15)
BUN: 16 mg/dL (ref 8–23)
CO2: 27 mmol/L (ref 22–32)
Calcium: 9.1 mg/dL (ref 8.9–10.3)
Chloride: 100 mmol/L (ref 98–111)
Creatinine, Ser: 0.7 mg/dL (ref 0.61–1.24)
GFR, Estimated: >60 mL/min (ref >60)
Glucose, Bld: 216 mg/dL — ABNORMAL HIGH (ref 70–99)
Potassium: 3.8 mmol/L (ref 3.5–5.1)
Sodium: 138 mmol/L (ref 135–145)

## 2023-03-29 LAB — BRAIN NATRIURETIC PEPTIDE: B Natriuretic Peptide: 26.9 pg/mL (ref 0.0–100.0)

## 2023-03-29 NOTE — ED Triage Notes (Signed)
Pt to ED for RLE swelling since 1 week, +2 pitting edema halfway to knee. No redness or tenderness noted. Very slight swelling to LLE also. Pt recently took doxycycline after insect bite to RLE (prescribed here).   Pt unsure if takes diuretics. Denies SOB.

## 2023-03-29 NOTE — ED Provider Notes (Signed)
[facility] Provider Note    Event Date/Time   First MD Initiated Contact with Patient 03/29/23 1541     (approximate)   History   Leg Swelling   HPI  Frank Buckley is a 77 year old male with history of MGUS, anemia presenting to the emergency department for evaluation of lower extremity swelling.  Patient reports ongoing issues with left lower extremity swelling over the past month.  Reports he was placed on antibiotics by his doctor for a bug bite, unsure on which side.  Over the last week he has noticed some worsening swelling over his right lower extremity.  Denies history of trauma.  No fevers.    Physical Exam   Triage Vital Signs: ED Triage Vitals [03/29/23 1530]  Encounter Vitals Group     BP 129/73     Systolic BP Percentile      Diastolic BP Percentile      Pulse Rate 93     Resp 16     Temp (!) 97.3 F (36.3 C)     Temp Source Oral     SpO2 98 %     Weight 150 lb (68 kg)     Height 5\' 7"  (1.702 m)     Head Circumference      Peak Flow      Pain Score 0     Pain Loc      Pain Education      Exclude from Growth Chart     Most recent vital signs: Vitals:   03/29/23 1530  BP: 129/73  Pulse: 93  Resp: 16  Temp: (!) 97.3 F (36.3 C)  SpO2: 98%     General: Awake, interactive  CV:  Regular rate, good peripheral perfusion.  Resp:  Unlabored respirations, lungs clear to auscultation Abd:  Nondistended.  Neuro:  Symmetric facial movement, fluid speech MSK:  Bilateral pitting edema worse on the right side with intact sensation, 2+ DP pulses bilaterally, not significantly warm or tender to palpation   ED Results / Procedures / Treatments   Labs (all labs ordered are listed, but only abnormal results are displayed) Labs Reviewed  CBC WITH DIFFERENTIAL/PLATELET - Abnormal; Notable for the following components:      Result Value   RBC 3.47 (*)    Hemoglobin 11.2 (*)    HCT 33.8 (*)    All other components within normal  limits  BASIC METABOLIC PANEL - Abnormal; Notable for the following components:   Glucose, Bld 216 (*)    All other components within normal limits  BRAIN NATRIURETIC PEPTIDE     EKG EKG independently reviewed interpreted by myself (ER attending) demonstrates:    RADIOLOGY Imaging independently reviewed and interpreted by myself demonstrates:    PROCEDURES:  Critical Care performed: No  Procedures   MEDICATIONS ORDERED IN ED: Medications - No data to display   IMPRESSION / MDM / ASSESSMENT AND PLAN / ED COURSE  I reviewed the triage vital signs and the nursing notes.  Differential diagnosis includes, but is not limited to, DVT, asymmetric dependent edema, renal dysfunction, CHF  Patient's presentation is most consistent with acute presentation with potential threat to life or bodily function.  77 year old male presenting with lower extremity swelling.  Vital stable on presentation.  Labs with stable anemia.  Normal BNP.  Will obtain ultrasound of bilateral lower extremities.  If ultrasound is reassuring, suspect patient will be stable for discharge with outpatient follow-up.  Ultrasound is reassuring.  Patient reevaluated and updated on results of workup.  Clinical exam is not suggestive of cellulitis.  He is comfortable with discharge and outpatient follow-up.  Strict return precautions provided.      FINAL CLINICAL IMPRESSION(S) / ED DIAGNOSES   Final diagnoses:  Bilateral lower extremity edema     Rx / DC Orders   ED Discharge Orders     None        Note:  This document was prepared using Dragon voice recognition software and may include unintentional dictation errors.   Trinna Post, MD 03/29/23 [phone]

## 2023-03-29 NOTE — Discharge Instructions (Signed)
You were seen in the ER today for your lower extremity swelling.  Your testing was reassuring.  Follow your primary care doctor for further evaluation.  Return to the ER for new or worsening symptoms.

## 2023-03-29 NOTE — ED Notes (Signed)
Presenter, broadcasting is Higher education careers adviser only pt noted to be aox4 uses urinal prior to dc pt denies further needs appears in nad deemed appropriate for dc to home per provider and previous primary RN pt uses walker to ambulate to ed exit pt assisted via wc per pt request pt puts personal clothes on prior to leaving dc paperwork provided pt verbalizes understanding and agrees with dc to home POC non labored resps noted pt speaking in full clear sentences pt skin wdi

## 2023-04-13 ENCOUNTER — Encounter: Payer: Self-pay | Admitting: Oncology

## 2023-04-14 ENCOUNTER — Other Ambulatory Visit: Payer: Self-pay | Admitting: *Deleted

## 2023-04-14 DIAGNOSIS — D472 Monoclonal gammopathy: Secondary | ICD-10-CM

## 2023-04-17 ENCOUNTER — Inpatient Hospital Stay: Payer: Medicare HMO | Attending: Oncology

## 2023-04-17 DIAGNOSIS — D472 Monoclonal gammopathy: Secondary | ICD-10-CM | POA: Insufficient documentation

## 2023-04-17 DIAGNOSIS — G629 Polyneuropathy, unspecified: Secondary | ICD-10-CM | POA: Diagnosis not present

## 2023-04-17 DIAGNOSIS — R918 Other nonspecific abnormal finding of lung field: Secondary | ICD-10-CM | POA: Diagnosis not present

## 2023-04-17 DIAGNOSIS — Z87891 Personal history of nicotine dependence: Secondary | ICD-10-CM | POA: Diagnosis not present

## 2023-04-17 DIAGNOSIS — R634 Abnormal weight loss: Secondary | ICD-10-CM | POA: Diagnosis not present

## 2023-04-17 LAB — CBC WITH DIFFERENTIAL/PLATELET
Abs Immature Granulocytes: 0.01 10*3/uL (ref 0.00–0.07)
Basophils Absolute: 0 10*3/uL (ref 0.0–0.1)
Basophils Relative: 1 %
Eosinophils Absolute: 0.2 10*3/uL (ref 0.0–0.5)
Eosinophils Relative: 4 %
HCT: 34.5 % — ABNORMAL LOW (ref 39.0–52.0)
Hemoglobin: 11.5 g/dL — ABNORMAL LOW (ref 13.0–17.0)
Immature Granulocytes: 0 %
Lymphocytes Relative: 24 %
Lymphs Abs: 1.1 10*3/uL (ref 0.7–4.0)
MCH: 32 pg (ref 26.0–34.0)
MCHC: 33.3 g/dL (ref 30.0–36.0)
MCV: 96.1 fL (ref 80.0–100.0)
Monocytes Absolute: 0.4 10*3/uL (ref 0.1–1.0)
Monocytes Relative: 8 %
Neutro Abs: 3.1 10*3/uL (ref 1.7–7.7)
Neutrophils Relative %: 63 %
Platelets: 241 10*3/uL (ref 150–400)
RBC: 3.59 MIL/uL — ABNORMAL LOW (ref 4.22–5.81)
RDW: 12.7 % (ref 11.5–15.5)
WBC: 4.8 10*3/uL (ref 4.0–10.5)
nRBC: 0 % (ref 0.0–0.2)

## 2023-04-17 LAB — BASIC METABOLIC PANEL - CANCER CENTER ONLY
Anion gap: 8 (ref 5–15)
BUN: 9 mg/dL (ref 8–23)
CO2: 29 mmol/L (ref 22–32)
Calcium: 9.5 mg/dL (ref 8.9–10.3)
Chloride: 98 mmol/L (ref 98–111)
Creatinine: 0.59 mg/dL — ABNORMAL LOW (ref 0.61–1.24)
GFR, Estimated: >60 mL/min (ref >60)
Glucose, Bld: 82 mg/dL (ref 70–99)
Potassium: 4.1 mmol/L (ref 3.5–5.1)
Sodium: 135 mmol/L (ref 135–145)

## 2023-04-18 LAB — KAPPA/LAMBDA LIGHT CHAINS
Kappa free light chain: 18.7 mg/L (ref 3.3–19.4)
Kappa, lambda light chain ratio: 1.57 (ref 0.26–1.65)
Lambda free light chains: 11.9 mg/L (ref 5.7–26.3)

## 2023-04-20 LAB — PROTEIN ELECTROPHORESIS, SERUM
A/G Ratio: 1.1 (ref 0.7–1.7)
Albumin ELP: 3.7 g/dL (ref 2.9–4.4)
Alpha-1-Globulin: 0.2 g/dL (ref 0.0–0.4)
Alpha-2-Globulin: 0.6 g/dL (ref 0.4–1.0)
Beta Globulin: 0.9 g/dL (ref 0.7–1.3)
Gamma Globulin: 1.7 g/dL (ref 0.4–1.8)
Globulin, Total: 3.4 g/dL (ref 2.2–3.9)
M-Spike, %: 0.5 g/dL — ABNORMAL HIGH
Total Protein ELP: 7.1 g/dL (ref 6.0–8.5)

## 2023-04-23 LAB — GLUCOSE, POCT (MANUAL RESULT ENTRY): POC Glucose: 97 mg/dL (ref 70–99)

## 2023-04-24 ENCOUNTER — Encounter: Payer: Self-pay | Admitting: Oncology

## 2023-04-24 ENCOUNTER — Inpatient Hospital Stay (HOSPITAL_BASED_OUTPATIENT_CLINIC_OR_DEPARTMENT_OTHER): Payer: Medicare HMO | Admitting: Oncology

## 2023-04-24 VITALS — BP 107/79 | HR 75 | Temp 97.5°F | Resp 16 | Ht 67.0 in | Wt 133.5 lb

## 2023-04-24 DIAGNOSIS — D472 Monoclonal gammopathy: Secondary | ICD-10-CM | POA: Diagnosis not present

## 2023-04-24 DIAGNOSIS — R918 Other nonspecific abnormal finding of lung field: Secondary | ICD-10-CM | POA: Diagnosis not present

## 2023-04-24 NOTE — Progress Notes (Signed)
[facility]  [phone] [phone]  ID: Frank Buckley OB: [birth year]  MR#: [id number]  ION#:[id number]  Patient Care Team: Leanna Sato, MD as PCP - General (Family Medicine) Jeralyn Ruths, MD as Consulting Physician (Oncology)  CHIEF COMPLAINT: MGUS, pulmonary nodules.  INTERVAL HISTORY: Patient last seen in clinic nearly 2 years ago.  He returns for further evaluation and discussion of his laboratory results.  He has had gradual weight loss over the past several years that by report is unintentional.  He continues to have a mild peripheral neuropathy, but otherwise feels well. He has no other neurologic complaints.  He denies any recent fevers or illnesses.  He denies any chest pain, shortness of breath, cough, or hemoptysis.  He denies any nausea, vomiting, constipation, or diarrhea.  He has no urinary complaints.  Patient offers no further specific complaints today.  REVIEW OF SYSTEMS:   Review of Systems  Constitutional:  Positive for weight loss. Negative for fever and malaise/fatigue.  Respiratory: Negative.  Negative for cough, hemoptysis and shortness of breath.   Cardiovascular: Negative.  Negative for chest pain and leg swelling.  Gastrointestinal: Negative.  Negative for abdominal pain.  Genitourinary: Negative.  Negative for hematuria.  Musculoskeletal: Negative.  Negative for back pain and joint pain.  Skin: Negative.  Negative for rash.  Neurological:  Positive for sensory change. Negative for focal weakness, weakness and headaches.  Psychiatric/Behavioral: Negative.  The patient is not nervous/anxious.     As per HPI. Otherwise, a complete review of systems is negative.  PAST MEDICAL HISTORY: Past Medical History:  Diagnosis Date   Acute metabolic encephalopathy 07/10/2022   Borderline diabetic    Cancer (HCC)    Elevated liver enzymes 07/10/2022   GERD (gastroesophageal reflux disease)     PAST SURGICAL  HISTORY: Past Surgical History:  Procedure Laterality Date   APPLICATION OF INTRAOPERATIVE CT SCAN N/A 08/03/2022   Procedure: APPLICATION OF INTRAOPERATIVE CT SCAN;  Surgeon: Venetia Night, MD;  Location: [facility] ORS;  Service: Neurosurgery;  Laterality: N/A;   CERVICAL FUSION     POSTERIOR CERVICAL FUSION/FORAMINOTOMY N/A 08/03/2022   Procedure: POSTERIOR CERVICAL FUSION/FORAMINOTOMY LEVEL 3;  Surgeon: Venetia Night, MD;  Location: [facility] ORS;  Service: Neurosurgery;  Laterality: N/A;  C2-3 PSFD with C2-4 instrumentation    FAMILY HISTORY: History reviewed. No pertinent family history.  ADVANCED DIRECTIVES (Y/N):  N  HEALTH MAINTENANCE: Social History   Tobacco Use   Smoking status: Former   Smokeless tobacco: Never  Vaping Use   Vaping status: Never Used  Substance Use Topics   Alcohol use: No   Drug use: Never     Colonoscopy:  PAP:  Bone density:  Lipid panel:  Allergies  Allergen Reactions   Penicillins Rash    Current Outpatient Medications  Medication Sig Dispense Refill   acetaminophen (TYLENOL) 325 MG tablet Take 2 tablets (650 mg total) by mouth every 6 (six) hours as needed for mild pain (or Fever >/= 101). 20 tablet 0   alum & mag hydroxide-simeth (MAALOX/MYLANTA) 200-200-20 MG/5ML suspension Take 30 mLs by mouth every 4 (four) hours as needed for indigestion. 355 mL 0   aspirin 81 MG chewable tablet Chew 1 tablet (81 mg total) by mouth daily.     celecoxib (CELEBREX) 100 MG capsule Take 1 capsule (100 mg total) by mouth 2 (two) times daily.     docusate sodium (COLACE) 100 MG capsule Take 100 mg by mouth daily.  doxazosin (CARDURA) 8 MG tablet Take 1 tablet (8 mg total) by mouth daily. 90 tablet 3   ergocalciferol (VITAMIN D2) 1.25 MG (50000 UT) capsule Take 1 capsule (50,000 Units total) by mouth once a week. 12 capsule 1   melatonin 3 MG TABS tablet Take 6 mg by mouth at bedtime.     methocarbamol (ROBAXIN) 500 MG tablet Take 1 tablet (500 mg total)  by mouth every 6 (six) hours as needed for muscle spasms.     mirabegron ER (MYRBETRIQ) 25 MG TB24 tablet Take 1 tablet (25 mg total) by mouth daily. 28 tablet 0   omeprazole (PRILOSEC) 20 MG capsule Take 20 mg by mouth daily.     polyethylene glycol (MIRALAX / GLYCOLAX) 17 g packet Take 17 g by mouth 2 (two) times daily. 14 each 0   finasteride (PROSCAR) 5 MG tablet Take 1 tablet (5 mg total) by mouth daily. (Patient not taking: Reported on 10/02/2022) 90 tablet 3   Miconazole Nitrate (LOTRIMIN AF POWDER) 2 % AERP Apply 1 application topically 2 (two) times daily. (Patient not taking: Reported on 10/02/2022) 85 g 0   No current facility-administered medications for this visit.    OBJECTIVE: Vitals:   04/24/23 1107  BP: 107/79  Pulse: 75  Resp: 16  Temp: (!) 97.5 F (36.4 C)  SpO2: 98%     Body mass index is 20.91 kg/m.    ECOG FS:0 - Asymptomatic  General: Well-developed, well-nourished, no acute distress.  Sitting in a wheelchair. Eyes: Pink conjunctiva, anicteric sclera. HEENT: Normocephalic, moist mucous membranes. Lungs: No audible wheezing or coughing. Heart: Regular rate and rhythm. Abdomen: Soft, nontender, no obvious distention. Musculoskeletal: No edema, cyanosis, or clubbing. Neuro: Alert, answering all questions appropriately. Cranial nerves grossly intact. Skin: No rashes or petechiae noted. Psych: Normal affect.  LAB RESULTS:  Lab Results  Component Value Date   NA 135 04/17/2023   K 4.1 04/17/2023   CL 98 04/17/2023   CO2 29 04/17/2023   GLUCOSE 82 04/17/2023   BUN 9 04/17/2023   CREATININE 0.59 (L) 04/17/2023   CALCIUM 9.5 04/17/2023   PROT 7.1 10/16/2022   ALBUMIN 3.8 10/16/2022   AST 21 10/16/2022   ALT 12 10/16/2022   ALKPHOS 67 10/16/2022   BILITOT 0.9 10/16/2022   GFRNONAA >60 04/17/2023   GFRAA >60 12/18/2019    Lab Results  Component Value Date   WBC 4.8 04/17/2023   NEUTROABS 3.1 04/17/2023   HGB 11.5 (L) 04/17/2023   HCT 34.5 (L)  04/17/2023   MCV 96.1 04/17/2023   PLT 241 04/17/2023   Lab Results  Component Value Date   TOTALPROTELP 7.1 04/17/2023   ALBUMINELP 3.7 04/17/2023   A1GS 0.2 04/17/2023   A2GS 0.6 04/17/2023   BETS 0.9 04/17/2023   GAMS 1.7 04/17/2023   MSPIKE 0.5 (H) 04/17/2023   SPEI Comment 04/17/2023     STUDIES: US Venous Img Lower Bilateral Result Date: 03/29/2023 CLINICAL DATA:  Lower extremity swelling. EXAM: BILATERAL LOWER EXTREMITY VENOUS DOPPLER ULTRASOUND TECHNIQUE: Gray-scale sonography with compression, as well as color and duplex ultrasound, were performed to evaluate the deep venous system(s) from the level of the common femoral vein through the popliteal and proximal calf veins. COMPARISON:  November 24, 2018 FINDINGS: VENOUS Normal compressibility of the common femoral, superficial femoral, and popliteal veins, as well as the visualized calf veins. Visualized portions of profunda femoral vein and great saphenous vein unremarkable. No filling defects to suggest DVT on grayscale or  color Doppler imaging. Doppler waveforms show normal direction of venous flow, normal respiratory plasticity and response to augmentation. Limited views of the contralateral common femoral vein are unremarkable. OTHER None. Limitations: none IMPRESSION: No evidence of DVT within the RIGHT or LEFT lower extremity. Electronically Signed   By: Aram Candela M.D.   On: 03/29/2023 19:13     ASSESSMENT: MGUS, pulmonary nodules.  PLAN:    MGUS: Bone marrow biopsy completed on March 13, 2018 revealed a slightly hypercellular bone marrow with trilineage hematopoiesis.  Patient had 5% plasma cells.  Cytogenetic studies were normal.  Metastatic bone survey on February 02, 2018 revealed widespread osseous lesions of unclear etiology.  Patient's M spike is ranged from 0.5-0.7 since October 2019.  Recently, his M spike was 0.5.  Kappa free light chains have also remained mildly elevated ranging from 19.9-29.1 over the  same timeframe, although his most recent results were within normal limits.  He continues to have no evidence of endorgan damage.  No intervention is needed.  Return to clinic in 1 year with repeat laboratory work and further evaluation.  Pulmonary nodules: Patient's most recent CT scan on July 30, 2022 reviewed independently with bilateral subsolid pulmonary nodules that are unchanged from previous imaging.  The larger is in the right upper lobe measuring 2.9 x 1.8 cm.  Will repeat CT scan in the next 1 to 2 weeks to ensure stability.  Osseous lesions: Per metastatic bone survey in November 2019.  Given results of bone marrow biopsy and a normal PSA, this is not myeloma or prostate cancer.  PET scan is negative.  No further intervention is needed. Peripheral neuropathy: Chronic and unchanged.  Continue follow-up with primary care. Weight loss: CT scan as above.  Monitor.  Patient expressed understanding and was in agreement with this plan. He also understands that He can call clinic at any time with any questions, concerns, or complaints.    Jeralyn Ruths, MD   04/24/2023 12:49 PM

## 2023-05-03 NOTE — Congregational Nurse Program (Signed)
Dept: [phone]   Congregational Nurse Program Note  Date of Encounter: 05/03/2023  Initially had telephone visit with client 05/01/23 after referral from [facility] Social Worker over observed fragility of client and client's concerns. Client confused over insurance status but thinks he has Medicare that covers and that Medicaid and food stamps have lapsed. [place] navigator clarified that he has HMO [employer] and was denied [employer]. No copays and should receive monthly assistance through [employer] for food or utilities of ~$300. Client states that he has requested the money but has not received and would like help with this. Also would like help applying for food stamps.  Into clinic today to nurse only clinic for case management services and requests assistance finding housing where he can manage better as he doesn't feel safe at home due to his health and recent deterioration of his family home. He lives alone. Has no central heat. Laundry and toilet aren't draining but sinks do. Concern over limited food access. Has a Large past due with cut off notice electric bill because he is using space heaters and is unable to pay--[employer] assisted with payment and food today. Housing Authority Application completed to request handicap apartment; [facility] referral made to increase resources and support in home while on waiting list. PCP [facility]. Chart review shows needs follow up CT scan of lungs after 04/24/23 clinic visit to [facility] for nodes. Uncertain when next PCP appointment is and would like nurse to assist with clarification on this. Had extensive spine surgery last spring and was hospitalized 2-3 months (discharged June 2024 per patient) that he has never rehabed well from. He walks hunched over his walker and takes extensive time to walk. States he's not sleeping well and that he has a knot in his left shoulder causing pain. States he hasn't taken any medications  for a long time.  Reports he is "borderline diabetic". fasting glucose 97 today. BP 116/68. Throughout much of encounter client kept dozing and would need to be alerted to continue discussions. Provided ginger ale and banana which successfully helped in arousing client. States he doesn't doze while driving. Nurse reinforced importance of needing to be alert to drive. Client was alert and engaged when he left clinic. States he has an appt Feb 3 at [facility] but isn't sure why. Nurse will follow up with client to complete Food Stamp Application and outcome of [facility]ACE referral. Client signed consent for exchange of information with Location manager. Continued coordination of care to find more suitable housing. Client lists Tayquan Gassman (brother) at [phone] as emergency contact. Rhermann RN Past Medical History: Past Medical History:  Diagnosis Date   Acute metabolic encephalopathy 07/10/2022   Borderline diabetic    Cancer (HCC)    Elevated liver enzymes 07/10/2022   GERD (gastroesophageal reflux disease)     Encounter Details:  Community Questionnaire - 05/03/23 1030       Questionnaire   Ask client: Do you give verbal consent for me to treat you today? Yes    Student Assistance N/A    Location Patient Information systems manager, [employer]    Encounter Setting CN site    Population Status Unknown   lives in family's house rent free; no central heat, plumbing issues   Insurance Medicare    Insurance/Financial Assistance Referral N/A    Medication N/A    Medical Provider Yes    Screening Referrals Made N/A    Medical  Referrals Made N/A    Medical Appointment Completed N/A    CNP Interventions Advocate/Support;Navigate Healthcare System;Case Management;Educate    Screenings CN Performed Blood Pressure;Blood Glucose    ED Visit Averted N/A    Life-Saving Intervention Made N/A

## 2023-05-08 ENCOUNTER — Telehealth: Payer: Self-pay | Admitting: Oncology

## 2023-05-08 ENCOUNTER — Ambulatory Visit: Admission: RE | Admit: 2023-05-08 | Payer: Medicare HMO | Source: Ambulatory Visit

## 2023-05-08 NOTE — Telephone Encounter (Signed)
Patient left a VM with answering service that he wants to cancel his 2/3 CT appt and wants someone to call him back to reschedule. Team was notified.

## 2023-05-17 NOTE — Congregational Nurse Program (Signed)
  Dept: [phone]   Congregational Nurse Program Note  Date of Encounter: 05/17/2023 Follow up call to client to update status. Client had cancelled CT scan scheduled but has plan to reschedule ASAP. Plumbing is still problematic. Needs new housing situation. Working with [place] [employer] on this--he needs social card and birth certificate to complete the intake process.  [facility] referral in process. Client agrees to home visits through this program. He understands need to apply for Food Stamps. States he is out of money because when his check came he had to pay back his brother that he had borrowed for gas and food last month.  Follow up as needed. Rhermann, RN Past Medical History: Past Medical History:  Diagnosis Date   Acute metabolic encephalopathy 07/10/2022   Borderline diabetic    Cancer (HCC)    Elevated liver enzymes 07/10/2022   GERD (gastroesophageal reflux disease)     Encounter Details:  Community Questionnaire - 05/17/23 1401       Questionnaire   Ask client: Do you give verbal consent for me to treat you today? Yes    Student Assistance N/A    Location Patient Information systems manager, [employer]    Encounter Setting Phone/Text/Email    Population Status Unknown   lives in family's house rent free; no central heat, plumbing issues   Insurance Medicare    Insurance/Financial Assistance Referral N/A    Medication N/A    Medical Provider Yes    Screening Referrals Made N/A    Medical Referrals Made N/A    Medical Appointment Completed N/A    CNP Interventions Advocate/Support;Navigate Healthcare System;Case Management;Educate    Screenings CN Performed Blood Pressure;Blood Glucose    ED Visit Averted N/A    Life-Saving Intervention Made N/A

## 2023-05-18 ENCOUNTER — Encounter: Payer: Self-pay | Admitting: Oncology

## 2023-05-26 ENCOUNTER — Ambulatory Visit: Payer: Medicare Other | Attending: Oncology

## 2023-05-31 NOTE — Congregational Nurse Program (Signed)
  Dept: [phone]   Congregational Nurse Program Note  Date of Encounter: 05/31/2023  Client into [employer] for food and information re Medicaid status and support for an independent living setting that is handicap accessible and supportive of his increasing medical needs and limitations. Remained in truck during conversation. Joint meeting with Frank Buckley, [employer]. Discussed at length the pending [facility] Service community services for elderly) referral and offered a voluntary DSS Adult Services referral. With reluctance, Frank Buckley agreed to nurse making  the DSS referral on his behalf. Nurse left a voice mail message for Frank Buckley, Adult Dealer at [facility] DSS [phone]). Client has rescheduled his outstanding scan from cancer center clinic visit last month to this Friday. States they told him there would be a $175 up front charge but they could bill him. Nurse encouraged him to keep this appointment. Frank Buckley received a text from [facility] of what text meant but it looks like he has been made eligible for [place] Medicaid. Nurse to follow up with [facility] Navigator to verity his status. Client needs food stamps. Nurse will follow up with client re this application process.  Nurse explained to Frank Buckley that she is transitioning the navigation of medical services portion of his care to Frank Flavin, RN. RJHermann, RN  Past Medical History: Past Medical History:  Diagnosis Date   Acute metabolic encephalopathy 07/10/2022   Borderline diabetic    Cancer (HCC)    Elevated liver enzymes 07/10/2022   GERD (gastroesophageal reflux disease)     Encounter Details:  Community Questionnaire - 05/31/23 1600       Questionnaire   Ask client: Do you give verbal consent for me to treat you today? Yes    Student Assistance N/A    Location Patient Information systems manager, [employer]    Encounter Setting CN site    Population Status  Unknown   lives in family's house rent free; no central heat, plumbing issues   Insurance Medicare;Medicaid    Insurance/Financial Assistance Referral N/A    Medication N/A    Medical Provider Yes    Screening Referrals Made N/A    Medical Referrals Made N/A    Medical Appointment Completed N/A    CNP Interventions Advocate/Support;Navigate Healthcare System;Case Management;Educate    Screenings CN Performed N/A    ED Visit Averted N/A    Life-Saving Intervention Made N/A

## 2023-06-01 ENCOUNTER — Encounter: Payer: Self-pay | Admitting: Oncology

## 2023-06-02 ENCOUNTER — Ambulatory Visit
Admission: RE | Admit: 2023-06-02 | Discharge: 2023-06-02 | Disposition: A | Discharge status: Home / Self Care | Payer: Medicare Other | Source: Ambulatory Visit | Attending: Oncology | Admitting: Oncology

## 2023-06-02 DIAGNOSIS — R918 Other nonspecific abnormal finding of lung field: Secondary | ICD-10-CM | POA: Diagnosis present

## 2023-06-02 MED ORDER — IOHEXOL 300 MG/ML  SOLN
75.0000 mL | Freq: Once | INTRAMUSCULAR | Status: AC | PRN
  Administered 2023-06-02: 75 mL via INTRAVENOUS

## 2023-09-15 ENCOUNTER — Other Ambulatory Visit: Payer: Self-pay

## 2023-09-15 ENCOUNTER — Encounter: Payer: Self-pay | Admitting: Oncology

## 2023-09-15 ENCOUNTER — Emergency Department

## 2023-09-15 ENCOUNTER — Inpatient Hospital Stay
Admission: EM | Admit: 2023-09-15 | Discharge: 2023-09-18 | DRG: 603 | Disposition: A | Discharge status: Home Health | Attending: Internal Medicine | Admitting: Internal Medicine

## 2023-09-15 DIAGNOSIS — Z981 Arthrodesis status: Secondary | ICD-10-CM | POA: Diagnosis not present

## 2023-09-15 DIAGNOSIS — G959 Disease of spinal cord, unspecified: Secondary | ICD-10-CM | POA: Diagnosis present

## 2023-09-15 DIAGNOSIS — Z809 Family history of malignant neoplasm, unspecified: Secondary | ICD-10-CM

## 2023-09-15 DIAGNOSIS — Z8249 Family history of ischemic heart disease and other diseases of the circulatory system: Secondary | ICD-10-CM

## 2023-09-15 DIAGNOSIS — Z79899 Other long term (current) drug therapy: Secondary | ICD-10-CM

## 2023-09-15 DIAGNOSIS — R296 Repeated falls: Secondary | ICD-10-CM | POA: Diagnosis present

## 2023-09-15 DIAGNOSIS — K219 Gastro-esophageal reflux disease without esophagitis: Secondary | ICD-10-CM | POA: Diagnosis present

## 2023-09-15 DIAGNOSIS — N400 Enlarged prostate without lower urinary tract symptoms: Secondary | ICD-10-CM | POA: Diagnosis present

## 2023-09-15 DIAGNOSIS — Z7982 Long term (current) use of aspirin: Secondary | ICD-10-CM | POA: Diagnosis not present

## 2023-09-15 DIAGNOSIS — R54 Age-related physical debility: Secondary | ICD-10-CM | POA: Diagnosis present

## 2023-09-15 DIAGNOSIS — D509 Iron deficiency anemia, unspecified: Secondary | ICD-10-CM | POA: Diagnosis present

## 2023-09-15 DIAGNOSIS — R918 Other nonspecific abnormal finding of lung field: Secondary | ICD-10-CM | POA: Diagnosis present

## 2023-09-15 DIAGNOSIS — D472 Monoclonal gammopathy: Secondary | ICD-10-CM | POA: Diagnosis present

## 2023-09-15 DIAGNOSIS — L03115 Cellulitis of right lower limb: Secondary | ICD-10-CM | POA: Diagnosis present

## 2023-09-15 DIAGNOSIS — E785 Hyperlipidemia, unspecified: Secondary | ICD-10-CM | POA: Diagnosis present

## 2023-09-15 DIAGNOSIS — R911 Solitary pulmonary nodule: Secondary | ICD-10-CM | POA: Diagnosis present

## 2023-09-15 DIAGNOSIS — D1722 Benign lipomatous neoplasm of skin and subcutaneous tissue of left arm: Secondary | ICD-10-CM | POA: Diagnosis present

## 2023-09-15 DIAGNOSIS — M7989 Other specified soft tissue disorders: Secondary | ICD-10-CM

## 2023-09-15 DIAGNOSIS — Z880 Allergy status to penicillin: Secondary | ICD-10-CM | POA: Diagnosis not present

## 2023-09-15 DIAGNOSIS — R600 Localized edema: Secondary | ICD-10-CM

## 2023-09-15 DIAGNOSIS — E162 Hypoglycemia, unspecified: Secondary | ICD-10-CM | POA: Diagnosis present

## 2023-09-15 DIAGNOSIS — Z87891 Personal history of nicotine dependence: Secondary | ICD-10-CM | POA: Diagnosis not present

## 2023-09-15 DIAGNOSIS — M6282 Rhabdomyolysis: Secondary | ICD-10-CM | POA: Diagnosis present

## 2023-09-15 LAB — BASIC METABOLIC PANEL WITH GFR
Anion gap: 9 (ref 5–15)
BUN: 14 mg/dL (ref 8–23)
CO2: 30 mmol/L (ref 22–32)
Calcium: 8.8 mg/dL — ABNORMAL LOW (ref 8.9–10.3)
Chloride: 96 mmol/L — ABNORMAL LOW (ref 98–111)
Creatinine, Ser: 0.76 mg/dL (ref 0.61–1.24)
GFR, Estimated: >60 mL/min (ref >60)
Glucose, Bld: 87 mg/dL (ref 70–99)
Potassium: 3.5 mmol/L (ref 3.5–5.1)
Sodium: 135 mmol/L (ref 135–145)

## 2023-09-15 LAB — CBC WITH DIFFERENTIAL/PLATELET
Abs Immature Granulocytes: 0.01 10*3/uL (ref 0.00–0.07)
Basophils Absolute: 0 10*3/uL (ref 0.0–0.1)
Basophils Relative: 1 %
Eosinophils Absolute: 0.4 10*3/uL (ref 0.0–0.5)
Eosinophils Relative: 7 %
HCT: 32.1 % — ABNORMAL LOW (ref 39.0–52.0)
Hemoglobin: 10.8 g/dL — ABNORMAL LOW (ref 13.0–17.0)
Immature Granulocytes: 0 %
Lymphocytes Relative: 22 %
Lymphs Abs: 1.3 10*3/uL (ref 0.7–4.0)
MCH: 32.2 pg (ref 26.0–34.0)
MCHC: 33.6 g/dL (ref 30.0–36.0)
MCV: 95.8 fL (ref 80.0–100.0)
Monocytes Absolute: 0.5 10*3/uL (ref 0.1–1.0)
Monocytes Relative: 9 %
Neutro Abs: 3.5 10*3/uL (ref 1.7–7.7)
Neutrophils Relative %: 61 %
Platelets: 229 10*3/uL (ref 150–400)
RBC: 3.35 MIL/uL — ABNORMAL LOW (ref 4.22–5.81)
RDW: 12.6 % (ref 11.5–15.5)
WBC: 5.8 10*3/uL (ref 4.0–10.5)
nRBC: 0 % (ref 0.0–0.2)

## 2023-09-15 LAB — URINALYSIS, COMPLETE (UACMP) WITH MICROSCOPIC
Bacteria, UA: NONE SEEN
Bilirubin Urine: NEGATIVE
Glucose, UA: NEGATIVE mg/dL
Hgb urine dipstick: NEGATIVE
Ketones, ur: NEGATIVE mg/dL
Leukocytes,Ua: NEGATIVE
Nitrite: NEGATIVE
Protein, ur: NEGATIVE mg/dL
Specific Gravity, Urine: 1.006 (ref 1.005–1.030)
pH: 9 — ABNORMAL HIGH (ref 5.0–8.0)

## 2023-09-15 LAB — LACTIC ACID, PLASMA: Lactic Acid, Venous: 1.2 mmol/L (ref 0.5–1.9)

## 2023-09-15 LAB — CK: Total CK: 356 U/L (ref 49–397)

## 2023-09-15 MED ORDER — PANTOPRAZOLE SODIUM 40 MG PO TBEC
40.0000 mg | DELAYED_RELEASE_TABLET | Freq: Every day | ORAL | Status: DC
  Administered 2023-09-16 – 2023-09-18 (×3): 40 mg via ORAL
  Filled 2023-09-15 (×3): qty 1

## 2023-09-15 MED ORDER — ACETAMINOPHEN 650 MG RE SUPP
650.0000 mg | Freq: Four times a day (QID) | RECTAL | Status: AC | PRN
Start: 2023-09-15 — End: 2023-09-20

## 2023-09-15 MED ORDER — DOCUSATE SODIUM 100 MG PO CAPS
100.0000 mg | ORAL_CAPSULE | Freq: Every day | ORAL | Status: DC
  Administered 2023-09-16 – 2023-09-18 (×3): 100 mg via ORAL
  Filled 2023-09-15 (×3): qty 1

## 2023-09-15 MED ORDER — SODIUM CHLORIDE 0.9 % IV SOLN
2.0000 g | INTRAVENOUS | Status: DC
  Administered 2023-09-16 – 2023-09-18 (×3): 2 g via INTRAVENOUS
  Filled 2023-09-15 (×3): qty 20

## 2023-09-15 MED ORDER — ACETAMINOPHEN 325 MG PO TABS
650.0000 mg | ORAL_TABLET | Freq: Four times a day (QID) | ORAL | Status: DC | PRN
  Administered 2023-09-16 – 2023-09-18 (×3): 650 mg via ORAL
  Filled 2023-09-15 (×3): qty 2

## 2023-09-15 MED ORDER — DEXTROSE 50 % IV SOLN: 1.0000 | INTRAVENOUS | Status: DC | PRN

## 2023-09-15 MED ORDER — ONDANSETRON HCL 4 MG PO TABS: 4.0000 mg | ORAL_TABLET | Freq: Four times a day (QID) | ORAL | Status: DC | PRN

## 2023-09-15 MED ORDER — SODIUM CHLORIDE 0.9 % IV BOLUS
1000.0000 mL | Freq: Once | INTRAVENOUS | Status: AC
  Administered 2023-09-15: 1000 mL via INTRAVENOUS

## 2023-09-15 MED ORDER — SODIUM CHLORIDE 0.9 % IV SOLN
2.0000 g | Freq: Once | INTRAVENOUS | Status: AC
  Administered 2023-09-15: 2 g via INTRAVENOUS

## 2023-09-15 MED ORDER — SODIUM CHLORIDE 0.9 % IV SOLN
2.0000 g | INTRAVENOUS | Status: DC
  Filled 2023-09-15: qty 20

## 2023-09-15 MED ORDER — ASPIRIN 81 MG PO CHEW
81.0000 mg | CHEWABLE_TABLET | Freq: Every day | ORAL | Status: DC
  Administered 2023-09-16 – 2023-09-18 (×3): 81 mg via ORAL
  Filled 2023-09-15 (×3): qty 1

## 2023-09-15 MED ORDER — MELATONIN 5 MG PO TABS
5.0000 mg | ORAL_TABLET | Freq: Every day | ORAL | Status: DC
  Administered 2023-09-16 – 2023-09-17 (×2): 5 mg via ORAL
  Filled 2023-09-15 (×2): qty 1

## 2023-09-15 MED ORDER — SENNOSIDES-DOCUSATE SODIUM 8.6-50 MG PO TABS: 1.0000 | ORAL_TABLET | Freq: Every evening | ORAL | Status: DC | PRN

## 2023-09-15 MED ORDER — MELATONIN 3 MG PO TABS
6.0000 mg | ORAL_TABLET | Freq: Every day | ORAL | Status: DC
  Filled 2023-09-15: qty 2

## 2023-09-15 MED ORDER — ONDANSETRON HCL 4 MG/2ML IJ SOLN: 4.0000 mg | Freq: Four times a day (QID) | INTRAMUSCULAR | Status: DC | PRN

## 2023-09-15 MED ORDER — HEPARIN SODIUM (PORCINE) 5000 UNIT/ML IJ SOLN
5000.0000 [IU] | Freq: Three times a day (TID) | INTRAMUSCULAR | Status: DC
  Administered 2023-09-15 – 2023-09-18 (×8): 5000 [IU] via SUBCUTANEOUS
  Filled 2023-09-15 (×8): qty 1

## 2023-09-15 MED ORDER — POLYETHYLENE GLYCOL 3350 17 G PO PACK
17.0000 g | PACK | Freq: Two times a day (BID) | ORAL | Status: DC
  Administered 2023-09-15 – 2023-09-18 (×5): 17 g via ORAL
  Filled 2023-09-15 (×5): qty 1

## 2023-09-15 MED ORDER — METHOCARBAMOL 500 MG PO TABS
500.0000 mg | ORAL_TABLET | Freq: Four times a day (QID) | ORAL | Status: DC | PRN
  Administered 2023-09-18: 500 mg via ORAL
  Filled 2023-09-15: qty 1

## 2023-09-15 NOTE — Assessment & Plan Note (Signed)
Continue outpatient follow-up with hematologist as appropriate

## 2023-09-15 NOTE — ED Notes (Signed)
 See triage note  Presents from PCP's office  Swelling to right leg  Denies any injury

## 2023-09-15 NOTE — ED Triage Notes (Signed)
 Pt to ED via POV from [facility] to be evaluated for blood clots due to right leg swelling. Pt A&O, NAD.

## 2023-09-15 NOTE — ED Provider Notes (Signed)
 [facility] Provider Note    Event Date/Time   First MD Initiated Contact with Patient 09/15/23 1342     (approximate)   History   Leg Swelling   HPI  Frank Buckley is a 78 y.o. male with a past medical history of MGUS, GERD, hyperlipidemia, myelopathy who presents today for evaluation of right lower extremity swelling and pain.  Patient reports that this has been ongoing for the past several weeks.  He reports that he went to his outpatient provider and they put him on doxycycline for a week but he feels that the symptoms are worsening.  He reports that he has been feeling weak and has had multiple falls at home over the past week.  He does not think that he has struck his head.  He denies any injuries from the falls.  He went to [facility] earlier today and they sent him to the emergency department for further workup.  Patient reports that he lives alone.  Patient denies history of PE/DVT.  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   Myelopathy (HCC) 08/02/2022   Non-traumatic rhabdomyolysis 07/30/2022   Chest pain 07/30/2022   Cervical myelopathy (HCC) 07/12/2022   Hypoglycemia 07/11/2022   Hypokalemia 07/11/2022   Hyperlipidemia 07/10/2022   GERD (gastroesophageal reflux disease) 07/10/2022   SIRS (systemic inflammatory response syndrome) (HCC) 07/10/2022   BPH (benign prostatic hyperplasia) 07/10/2022   Left foot pain 07/10/2022   IDA (iron deficiency anemia) 06/25/2020   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 06/04/2018   MGUS (monoclonal gammopathy of unknown significance) 01/29/2018   Abnormal CT scan 01/21/2018         Physical Exam   Triage Vital Signs: ED Triage Vitals  Encounter Vitals Group     BP 09/15/23 1234 (!) 99/59     Girls Systolic BP Percentile --      Girls Diastolic BP Percentile --      Boys Systolic  BP Percentile --      Boys Diastolic BP Percentile --      Pulse Rate 09/15/23 1234 67     Resp 09/15/23 1234 16     Temp 09/15/23 1234 97.9 F (36.6 C)     Temp Source 09/15/23 1234 Oral     SpO2 09/15/23 1234 98 %     Weight 09/15/23 1238 148 lb (67.1 kg)     Height 09/15/23 1238 5' 9 (1.753 m)     Head Circumference --      Peak Flow --      Pain Score 09/15/23 1235 0     Pain Loc --      Pain Education --      Exclude from Growth Chart --     Most recent vital signs: Vitals:   09/15/23 1234 09/15/23 1735  BP: (!) 99/59 (!) 142/70  Pulse: 67 61  Resp: 16 15  Temp: 97.9 F (36.6 C) (!) 97.5 F (36.4 C)  SpO2: 98% 99%    Physical Exam Vitals and nursing note reviewed.  Constitutional:      General: Awake and alert. No acute distress.    Appearance: Normal appearance. The patient is normal weight.  HENT:     Head: Normocephalic and atraumatic.     Mouth: Mucous membranes are moist.  Eyes:     General: PERRL. Normal EOMs  Right eye: No discharge.        Left eye: No discharge.     Conjunctiva/sclera: Conjunctivae normal.  Cardiovascular:     Rate and Rhythm: Normal rate and regular rhythm.     Pulses: Normal pulses.  Pulmonary:     Effort: Pulmonary effort is normal. No respiratory distress.     Breath sounds: Normal breath sounds.  Abdominal:     Abdomen is soft. There is no abdominal tenderness. No rebound or guarding. No distention. Musculoskeletal:        General: No swelling. Normal range of motion.     Cervical back: Normal range of motion and neck supple.  Right lower extremity: Skin sloughing noted throughout lower extremity, warm and unilateral swelling noted to lower leg with lymphangitis to mid upper thigh.  No crepitus.  Foot is warm, equal to opposite. Skin:    General: Skin is warm and dry.     Capillary Refill: Capillary refill takes less than 2 seconds.     Findings: No rash.  Neurological:     Mental Status: The patient is awake and  alert.      ED Results / Procedures / Treatments   Labs (all labs ordered are listed, but only abnormal results are displayed) Labs Reviewed  CBC WITH DIFFERENTIAL/PLATELET - Abnormal; Notable for the following components:      Result Value   RBC 3.35 (*)    Hemoglobin 10.8 (*)    HCT 32.1 (*)    All other components within normal limits  BASIC METABOLIC PANEL WITH GFR - Abnormal; Notable for the following components:   Chloride 96 (*)    Calcium 8.8 (*)    All other components within normal limits  CULTURE, BLOOD (ROUTINE X 2)  CULTURE, BLOOD (ROUTINE X 2)  LACTIC ACID, PLASMA  BASIC METABOLIC PANEL WITH GFR  CBC  CK  URINALYSIS, COMPLETE (UACMP) WITH MICROSCOPIC     EKG     RADIOLOGY I independently reviewed and interpreted imaging and agree with radiologists findings.     PROCEDURES:  Critical Care performed:   Procedures   MEDICATIONS ORDERED IN ED: Medications  heparin  injection 5,000 Units (has no administration in time range)  senna-docusate (Senokot-S) tablet 1 tablet (has no administration in time range)  ondansetron  (ZOFRAN ) tablet 4 mg (has no administration in time range)    Or  ondansetron  (ZOFRAN ) injection 4 mg (has no administration in time range)  acetaminophen  (TYLENOL ) tablet 650 mg (has no administration in time range)    Or  acetaminophen  (TYLENOL ) suppository 650 mg (has no administration in time range)  cefTRIAXone  (ROCEPHIN ) 2 g in sodium chloride  0.9 % 100 mL IVPB (has no administration in time range)  dextrose  50 % solution 50 mL (has no administration in time range)  cefTRIAXone  (ROCEPHIN ) 2 g in sodium chloride  0.9 % 100 mL IVPB (2 g Intravenous New Bag/Given 09/15/23 1735)  sodium chloride  0.9 % bolus 1,000 mL (1,000 mLs Intravenous New Bag/Given 09/15/23 1727)     IMPRESSION / MDM / ASSESSMENT AND PLAN / ED COURSE  I reviewed the triage vital signs and the nursing notes.   Differential diagnosis includes, but is not  limited to, DVT, cellulitis, lymphangitis, SIRS.  Patient is awake and alert, hypotensive though afebrile.  He is chronically ill-appearing.  He does have unilateral leg swelling to his right lower extremity with circumferential erythema lymphangitis to his right upper inner thigh.  Labs are overall reassuring.  No pain out  of proportion or crepitus to suggest deep space or necrotizing infection.  Ultrasound obtained for evaluation of DVT given unilateral leg swelling, this was negative.  Given patient's soft blood pressure on arrival of 99/59, he is warm and erythematous right lower extremity with lymphangitis, and his frequent falls at home while living by himself, as well as being on p.o. doxycycline for 1 week, I am concerned about worsening cellulitis and recommend admission to the hospitalist service for IV antibiotics and continued management.  Patient is in agreement with this plan.  I consulted hospitalist for admission.  Patient was accepted by Dr. Seleta Dakins.  Patient's presentation is most consistent with acute presentation with potential threat to life or bodily function.      FINAL CLINICAL IMPRESSION(S) / ED DIAGNOSES   Final diagnoses:  Cellulitis of right lower extremity  Right leg swelling  Frequent falls     Rx / DC Orders   ED Discharge Orders     None        Note:  This document was prepared using Dragon voice recognition software and may include unintentional dictation errors.   Ailsa Mireles E, PA-C 09/15/23 1741    Iver Marker, MD 09/20/23 1040

## 2023-09-15 NOTE — Hospital Course (Signed)
 [name] is a 78 year old male with history of GERD, hyperlipidemia, MGUS, myelopathy, BPH, iron deficiency anemia, presents emergency department for chief concerns of right leg swelling.  Patient was sent from [facility] for concerns of DVT.  Vitals in the ED showed T 97.9, respiration rate 16, heart rate 67, blood pressure 99/89, SpO2 of 98% room air.  Serum sodium is 135, potassium 3.5, chloride 96, bicarb 30, BUN of 14, serum creatinine 0.76, EGFR greater than 60, nonfasting glucose 87, WBC 5.8, hemoglobin 10.8, platelets of 229.  Ultrasound of bilateral lower extremity was read as negative for evidence of DVT  ED treatment: Ceftriaxone  2 g IV one-time dose, sodium chloride  1 L bolus.

## 2023-09-15 NOTE — Assessment & Plan Note (Signed)
 D50 as needed for hypoglycemia

## 2023-09-15 NOTE — Assessment & Plan Note (Signed)
 Check CK level on admission

## 2023-09-15 NOTE — H&P (Addendum)
 History and Physical   Frank Buckley QQV:[id number] DOB: 1945-09-19 DOA: 09/15/2023  PCP: Macie Saxon, MD Patient coming from: Home  I have personally briefly reviewed patient's old medical records in [facility] EMR.  Chief Concern: Right leg swelling  HPI: [name] is a 78 year old male with history of GERD, hyperlipidemia, MGUS, myelopathy, BPH, iron deficiency anemia, presents emergency department for chief concerns of right leg swelling.  Patient was sent from [facility] for concerns of DVT.  Vitals in the ED showed T 97.9, respiration rate 16, heart rate 67, blood pressure 99/89, SpO2 of 98% room air.  Serum sodium is 135, potassium 3.5, chloride 96, bicarb 30, BUN of 14, serum creatinine 0.76, EGFR greater than 60, nonfasting glucose 87, WBC 5.8, hemoglobin 10.8, platelets of 229.  Ultrasound of bilateral lower extremity was read as negative for evidence of DVT  ED treatment: Ceftriaxone  2 g IV one-time dose, sodium chloride  1 L bolus. ---------------------------------- At bedside, patient able to tell me his name, age, current location, current year.   He reports his right leg has been swelling and hurting himf or about 1 month.  He endorses dysuria for 'a while'. He endorses chills and denies fever, nausea, vomiting. He denies chest pain, shortness of breath.   Social history: He lives on his own. He former tobacco user, quitting 20 years. He denies alcohol use. He denies drug use. He is retired and formerly was a Psychologist, occupational.  ROS: Constitutional: no weight change, no fever ENT/Mouth: no sore throat, no rhinorrhea Eyes: no eye pain, no vision changes Cardiovascular: no chest pain, no dyspnea,  no edema, no palpitations Respiratory: no cough, no sputum, no wheezing Gastrointestinal: no nausea, no vomiting, no diarrhea, no constipation Genitourinary: no urinary incontinence, + dysuria, no hematuria Musculoskeletal: no arthralgias, no myalgias Skin:  no skin lesions, no pruritus, + right lower leg warmth Neuro: + weakness, no loss of consciousness, no syncope Psych: no anxiety, no depression, + decrease appetite Heme/Lymph: no bruising, no bleeding  ED Course: Discussed with EDP, patient requiring hospitalization for chief concerns of right leg cellulitis failing outpatient therapy.  Assessment/Plan  Principal Problem:   Cellulitis of right leg Active Problems:   MGUS (monoclonal gammopathy of unknown significance)   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   IDA (iron deficiency anemia)   Hyperlipidemia   GERD (gastroesophageal reflux disease)   BPH (benign prostatic hyperplasia)   Hypoglycemia   Non-traumatic rhabdomyolysis   Myelopathy (HCC)   Pulmonary nodules   Assessment and Plan:  * Cellulitis of right leg Failed outpatient therapy Blood cultures x 2 are in process Ceftriaxone  2 g IV daily  Pulmonary nodules Bilateral subsolid pulmonary nodule Continue outpatient follow-up with oncologist as appropriate  Non-traumatic rhabdomyolysis Check CK level on admission  Hypoglycemia D50 as needed for hypoglycemia  MGUS (monoclonal gammopathy of unknown significance) Continue outpatient follow-up with hematologist as appropriate  Pending med reconciliation.  AM team to complete med reconciliation  Chart reviewed.   DVT prophylaxis: Heparin  5000 units subcutaneous every 8 hours Code Status: Full code Diet: Heart healthy Family Communication: A phone call was offered, patient declined stating that he will call his brother Disposition Plan: Pending clinical course Consults called: None at this time Admission status: Telemetry medical, inpatient  Past Medical History:  Diagnosis Date   Acute metabolic encephalopathy 07/10/2022   Borderline diabetic    Cancer (HCC)    Elevated liver enzymes 07/10/2022   GERD (gastroesophageal reflux disease)  Past Surgical History:  Procedure Laterality Date   APPLICATION OF  INTRAOPERATIVE CT SCAN N/A 08/03/2022   Procedure: APPLICATION OF INTRAOPERATIVE CT SCAN;  Surgeon: Jodeen Munch, MD;  Location: [facility] ORS;  Service: Neurosurgery;  Laterality: N/A;   CERVICAL FUSION     POSTERIOR CERVICAL FUSION/FORAMINOTOMY N/A 08/03/2022   Procedure: POSTERIOR CERVICAL FUSION/FORAMINOTOMY LEVEL 3;  Surgeon: Jodeen Munch, MD;  Location: [facility] ORS;  Service: Neurosurgery;  Laterality: N/A;  C2-3 PSFD with C2-4 instrumentation   Social History:  reports that he has quit smoking. He has never used smokeless tobacco. He reports that he does not drink alcohol and does not use drugs.  Allergies  Allergen Reactions   Penicillins Rash   Family History  Problem Relation Age of Onset   Hypertension Mother    Heart disease Mother    Cancer Father    Family history: Family history reviewed and not pertinent.  Prior to Admission medications   Medication Sig Start Date End Date Taking? Authorizing Provider  acetaminophen  (TYLENOL ) 325 MG tablet Take 2 tablets (650 mg total) by mouth every 6 (six) hours as needed for mild pain (or Fever >/= 101). 07/14/22   Ezzard Holms, MD  alum & mag hydroxide-simeth (MAALOX/MYLANTA) 200-200-20 MG/5ML suspension Take 30 mLs by mouth every 4 (four) hours as needed for indigestion. 08/08/22   Montey Apa, DO  aspirin  81 MG chewable tablet Chew 1 tablet (81 mg total) by mouth daily. 08/09/22   Montey Apa, DO  celecoxib  (CELEBREX ) 100 MG capsule Take 1 capsule (100 mg total) by mouth 2 (two) times daily. 08/08/22   Montey Apa, DO  docusate sodium  (COLACE) 100 MG capsule Take 100 mg by mouth daily.    [provider]  doxazosin  (CARDURA ) 8 MG tablet Take 1 tablet (8 mg total) by mouth daily. 05/26/21   Matilde Son A, PA-C  ergocalciferol  (VITAMIN D2) 1.25 MG (50000 UT) capsule Take 1 capsule (50,000 Units total) by mouth once a week. 06/25/20   Aurther Blue, NP  finasteride  (PROSCAR ) 5 MG tablet Take 1 tablet (5 mg  total) by mouth daily. Patient not taking: Reported on 10/02/2022 05/26/21   Matilde Son A, PA-C  melatonin 3 MG TABS tablet Take 6 mg by mouth at bedtime.    [provider]  methocarbamol  (ROBAXIN ) 500 MG tablet Take 1 tablet (500 mg total) by mouth every 6 (six) hours as needed for muscle spasms. 08/08/22   Montey Apa, DO  Miconazole Nitrate (LOTRIMIN  AF POWDER) 2 % AERP Apply 1 application topically 2 (two) times daily. Patient not taking: Reported on 10/02/2022 11/24/18   Suellyn Emory, MD  mirabegron  ER (MYRBETRIQ ) 25 MG TB24 tablet Take 1 tablet (25 mg total) by mouth daily. 03/17/22   Stoioff, Kizzie Perks, MD  omeprazole (PRILOSEC) 20 MG capsule Take 20 mg by mouth daily.    [provider]  polyethylene glycol (MIRALAX  / GLYCOLAX ) 17 g packet Take 17 g by mouth 2 (two) times daily. 08/08/22   Montey Apa, DO   Physical Exam: Vitals:   09/15/23 1234 09/15/23 1238 09/15/23 1735 09/15/23 1814  BP: (!) 99/59  (!) 142/70 (!) 150/70  Pulse: 67  61 63  Resp: 16  15 17   Temp: 97.9 F (36.6 C)  (!) 97.5 F (36.4 C) 97.7 F (36.5 C)  TempSrc: Oral  Oral   SpO2: 98%  99% 100%  Weight:  67.1 kg    Height:  5'  9 (1.753 m)     Constitutional: appears age-appropriate, frail Eyes: PERRL, lids and conjunctivae normal ENMT: Mucous membranes are moist. Posterior pharynx clear of any exudate or lesions. Age-appropriate dentition. Hearing appropriate Neck: normal, supple, no masses, no thyromegaly Respiratory: clear to auscultation bilaterally, no wheezing, no crackles. Normal respiratory effort. No accessory muscle use.  Cardiovascular: Regular rate and rhythm, no murmurs / rubs / gallops. No extremity edema. 2+ pedal pulses. No carotid bruits.  Abdomen: no tenderness, no masses palpated, no hepatosplenomegaly. Bowel sounds positive.  Musculoskeletal: no clubbing / cyanosis. No joint deformity upper and lower extremities. Good ROM, no contractures, no atrophy. Normal  muscle tone.  Skin: Bilateral lower extremity are dry.  Right leg with increased swelling and increased warmth on physical exam.     Neurologic: Sensation intact. Strength 5/5 in all 4.  Psychiatric: Normal judgment and insight. Alert and oriented x 3. Normal mood.   EKG: Not indicated at this time  Chest x-ray on Admission: I personally reviewed and I agree with radiologist reading as below.  US  Venous Img Lower Bilateral (DVT) Result Date: 09/15/2023 CLINICAL DATA:  Bilateral lower extremity edema EXAM: BILATERAL LOWER EXTREMITY VENOUS DOPPLER ULTRASOUND TECHNIQUE: Gray-scale sonography with graded compression, as well as color Doppler and duplex ultrasound were performed to evaluate the lower extremity deep venous systems from the level of the common femoral vein and including the common femoral, femoral, profunda femoral, popliteal and calf veins including the posterior tibial, peroneal and gastrocnemius veins when visible. The superficial great saphenous vein was also interrogated. Spectral Doppler was utilized to evaluate flow at rest and with distal augmentation maneuvers in the common femoral, femoral and popliteal veins. COMPARISON:  None Available. FINDINGS: RIGHT LOWER EXTREMITY Common Femoral Vein: No evidence of thrombus. Normal compressibility, respiratory phasicity and response to augmentation. Saphenofemoral Junction: No evidence of thrombus. Normal compressibility and flow on color Doppler imaging. Profunda Femoral Vein: No evidence of thrombus. Normal compressibility and flow on color Doppler imaging. Femoral Vein: No evidence of thrombus. Normal compressibility, respiratory phasicity and response to augmentation. Popliteal Vein: No evidence of thrombus. Normal compressibility, respiratory phasicity and response to augmentation. Calf Veins: No evidence of thrombus. Normal compressibility and flow on color Doppler imaging. Superficial Great Saphenous Vein: No evidence of thrombus.  Normal compressibility. Venous Reflux:  None. Other Findings:  None. LEFT LOWER EXTREMITY Common Femoral Vein: No evidence of thrombus. Normal compressibility, respiratory phasicity and response to augmentation. Saphenofemoral Junction: No evidence of thrombus. Normal compressibility and flow on color Doppler imaging. Profunda Femoral Vein: No evidence of thrombus. Normal compressibility and flow on color Doppler imaging. Femoral Vein: No evidence of thrombus. Normal compressibility, respiratory phasicity and response to augmentation. Popliteal Vein: No evidence of thrombus. Normal compressibility, respiratory phasicity and response to augmentation. Calf Veins: No evidence of thrombus. Normal compressibility and flow on color Doppler imaging. Superficial Great Saphenous Vein: No evidence of thrombus. Normal compressibility. Venous Reflux:  None. Other Findings:  None. IMPRESSION: No evidence of deep venous thrombosis in either lower extremity. Electronically Signed   By: Fernando Hoyer M.D.   On: 09/15/2023 16:29   Labs on Admission: I have personally reviewed following labs  CBC: Recent Labs  Lab 09/15/23 1245  WBC 5.8  NEUTROABS 3.5  HGB 10.8*  HCT 32.1*  MCV 95.8  PLT 229   Basic Metabolic Panel: Recent Labs  Lab 09/15/23 1245  NA 135  K 3.5  CL 96*  CO2 30  GLUCOSE 87  BUN 14  CREATININE 0.76  CALCIUM 8.8*   GFR: Estimated Creatinine Clearance: 73.4 mL/min (by C-G formula based on SCr of 0.76 mg/dL).  Urine analysis:    Component Value Date/Time   COLORURINE STRAW (A) 09/15/2023 1757   APPEARANCEUR CLEAR (A) 09/15/2023 1757   APPEARANCEUR Clear 03/17/2022 1414   LABSPEC 1.006 09/15/2023 1757   LABSPEC 1.006 04/28/2014 1047   PHURINE 9.0 (H) 09/15/2023 1757   GLUCOSEU NEGATIVE 09/15/2023 1757   GLUCOSEU Negative 04/28/2014 1047   HGBUR NEGATIVE 09/15/2023 1757   BILIRUBINUR NEGATIVE 09/15/2023 1757   BILIRUBINUR Negative 03/17/2022 1414   BILIRUBINUR Negative  04/28/2014 1047   KETONESUR NEGATIVE 09/15/2023 1757   PROTEINUR NEGATIVE 09/15/2023 1757   NITRITE NEGATIVE 09/15/2023 1757   LEUKOCYTESUR NEGATIVE 09/15/2023 1757   LEUKOCYTESUR Negative 04/28/2014 1047   This document was prepared using Dragon Voice Recognition software and may include unintentional dictation errors.  Dr. Reinhold Carbine [facility]  If 7PM-7AM, please contact overnight-coverage provider If 7AM-7PM, please contact day attending provider [email or web address]  09/15/2023, 6:33 PM

## 2023-09-15 NOTE — Assessment & Plan Note (Signed)
 Bilateral subsolid pulmonary nodule Continue outpatient follow-up with oncologist as appropriate

## 2023-09-15 NOTE — Assessment & Plan Note (Addendum)
     Failed outpatient therapy Blood cultures x 2 are in process Ceftriaxone  2 g IV daily

## 2023-09-15 NOTE — ED Notes (Signed)
 Blue top drawn and sent to lab.

## 2023-09-16 DIAGNOSIS — L03115 Cellulitis of right lower limb: Secondary | ICD-10-CM | POA: Diagnosis not present

## 2023-09-16 LAB — BASIC METABOLIC PANEL WITH GFR
Anion gap: 7 (ref 5–15)
BUN: 12 mg/dL (ref 8–23)
CO2: 29 mmol/L (ref 22–32)
Calcium: 8.7 mg/dL — ABNORMAL LOW (ref 8.9–10.3)
Chloride: 102 mmol/L (ref 98–111)
Creatinine, Ser: 0.59 mg/dL — ABNORMAL LOW (ref 0.61–1.24)
GFR, Estimated: >60 mL/min (ref >60)
Glucose, Bld: 89 mg/dL (ref 70–99)
Potassium: 3.8 mmol/L (ref 3.5–5.1)
Sodium: 138 mmol/L (ref 135–145)

## 2023-09-16 LAB — CBC
HCT: 35.2 % — ABNORMAL LOW (ref 39.0–52.0)
Hemoglobin: 11.4 g/dL — ABNORMAL LOW (ref 13.0–17.0)
MCH: 31.6 pg (ref 26.0–34.0)
MCHC: 32.4 g/dL (ref 30.0–36.0)
MCV: 97.5 fL (ref 80.0–100.0)
Platelets: 218 10*3/uL (ref 150–400)
RBC: 3.61 MIL/uL — ABNORMAL LOW (ref 4.22–5.81)
RDW: 12.4 % (ref 11.5–15.5)
WBC: 5.6 10*3/uL (ref 4.0–10.5)
nRBC: 0 % (ref 0.0–0.2)

## 2023-09-16 NOTE — Plan of Care (Signed)
  Problem: Clinical Measurements: Goal: Ability to maintain clinical measurements within normal limits will improve Outcome: Progressing Goal: Respiratory complications will improve Outcome: Progressing Goal: Cardiovascular complication will be avoided Outcome: Progressing   Problem: Activity: Goal: Risk for activity intolerance will decrease Outcome: Progressing   

## 2023-09-16 NOTE — Progress Notes (Signed)
  Progress Note   [name] WUJ:[id number] DOB: 03-16-1946 DOA: 09/15/2023     1 DOS: the patient was seen and examined on 09/16/2023   Brief hospital course:   Mr. Terrick Allred is a 78 year old male with history of GERD, hyperlipidemia, MGUS, myelopathy, BPH, iron deficiency anemia, presents emergency department for chief concerns of right leg swelling.   Patient was sent from [facility] for concerns of DVT.  Bilateral LE venous U/S negative for DVT's.  See H&P for full HPI on admission & ED course.    Patient was admitted and started on IV antibiotics for right lower extremity cellulitis as outlined in detail below.   Assessment and Plan:  * Cellulitis of right leg - images on admission in chart media for review Failed outpatient therapy --Follow blood cultures  --Continue Rocephin  --Monitor fever curve, CBC   Pulmonary nodules Bilateral subsolid pulmonary nodule Continue outpatient follow-up with oncologist as appropriate   Non-traumatic rhabdomyolysis CK normal 356 on admission   Hypoglycemia Hypoglycemia protocol, D50   MGUS (monoclonal gammopathy of unknown significance) Continue outpatient follow-up with hematologist as appropriate       Subjective: Pt awake resting in bed this AM on rounds. He reports feeling okay overall. Denies fever/chills.  Some right leg discomfort.    Physical Exam: Vitals:   09/15/23 1953 09/16/23 0451 09/16/23 0758 09/16/23 1546  BP: 113/60 132/70 (!) 147/73 128/72  Pulse: 85 (!) 56 63 73  Resp: 18 16 18 16   Temp: 97.9 F (36.6 C) (!) 97.3 F (36.3 C)  98.5 F (36.9 C)  TempSrc:      SpO2: 99% 100% 100% 100%  Weight:      Height:       General exam: awake, alert, no acute distress HEENT: atraumatic, clear conjunctiva, anicteric sclera, moist mucus membranes, hearing grossly normal  Respiratory system: CTAB, no wheezes, rales or rhonchi, normal respiratory effort. Cardiovascular system: normal S1/S2,  RRR Gastrointestinal system: soft, NT, ND Central nervous system: A&O x 3. no gross focal neurologic deficits, normal speech Extremities: RLE edema and erythema mild differential warmth, BLE venous stasis Psychiatry: normal mood, congruent affect, judgement and insight appear normal   Data Reviewed:  Notable labs --  Cr 0.59 Ca 8.7 Hbg 11.4  Family Communication: None. Pt updated in detail.  Disposition: Status is: Inpatient Remains inpatient appropriate because: remains on IV antibiotics pending further clinical improvement   Planned Discharge Destination: Home    Time spent: 42 minutes  Author: Montey Apa, DO 09/16/2023 7:23 PM  For on call review [email or web address].

## 2023-09-16 NOTE — Plan of Care (Signed)

## 2023-09-16 NOTE — Plan of Care (Signed)
  Problem: Education: Goal: Knowledge of General Education information will improve Description: Including pain rating scale, medication(s)/side effects and non-pharmacologic comfort measures 09/16/2023 1643 by Devoria Font, RN Outcome: Progressing 09/16/2023 1152 by Devoria Font, RN Outcome: Progressing   Problem: Health Behavior/Discharge Planning: Goal: Ability to manage health-related needs will improve 09/16/2023 1643 by Devoria Font, RN Outcome: Progressing 09/16/2023 1152 by Devoria Font, RN Outcome: Progressing   Problem: Clinical Measurements: Goal: Ability to maintain clinical measurements within normal limits will improve 09/16/2023 1643 by Devoria Font, RN Outcome: Progressing 09/16/2023 1152 by Devoria Font, RN Outcome: Progressing Goal: Will remain free from infection 09/16/2023 1643 by Devoria Font, RN Outcome: Progressing 09/16/2023 1152 by Devoria Font, RN Outcome: Progressing Goal: Diagnostic test results will improve 09/16/2023 1643 by Devoria Font, RN Outcome: Progressing 09/16/2023 1152 by Devoria Font, RN Outcome: Progressing Goal: Respiratory complications will improve 09/16/2023 1643 by Devoria Font, RN Outcome: Progressing 09/16/2023 1152 by Devoria Font, RN Outcome: Progressing Goal: Cardiovascular complication will be avoided 09/16/2023 1643 by Devoria Font, RN Outcome: Progressing 09/16/2023 1152 by Devoria Font, RN Outcome: Progressing   Problem: Activity: Goal: Risk for activity intolerance will decrease 09/16/2023 1643 by Devoria Font, RN Outcome: Progressing 09/16/2023 1152 by Devoria Font, RN Outcome: Progressing   Problem: Nutrition: Goal: Adequate nutrition will be maintained 09/16/2023 1643 by Devoria Font, RN Outcome: Progressing 09/16/2023 1152 by Devoria Font, RN Outcome: Progressing   Problem: Coping: Goal: Level of anxiety will decrease 09/16/2023 1643 by Devoria Font, RN Outcome:  Progressing 09/16/2023 1152 by Devoria Font, RN Outcome: Progressing   Problem: Elimination: Goal: Will not experience complications related to bowel motility 09/16/2023 1643 by Devoria Font, RN Outcome: Progressing 09/16/2023 1152 by Devoria Font, RN Outcome: Progressing Goal: Will not experience complications related to urinary retention 09/16/2023 1643 by Devoria Font, RN Outcome: Progressing 09/16/2023 1152 by Devoria Font, RN Outcome: Progressing   Problem: Pain Managment: Goal: General experience of comfort will improve and/or be controlled 09/16/2023 1643 by Devoria Font, RN Outcome: Progressing 09/16/2023 1152 by Devoria Font, RN Outcome: Progressing   Problem: Safety: Goal: Ability to remain free from injury will improve 09/16/2023 1643 by Devoria Font, RN Outcome: Progressing 09/16/2023 1152 by Devoria Font, RN Outcome: Progressing   Problem: Skin Integrity: Goal: Risk for impaired skin integrity will decrease 09/16/2023 1643 by Devoria Font, RN Outcome: Progressing 09/16/2023 1152 by Devoria Font, RN Outcome: Progressing   Problem: Clinical Measurements: Goal: Ability to avoid or minimize complications of infection will improve 09/16/2023 1643 by Devoria Font, RN Outcome: Progressing 09/16/2023 1152 by Devoria Font, RN Outcome: Progressing   Problem: Skin Integrity: Goal: Skin integrity will improve 09/16/2023 1643 by Devoria Font, RN Outcome: Progressing 09/16/2023 1152 by Devoria Font, RN Outcome: Progressing

## 2023-09-17 DIAGNOSIS — L03115 Cellulitis of right lower limb: Secondary | ICD-10-CM | POA: Diagnosis not present

## 2023-09-17 NOTE — Plan of Care (Signed)

## 2023-09-17 NOTE — Progress Notes (Signed)
  Progress Note   [name] [id number]:[id number] DOB: 1945/05/19 DOA: 09/15/2023     2 DOS: the patient was seen and examined on 09/17/2023   Brief hospital course:   Frank Buckley is a 78 year old male with history of GERD, hyperlipidemia, MGUS, myelopathy, BPH, iron deficiency anemia, presents emergency department for chief concerns of right leg swelling.   Patient was sent from [facility] for concerns of DVT.  Bilateral LE venous U/S negative for DVT's.  See H&P for full HPI on admission & ED course.    Patient was admitted and started on IV antibiotics for right lower extremity cellulitis as outlined in detail below.   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 He reports left shoulder pain that feels like a toothache, has a knot on the shoulder that's been there quite a while, but it's been hurting more recently.  No other issues or complaints.   Physical Exam: Vitals:   09/16/23 2013 09/17/23 0307 09/17/23 0733 09/17/23 1527  BP: 107/66 127/73 118/82 (!) 98/56  Pulse: 86 64 63 83  Resp: 16 16 16 17   Temp: 98.2 F (36.8 C) 98 F (36.7 C) 98 F (36.7 C) 98.1 F (36.7 C)  TempSrc: Oral Oral    SpO2: 98% 99% 99% 100%  Weight:      Height:       General exam: awake, alert, no acute distress HEENT: moist mucus  membranes, hearing grossly normal  Respiratory system: CTAB, no wheezes, rales or rhonchi, normal respiratory effort. Cardiovascular system: normal S1/S2, RRR Gastrointestinal system: soft, NT, ND Central nervous system: A&O x 3. no gross focal neurologic deficits, normal speech Extremities: RLE edema and erythema mild differential warmth, BLE venous stasis, left superior aspect of shoulder with approx 3-4 cm mobile mass consistent with lipoma mildly tender on palpation without differential warmth of the area Psychiatry: normal mood, congruent affect, judgement and insight appear normal   Data Reviewed:  Notable labs -- No new labs today  From 6/14 --  Cr 0.59 Ca 8.7 Hbg 11.4  Family Communication: None. Pt updated in detail.  Disposition: Status is: Inpatient Remains inpatient appropriate because: remains on IV antibiotics pending further clinical improvement   Planned Discharge Destination: Home    Time spent: 42 minutes  Author: Montey Apa, DO 09/17/2023 4:06 PM  For on call review [email or web address].

## 2023-09-17 NOTE — Evaluation (Signed)
 Physical Therapy Evaluation Patient Details Name: Frank Buckley MRN: [id number] DOB: [birth year] Today's Date: 09/17/2023  History of Present Illness  presented to ER secondary to R LE swelling, multiple falls; admitted for management of R LE cellulitis.  Clinical Impression  Patient resting in bed upon arrival to session; alert and oriented to basic information, follows commands and agreeable to participation with session. Eager for OOB efforts.  Endorses chronic pain to L shoulder (FACES 4/10); unchanged with this admission.  Did note cyst-like structure near L biceps tendon; encouraged patient to discuss with MD. Patient generally weak and deconditioned, but does feel that R LE strength/edema has improved since admission.  Currently able to complete bed mobility with close sup; sit/stand, basic transfers and gait (200') with RW, cga/min assist.  Demonstrates narrowed (near tandem) stepping pattern, generally demonstrating step to gait pattern (leading with L LE). Poor foot clearance (R > L) with exaggerated truncal movement, posterior pelvic tilt to initiate advancement/swing of R LE. R LE genu recurvatum/varus noted in loading phases of gait, resulting in increased stance time/weight shift to R LE thorughout gait cycle. Min cuing for postural extension and walker position throughout distance  Of note, did demonstrate significant drop in BP with transition from supine to sit during orthostatic assessment (see vitals flowsheet for details); but does recover with transition to standing and progressive mobility efforts.  Denies symptoms throughout session, but will continue to monitor in subsequent sessions. RN informed/aware. Would benefit from skilled PT to address above deficits and promote optimal return to PLOF.; recommend post-acute PT follow up as indicated by interdisciplinary care team.            If plan is discharge home, recommend the following: A little help with walking and/or  transfers;A little help with bathing/dressing/bathroom   Can travel by private vehicle        Equipment Recommendations Rolling walker (2 wheels)  Recommendations for Other Services       Functional Status Assessment Patient has had a recent decline in their functional status and demonstrates the ability to make significant improvements in function in a reasonable and predictable amount of time.     Precautions / Restrictions Precautions Precautions: Fall Restrictions Weight Bearing Restrictions Per Provider Order: No      Mobility  Bed Mobility Overal bed mobility: Needs Assistance Bed Mobility: Supine to Sit     Supine to sit: Supervision     General bed mobility comments: increased time/effort, but does complete without physical assist from therapist    Transfers Overall transfer level: Needs assistance Equipment used: Rolling walker (2 wheels) Transfers: Sit to/from Stand, Bed to chair/wheelchair/BSC Sit to Stand: Min assist Stand pivot transfers: Min assist              Ambulation/Gait Ambulation/Gait assistance: Contact guard assist, Min assist Gait Distance (Feet): 200 Feet Assistive device: Rolling walker (2 wheels)         General Gait Details: narrowed (near tandem) stepping pattern, generally demonstrating step to gait pattern (leading with L LE).  Poor foot clearance (R > L) with exaggerated truncal movement, posterior pelvic tilt to initiate advancement/swing of R LE.  R LE genu recurvatum/varus noted in loading phases of gait, resulting in increased stance time/weight shift to R LE thorughout gait cycle.  Min cuing for postural extension and walker position throughout distance  Stairs            Wheelchair Mobility     Tilt Bed  Modified Rankin (Stroke Patients Only)       Balance Overall balance assessment: Needs assistance Sitting-balance support: No upper extremity supported, Feet supported Sitting balance-Leahy Scale:  Good     Standing balance support: Bilateral upper extremity supported Standing balance-Leahy Scale: Fair                               Pertinent Vitals/Pain Pain Assessment Pain Assessment: Faces Faces Pain Scale: Hurts little more Pain Location: L shoulder Pain Descriptors / Indicators: Aching, Sore Pain Intervention(s): Limited activity within patient's tolerance, Monitored during session, Repositioned    Home Living Family/patient expects to be discharged to:: Private residence Living Arrangements: Alone Available Help at Discharge: Family;Available PRN/intermittently Type of Home: House Home Access: Stairs to enter Entrance Stairs-Rails: Right Entrance Stairs-Number of Steps: 2   Home Layout: One level Home Equipment: Standard Walker      Prior Function Prior Level of Function : Independent/Modified Independent;Driving             Mobility Comments: Mod indep with SW/RW (?) for household and limited community distances; does endorse multiple fall history (as well as ability to self-recover as needed)       Extremity/Trunk Assessment   Upper Extremity Assessment Upper Extremity Assessment: Generalized weakness (grossly at least 4/5 throughout; chronic atrophy to bilat hands)    Lower Extremity Assessment Lower Extremity Assessment: Generalized weakness (grossly 4-/5 throughout bilat hips and kness, 3-/5 bilat ankle DF)       Communication   Communication Communication: No apparent difficulties    Cognition Arousal: Alert Behavior During Therapy: WFL for tasks assessed/performed   PT - Cognitive impairments: No apparent impairments                       PT - Cognition Comments: Alert and oriented to basic information; follows simple commands; pleasant and cooperative Following commands: Intact       Cueing       General Comments      Exercises     Assessment/Plan    PT Assessment Patient needs continued PT services   PT Problem List Decreased strength;Decreased activity tolerance;Decreased balance;Decreased mobility;Decreased coordination;Decreased cognition;Decreased knowledge of use of DME;Decreased safety awareness;Decreased knowledge of precautions       PT Treatment Interventions DME instruction;Gait training;Stair training;Functional mobility training;Therapeutic activities;Balance training;Therapeutic exercise;Patient/family education    PT Goals (Current goals can be found in the Care Plan section)  Acute Rehab PT Goals Patient Stated Goal: to return home PT Goal Formulation: With patient Time For Goal Achievement: 10/01/23 Potential to Achieve Goals: Good    Frequency Min 2X/week     Co-evaluation               AM-PAC PT 6 Clicks Mobility  Outcome Measure Help needed turning from your back to your side while in a flat bed without using bedrails?: None Help needed moving from lying on your back to sitting on the side of a flat bed without using bedrails?: None Help needed moving to and from a bed to a chair (including a wheelchair)?: A Little Help needed standing up from a chair using your arms (e.g., wheelchair or bedside chair)?: A Little Help needed to walk in hospital room?: A Little Help needed climbing 3-5 steps with a railing? : A Little 6 Click Score: 20    End of Session Equipment Utilized During Treatment: Gait belt Activity Tolerance: Patient  tolerated treatment well Patient left: in chair;with call bell/phone within reach;with chair alarm set Nurse Communication: Mobility status PT Visit Diagnosis: Muscle weakness (generalized) (M62.81);Difficulty in walking, not elsewhere classified (R26.2)    Time: 0981-1914 PT Time Calculation (min) (ACUTE ONLY): 29 min   Charges:   PT Evaluation $PT Eval Moderate Complexity: 1 Mod PT Treatments $Therapeutic Activity: 8-22 mins PT General Charges $$ ACUTE PT VISIT: 1 Visit       Dain Laseter H. Bevin Bucks, PT, DPT,  NCS 09/17/23, 2:19 PM [phone]

## 2023-09-18 ENCOUNTER — Inpatient Hospital Stay

## 2023-09-18 DIAGNOSIS — L03115 Cellulitis of right lower limb: Secondary | ICD-10-CM | POA: Diagnosis not present

## 2023-09-18 MED ORDER — LIDOCAINE 4 % EX CREA
TOPICAL_CREAM | Freq: Three times a day (TID) | CUTANEOUS | Status: DC | PRN
  Filled 2023-09-18: qty 5

## 2023-09-18 MED ORDER — LIDOCAINE 4 % EX CREA: TOPICAL_CREAM | Freq: Three times a day (TID) | CUTANEOUS | 0 refills | Status: AC | PRN

## 2023-09-18 MED ORDER — METHOCARBAMOL 500 MG PO TABS: 500.0000 mg | ORAL_TABLET | Freq: Four times a day (QID) | ORAL | Status: AC | PRN

## 2023-09-18 MED ORDER — CEFDINIR 300 MG PO CAPS: 300.0000 mg | ORAL_CAPSULE | Freq: Two times a day (BID) | ORAL | 0 refills | Status: AC

## 2023-09-18 MED ORDER — POLYETHYLENE GLYCOL 3350 17 G PO PACK: 17.0000 g | PACK | Freq: Two times a day (BID) | ORAL | Status: AC

## 2023-09-18 NOTE — Progress Notes (Signed)
 Patient stated he drove himself to the hospital. Nurse spoke with patient's brother to see if ride could be arranged. Patient refused to wait for ride and insisted he could drive himself. MD notified of patient stating he drove himself to hospital and wanted to drive himself home. MD stated patient was capable of driving himself. No narcotics were administered today.

## 2023-09-18 NOTE — TOC Transition Note (Signed)
 Transition of Care [facility]) - Discharge Note   Patient Details  Name: Frank Buckley MRN: [id number] Date of Birth: 1946-03-12  Transition of Care [facility]) CM/SW Contact:  Odilia Bennett, LCSW Phone Number: 09/18/2023, 12:12 PM   Clinical Narrative:   Patient has orders to discharge home today. CSW met with patient. No family at bedside. CSW introduced role and explained that PT recommendations would be discussed. Patient is agreeable to home health. No agency preference. [facility] has accepted for PT. Home RW is in the room. No further concerns. Patient will drive himself home. CSW signing off.  Final next level of care: Home/Self Care Barriers to Discharge: No Barriers Identified   Patient Goals and CMS Choice            Discharge Placement                Patient to be transferred to facility by: Self   Patient and family notified of of transfer: 09/18/23  Discharge Plan and Services Additional resources added to the After Visit Summary for                            [facility] Arranged: PT [facility] Agency: [facility] Date [facility] Agency Contacted: 09/18/23   Representative spoke with at [facility] Agency: Randel Buss  Social Drivers of Health (SDOH) Interventions SDOH Screenings   Food Insecurity: Food Insecurity Present (09/15/2023)  Housing: Low Risk  (09/15/2023)  Transportation Needs: No Transportation Needs (09/15/2023)  Utilities: At Risk (09/15/2023)  Social Connections: Unknown (09/15/2023)  Tobacco Use: Medium Risk (09/15/2023)     Readmission Risk Interventions     No data to display

## 2023-09-18 NOTE — Discharge Summary (Signed)
 Physician Discharge Summary   [name] MRN: [id number] DOB: 1946-03-05  Admit date:     09/15/2023  Discharge date: 09/18/23  Discharge Physician: Montey Apa   PCP: Macie Saxon, MD   Recommendations at discharge:    Follow up with Primary Care in 1-2 weeks Follow up on resolution of right leg cellulitis after completion of antibiotics Repeat CBC, CMP in 1-2 weeks Patient has a lipoma on left superior shoulder. Consider referral to general surgery for excision if pain and discomfort continues.  Discharge Diagnoses: Principal Problem:   Cellulitis of right leg Active Problems:   MGUS (monoclonal gammopathy of unknown significance)   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   IDA (iron deficiency anemia)   Hyperlipidemia   GERD (gastroesophageal reflux disease)   BPH (benign prostatic hyperplasia)   Hypoglycemia   Non-traumatic rhabdomyolysis   Myelopathy (HCC)   Pulmonary nodules  Resolved Problems:   * No resolved hospital problems. [facility] Course:   Frank Buckley is a 78 year old male with history of GERD, hyperlipidemia, MGUS, myelopathy, BPH, iron deficiency anemia, presents emergency department for chief concerns of right leg swelling.   Patient was sent from [facility] for concerns of DVT.  Bilateral LE venous U/S negative for DVT's.  See H&P for full HPI on admission & ED course.     Patient was admitted and started on IV antibiotics for right lower extremity cellulitis as outlined in detail below.  6/16 - pt doing well.  Right leg has significantly improved.  Patient is medically stable for discharge to day and is agreeable with the plan.  He is agreeable to [facility] PT.  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 --Consider outpatient surgery referral for excision if persistent pain   Pulmonary nodules Bilateral subsolid pulmonary nodule Continue outpatient follow-up with oncologist as appropriate   Non-traumatic rhabdomyolysis CK normal 356 on admission   Hypoglycemia Hypoglycemia protocol, D50   MGUS (monoclonal gammopathy of unknown significance) Continue outpatient follow-up with hematologist as appropriate       Consultants: none Procedures performed: none  Disposition: Home health Diet recommendation:  Regular diet DISCHARGE MEDICATION: Allergies as of 09/18/2023       Reactions   Penicillins Rash        Medication List     STOP taking these medications    doxycycline 100 MG capsule Commonly known as: VIBRAMYCIN       TAKE these medications    acetaminophen  650 MG CR tablet Commonly known as: TYLENOL  Take 1,300 mg by mouth every 12 (twelve) hours as needed for pain or fever.   aspirin  EC 81 MG tablet Take 81 mg by mouth daily.   cefdinir 300 MG capsule Commonly known as: OMNICEF Take 1 capsule (300 mg total) by mouth 2 (two) times daily for 3 days.   docusate sodium  100 MG capsule Commonly known as: COLACE Take 100 mg by mouth daily.   doxazosin  2 MG tablet Commonly known as: CARDURA  Take 2 mg by mouth daily.   ergocalciferol  1.25 MG (50000 UT) capsule Commonly known as: VITAMIN D2 Take 1 capsule (50,000 Units total) by mouth once a week.   finasteride  5 MG tablet  Commonly known as: PROSCAR  Take 1 tablet (5 mg total) by mouth daily.   lidocaine  4 % cream Commonly known as: LMX Apply topically 3 (three) times daily as needed (left shoulder pain).   melatonin 3 MG Tabs tablet Take 6 mg by mouth at bedtime.   meloxicam 15 MG tablet Commonly known as: MOBIC Take 15 mg by mouth daily.   methocarbamol  500 MG tablet Commonly known as: ROBAXIN  Take 1 tablet (500 mg total) by mouth every 6  (six) hours as needed for muscle spasms.   omeprazole 20 MG capsule Commonly known as: PRILOSEC Take 20 mg by mouth daily.   polyethylene glycol 17 g packet Commonly known as: MIRALAX  / GLYCOLAX  Take 17 g by mouth 2 (two) times daily.   potassium chloride  10 MEQ tablet Commonly known as: KLOR-CON  M Take 10 mEq by mouth daily.   pravastatin  40 MG tablet Commonly known as: PRAVACHOL  Take 40 mg by mouth daily.   torsemide 20 MG tablet Commonly known as: DEMADEX Take 40 mg by mouth daily.        Follow-up Information     [facility], [facility] Follow up.   Specialty: Home Health Services Why: They will follow up with you for your home health PT needs. Contact information: [address] [place] [address] [phone]                Discharge Exam: Filed Weights   09/15/23 1238  Weight: 67.1 kg   General exam: awake, alert, no acute distress HEENT: moist mucus membranes, hearing grossly normal  Respiratory system: on room air, ormal respiratory effort. Cardiovascular system: normal S1/S2, RRR, no pedal edema.   Gastrointestinal system: soft, NT, ND Central nervous system: A&O x3. no gross focal neurologic deficits, normal speech Extremities: right leg swelling and erythema have nearly resolved Psychiatry: normal mood, congruent affect, judgement and insight appear normal   Condition at discharge: stable  The results of significant diagnostics from this hospitalization (including imaging, microbiology, ancillary and laboratory) are listed below for reference.   Imaging Studies: US  LT UPPER EXTREM LTD SOFT TISSUE NON VASCULAR Result Date: 09/18/2023 CLINICAL DATA:  Shoulder mass. EXAM: ULTRASOUND LEFT UPPER EXTREMITY LIMITED TECHNIQUE: Ultrasound examination of the upper extremity soft tissues was performed in the area of clinical concern. COMPARISON:  None Available. FINDINGS: Sonographic evaluation of the anterior left shoulder adjacent to  the lateral aspect of the clavicle, corresponding to the area of clinical concern, demonstrates a 3.6 x 3.3 x 1.0 cm circumscribed lesion isoechoic to fat without evidence of internal vascularity. IMPRESSION: Sonographic evaluation of the anterior left shoulder, corresponding to the area of clinical concern, demonstrates a 3.6 x 3.3 x 1.0 cm circumscribed lesion isoechoic to fat without evidence of internal vascularity. 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 Consider further evaluation with MRI as clinically warranted. Electronically Signed   By: Mannie Seek M.D.   On: 09/18/2023 11:30   US  Venous Img Lower Bilateral (DVT) Result Date: 09/15/2023 CLINICAL DATA:  Bilateral lower extremity edema EXAM: BILATERAL LOWER EXTREMITY VENOUS DOPPLER ULTRASOUND TECHNIQUE: Gray-scale sonography with graded compression, as well as color Doppler and duplex ultrasound were performed to evaluate the lower extremity deep venous systems from the level of the common femoral vein and including the common femoral, femoral, profunda femoral, popliteal and calf veins including the posterior tibial, peroneal and gastrocnemius veins when visible. The superficial great saphenous vein was also interrogated. Spectral  Doppler was utilized to evaluate flow at rest and with distal augmentation maneuvers in the common femoral, femoral and popliteal veins. COMPARISON:  None Available. FINDINGS: RIGHT LOWER EXTREMITY Common Femoral Vein: No evidence of thrombus. Normal compressibility, respiratory phasicity and response to augmentation. Saphenofemoral Junction: No evidence of thrombus. Normal compressibility and flow on color Doppler imaging. Profunda Femoral Vein: No evidence of thrombus. Normal compressibility and flow on color Doppler imaging. Femoral Vein: No evidence of thrombus. Normal compressibility, respiratory phasicity and response to augmentation. Popliteal Vein: No  evidence of thrombus. Normal compressibility, respiratory phasicity and response to augmentation. Calf Veins: No evidence of thrombus. Normal compressibility and flow on color Doppler imaging. Superficial Great Saphenous Vein: No evidence of thrombus. Normal compressibility. Venous Reflux:  None. Other Findings:  None. LEFT LOWER EXTREMITY Common Femoral Vein: No evidence of thrombus. Normal compressibility, respiratory phasicity and response to augmentation. Saphenofemoral Junction: No evidence of thrombus. Normal compressibility and flow on color Doppler imaging. Profunda Femoral Vein: No evidence of thrombus. Normal compressibility and flow on color Doppler imaging. Femoral Vein: No evidence of thrombus. Normal compressibility, respiratory phasicity and response to augmentation. Popliteal Vein: No evidence of thrombus. Normal compressibility, respiratory phasicity and response to augmentation. Calf Veins: No evidence of thrombus. Normal compressibility and flow on color Doppler imaging. Superficial Great Saphenous Vein: No evidence of thrombus. Normal compressibility. Venous Reflux:  None. Other Findings:  None. IMPRESSION: No evidence of deep venous thrombosis in either lower extremity. Electronically Signed   By: Fernando Hoyer M.D.   On: 09/15/2023 16:29    Microbiology: Results for orders placed or performed during the hospital encounter of 09/15/23  Culture, blood (routine x 2)     Status: None (Preliminary result)   Collection Time: 09/15/23  3:22 PM   Specimen: BLOOD  Result Value Ref Range Status   Specimen Description BLOOD RIGHT ANTECUBITAL  Final   Special Requests   Final    BOTTLES DRAWN AEROBIC AND ANAEROBIC Blood Culture results may not be optimal due to an inadequate volume of blood received in culture bottles   Culture   Final    NO GROWTH 3 DAYS Performed at [facility], [address], [place], [place] [address]    Report Status PENDING  Incomplete  Culture, blood  (routine x 2)     Status: None (Preliminary result)   Collection Time: 09/15/23  3:23 PM   Specimen: BLOOD  Result Value Ref Range Status   Specimen Description BLOOD RIGHT ANTECUBITAL  Final   Special Requests   Final    BOTTLES DRAWN AEROBIC AND ANAEROBIC Blood Culture results may not be optimal due to an inadequate volume of blood received in culture bottles   Culture   Final    NO GROWTH 3 DAYS Performed at [facility], [address] [place], [place] [address]    Report Status PENDING  Incomplete    Labs: CBC: Recent Labs  Lab 09/15/23 1245 09/16/23 0202  WBC 5.8 5.6  NEUTROABS 3.5  --   HGB 10.8* 11.4*  HCT 32.1* 35.2*  MCV 95.8 97.5  PLT 229 218   Basic Metabolic Panel: Recent Labs  Lab 09/15/23 1245 09/16/23 0202  NA 135 138  K 3.5 3.8  CL 96* 102  CO2 30 29  GLUCOSE 87 89  BUN 14 12  CREATININE 0.76 0.59*  CALCIUM 8.8* 8.7*   Liver Function Tests: No results for input(s): AST, ALT, ALKPHOS, BILITOT, PROT, ALBUMIN in the last 168  hours. CBG: No results for input(s): GLUCAP in the last 168 hours.  Discharge time spent: less than 30 minutes.  Signed: Montey Apa, DO [facility] 09/18/2023

## 2023-09-20 ENCOUNTER — Telehealth: Payer: Self-pay

## 2023-09-20 LAB — CULTURE, BLOOD (ROUTINE X 2)
Culture: NO GROWTH
Culture: NO GROWTH

## 2023-09-20 NOTE — Telephone Encounter (Signed)
 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 Client left message that physical therapy was ordered however he has not yet been contacted for an assessment. Called client's phone twice and left messages for him to return phone call. Number provided. Gery Kubas RN

## 2023-09-22 ENCOUNTER — Other Ambulatory Visit: Payer: Self-pay | Admitting: *Deleted

## 2023-09-22 DIAGNOSIS — R918 Other nonspecific abnormal finding of lung field: Secondary | ICD-10-CM

## 2023-09-29 ENCOUNTER — Inpatient Hospital Stay

## 2023-09-29 ENCOUNTER — Ambulatory Visit

## 2023-10-09 ENCOUNTER — Ambulatory Visit
Admission: RE | Admit: 2023-10-09 | Discharge: 2023-10-09 | Disposition: A | Discharge status: Home / Self Care | Source: Ambulatory Visit | Attending: Oncology | Admitting: Oncology

## 2023-10-09 ENCOUNTER — Inpatient Hospital Stay: Attending: Oncology

## 2023-10-09 DIAGNOSIS — Z87891 Personal history of nicotine dependence: Secondary | ICD-10-CM | POA: Insufficient documentation

## 2023-10-09 DIAGNOSIS — D472 Monoclonal gammopathy: Secondary | ICD-10-CM | POA: Diagnosis present

## 2023-10-09 DIAGNOSIS — D1722 Benign lipomatous neoplasm of skin and subcutaneous tissue of left arm: Secondary | ICD-10-CM | POA: Insufficient documentation

## 2023-10-09 DIAGNOSIS — R918 Other nonspecific abnormal finding of lung field: Secondary | ICD-10-CM | POA: Diagnosis present

## 2023-10-09 DIAGNOSIS — R5383 Other fatigue: Secondary | ICD-10-CM | POA: Insufficient documentation

## 2023-10-09 LAB — CBC WITH DIFFERENTIAL/PLATELET
Abs Immature Granulocytes: 0.01 K/uL (ref 0.00–0.07)
Basophils Absolute: 0.1 K/uL (ref 0.0–0.1)
Basophils Relative: 1 %
Eosinophils Absolute: 0.4 K/uL (ref 0.0–0.5)
Eosinophils Relative: 9 %
HCT: 30.2 % — ABNORMAL LOW (ref 39.0–52.0)
Hemoglobin: 9.9 g/dL — ABNORMAL LOW (ref 13.0–17.0)
Immature Granulocytes: 0 %
Lymphocytes Relative: 25 %
Lymphs Abs: 1.2 K/uL (ref 0.7–4.0)
MCH: 31.9 pg (ref 26.0–34.0)
MCHC: 32.8 g/dL (ref 30.0–36.0)
MCV: 97.4 fL (ref 80.0–100.0)
Monocytes Absolute: 0.4 K/uL (ref 0.1–1.0)
Monocytes Relative: 8 %
Neutro Abs: 2.7 K/uL (ref 1.7–7.7)
Neutrophils Relative %: 57 %
Platelets: 193 K/uL (ref 150–400)
RBC: 3.1 MIL/uL — ABNORMAL LOW (ref 4.22–5.81)
RDW: 12.4 % (ref 11.5–15.5)
WBC: 4.8 K/uL (ref 4.0–10.5)
nRBC: 0 % (ref 0.0–0.2)

## 2023-10-09 LAB — BASIC METABOLIC PANEL - CANCER CENTER ONLY
Anion gap: 6 (ref 5–15)
BUN: 23 mg/dL (ref 8–23)
CO2: 30 mmol/L (ref 22–32)
Calcium: 9 mg/dL (ref 8.9–10.3)
Chloride: 101 mmol/L (ref 98–111)
Creatinine: 1.13 mg/dL (ref 0.61–1.24)
GFR, Estimated: >60 mL/min (ref >60)
Glucose, Bld: 92 mg/dL (ref 70–99)
Potassium: 4.1 mmol/L (ref 3.5–5.1)
Sodium: 137 mmol/L (ref 135–145)

## 2023-10-09 MED ORDER — IOHEXOL 300 MG/ML  SOLN
75.0000 mL | Freq: Once | INTRAMUSCULAR | Status: AC | PRN
  Administered 2023-10-09: 75 mL via INTRAVENOUS

## 2023-10-10 LAB — KAPPA/LAMBDA LIGHT CHAINS
Kappa free light chain: 28.9 mg/L — ABNORMAL HIGH (ref 3.3–19.4)
Kappa, lambda light chain ratio: 1.48 (ref 0.26–1.65)
Lambda free light chains: 19.5 mg/L (ref 5.7–26.3)

## 2023-10-11 LAB — PROTEIN ELECTROPHORESIS, SERUM
A/G Ratio: 1.2 (ref 0.7–1.7)
Albumin ELP: 3.7 g/dL (ref 2.9–4.4)
Alpha-1-Globulin: 0.1 g/dL (ref 0.0–0.4)
Alpha-2-Globulin: 0.5 g/dL (ref 0.4–1.0)
Beta Globulin: 0.8 g/dL (ref 0.7–1.3)
Gamma Globulin: 1.6 g/dL (ref 0.4–1.8)
Globulin, Total: 3 g/dL (ref 2.2–3.9)
M-Spike, %: 0.5 g/dL — ABNORMAL HIGH
Total Protein ELP: 6.7 g/dL (ref 6.0–8.5)

## 2023-10-13 ENCOUNTER — Inpatient Hospital Stay (HOSPITAL_BASED_OUTPATIENT_CLINIC_OR_DEPARTMENT_OTHER): Admitting: Oncology

## 2023-10-13 ENCOUNTER — Encounter: Payer: Self-pay | Admitting: Oncology

## 2023-10-13 VITALS — BP 103/66 | HR 70 | Temp 97.3°F | Resp 16 | Ht 69.0 in | Wt 141.0 lb

## 2023-10-13 DIAGNOSIS — D472 Monoclonal gammopathy: Secondary | ICD-10-CM | POA: Diagnosis not present

## 2023-10-13 DIAGNOSIS — R918 Other nonspecific abnormal finding of lung field: Secondary | ICD-10-CM

## 2023-10-13 NOTE — Progress Notes (Signed)
 Patient has a cyst on his shoulder that is starting to bother him, he does have shoulder surgery next week. He is also having some swelling in his legs and ankles.

## 2023-10-13 NOTE — Progress Notes (Signed)
 [facility]  [phone]) [phone] Fax:(336) [phone]  ID: Frank Buckley OB: 12-24-1945  MR#: [id number]  RDW#:[id number]  Patient Care Team: Buren Rock HERO, MD as PCP - General (Family Medicine) Jacobo Evalene PARAS, MD as Consulting Physician (Oncology)  CHIEF COMPLAINT: MGUS, pulmonary nodules.  INTERVAL HISTORY: Patient returns to clinic today for further evaluation and discussion of his imaging and laboratory results.  His performance status has declined somewhat, patient otherwise feels well.  He has left shoulder pain and is having surgery to remove a cyst in the next several weeks.  He has no neurologic complaints. He denies any recent fevers or illnesses.  He denies any chest pain, shortness of breath, cough, or hemoptysis.  He denies any nausea, vomiting, constipation, or diarrhea.  He has no urinary complaints.  Patient offers no further specific complaints today.  REVIEW OF SYSTEMS:   Review of Systems  Constitutional:  Positive for malaise/fatigue. Negative for fever and weight loss.  Respiratory: Negative.  Negative for cough, hemoptysis and shortness of breath.   Cardiovascular: Negative.  Negative for chest pain and leg swelling.  Gastrointestinal: Negative.  Negative for abdominal pain.  Genitourinary: Negative.  Negative for hematuria.  Musculoskeletal:  Positive for joint pain. Negative for back pain.  Skin: Negative.  Negative for rash.  Neurological: Negative.  Negative for sensory change, focal weakness, weakness and headaches.  Psychiatric/Behavioral: Negative.  The patient is not nervous/anxious.     As per HPI. Otherwise, a complete review of systems is negative.  PAST MEDICAL HISTORY: Past Medical History:  Diagnosis Date   Acute metabolic encephalopathy 07/10/2022   Borderline diabetic    Cancer (HCC)    Elevated liver enzymes 07/10/2022   GERD (gastroesophageal reflux disease)     PAST SURGICAL HISTORY: Past Surgical History:   Procedure Laterality Date   APPLICATION OF INTRAOPERATIVE CT SCAN N/A 08/03/2022   Procedure: APPLICATION OF INTRAOPERATIVE CT SCAN;  Surgeon: Clois Fret, MD;  Location: [facility] ORS;  Service: Neurosurgery;  Laterality: N/A;   CERVICAL FUSION     POSTERIOR CERVICAL FUSION/FORAMINOTOMY N/A 08/03/2022   Procedure: POSTERIOR CERVICAL FUSION/FORAMINOTOMY LEVEL 3;  Surgeon: Clois Fret, MD;  Location: [facility] ORS;  Service: Neurosurgery;  Laterality: N/A;  C2-3 PSFD with C2-4 instrumentation    FAMILY HISTORY: Family History  Problem Relation Age of Onset   Hypertension Mother    Heart disease Mother    Cancer Father     ADVANCED DIRECTIVES (Y/N):  N  HEALTH MAINTENANCE: Social History   Tobacco Use   Smoking status: Former   Smokeless tobacco: Never  Advertising account planner   Vaping status: Never Used  Substance Use Topics   Alcohol use: No   Drug use: Never     Colonoscopy:  PAP:  Bone density:  Lipid panel:  Allergies  Allergen Reactions   Penicillins Rash    Current Outpatient Medications  Medication Sig Dispense Refill   acetaminophen  (TYLENOL ) 650 MG CR tablet Take 1,300 mg by mouth every 12 (twelve) hours as needed for pain or fever.     aspirin  EC 81 MG tablet Take 81 mg by mouth daily.     docusate sodium  (COLACE) 100 MG capsule Take 100 mg by mouth daily.     doxazosin  (CARDURA ) 2 MG tablet Take 2 mg by mouth daily.     ergocalciferol  (VITAMIN D2) 1.25 MG (50000 UT) capsule Take 1 capsule (50,000 Units total) by mouth once a week. 12 capsule 1   Ferrous Sulfate (IRON)  325 (65 Fe) MG TABS Take 1 tablet by mouth daily.     finasteride  (PROSCAR ) 5 MG tablet Take 1 tablet (5 mg total) by mouth daily. 90 tablet 3   lidocaine  (LMX) 4 % cream Apply topically 3 (three) times daily as needed (left shoulder pain). 30 g 0   melatonin 3 MG TABS tablet Take 6 mg by mouth at bedtime.     meloxicam (MOBIC) 15 MG tablet Take 15 mg by mouth daily.     methocarbamol  (ROBAXIN ) 500 MG  tablet Take 1 tablet (500 mg total) by mouth every 6 (six) hours as needed for muscle spasms.     omeprazole (PRILOSEC) 20 MG capsule Take 20 mg by mouth daily.     polyethylene glycol (MIRALAX  / GLYCOLAX ) 17 g packet Take 17 g by mouth 2 (two) times daily.     potassium chloride  (KLOR-CON  M) 10 MEQ tablet Take 10 mEq by mouth daily.     pravastatin  (PRAVACHOL ) 40 MG tablet Take 40 mg by mouth daily.     torsemide (DEMADEX) 20 MG tablet Take 40 mg by mouth daily.     No current facility-administered medications for this visit.    OBJECTIVE: Vitals:   10/13/23 0958  BP: 103/66  Pulse: 70  Resp: 16  Temp: (!) 97.3 F (36.3 C)  SpO2: 100%     Body mass index is 20.82 kg/m.    ECOG FS:2 - Symptomatic, <50% confined to bed  General: Well-developed, well-nourished, no acute distress.  Sitting in a wheelchair. Eyes: Pink conjunctiva, anicteric sclera. HEENT: Normocephalic, moist mucous membranes. Lungs: No audible wheezing or coughing. Heart: Regular rate and rhythm. Abdomen: Soft, nontender, no obvious distention. Musculoskeletal: No edema, cyanosis, or clubbing.  Easily palpable cyst on left shoulder. Neuro: Alert, answering all questions appropriately. Cranial nerves grossly intact. Skin: No rashes or petechiae noted. Psych: Normal affect.   LAB RESULTS:  Lab Results  Component Value Date   NA 137 10/09/2023   K 4.1 10/09/2023   CL 101 10/09/2023   CO2 30 10/09/2023   GLUCOSE 92 10/09/2023   BUN 23 10/09/2023   CREATININE 1.13 10/09/2023   CALCIUM 9.0 10/09/2023   PROT 7.1 10/16/2022   ALBUMIN 3.8 10/16/2022   AST 21 10/16/2022   ALT 12 10/16/2022   ALKPHOS 67 10/16/2022   BILITOT 0.9 10/16/2022   GFRNONAA >60 10/09/2023   GFRAA >60 12/18/2019    Lab Results  Component Value Date   WBC 4.8 10/09/2023   NEUTROABS 2.7 10/09/2023   HGB 9.9 (L) 10/09/2023   HCT 30.2 (L) 10/09/2023   MCV 97.4 10/09/2023   PLT 193 10/09/2023   Lab Results  Component Value Date    TOTALPROTELP 6.7 10/09/2023   ALBUMINELP 3.7 10/09/2023   A1GS 0.1 10/09/2023   A2GS 0.5 10/09/2023   BETS 0.8 10/09/2023   GAMS 1.6 10/09/2023   MSPIKE 0.5 (H) 10/09/2023   SPEI Comment 10/09/2023     STUDIES: CT CHEST W CONTRAST Result Date: 10/09/2023 EXAM: CT CHEST WITH CONTRAST 10/09/2023 02:27:36 PM TECHNIQUE: CT of the chest was performed with the administration of intravenous contrast. Multiplanar reformatted images are provided for review. Automated exposure control, iterative reconstruction, and/or weight based adjustment of the mA/kV was utilized to reduce the radiation dose to as low as reasonably achievable. COMPARISON: CT chest dated 06/02/2023. CLINICAL HISTORY: Follow up pulmonary nodules. Lung nodule f/u scan. FINDINGS: MEDIASTINUM: Heart and pericardium are unremarkable. The central airways are clear. LYMPH NODES: No  mediastinal, hilar or axillary lymphadenopathy. LUNGS AND PLEURA: Mild eventration of the left hemidiaphragm. 1.9 x 2.4 cm predominantly solid nodule in the superior segment right lower lobe (image 66), with progressive 17 mm solid component (image 64), suggesting semi invasive adenocarcinoma. Nodule involves the right major fissure. 1.7 x 2.7 cm predominantly solid nodule inferiorly in the right upper lobe (image 95) grossly unchanged, suspicious for semi invasive adenocarcinoma. Additional scattered small bilateral pulmonary nodules, including an irregular 8 mm nodule in the anterior right upper lobe (image 52), similar. Mild centrilobular and paraseptal emphysematous changes, upper lung predominant. No pleural effusion or pneumothorax. SOFT TISSUES/BONES: Degenerative changes of the visualized thoracolumbar spine. No acute abnormality of the soft tissues. UPPER ABDOMEN: Limited images of the upper abdomen demonstrates no acute abnormality. IMPRESSION: 1. 2.4 cm predominantly solid nodule in the superior segment right lower lobe with progressivesolid component,  suggesting semi invasive adenocarcinoma. 2. 2.7 cm predominantly solid nodule inferiorly in the right upper lobe, grossly unchanged, suspicious for semi invasive adenocarcinoma. 3. Discussion at multidisciplinary tumor board is suggested. Consider PET CT for further evaluation, as clinically warranted. Electronically signed by: Pinkie Pebbles MD 10/09/2023 11:52 PM EDT RP Workstation: [id number]   US  LT UPPER EXTREM LTD SOFT TISSUE NON VASCULAR Result Date: 09/18/2023 CLINICAL DATA:  Shoulder mass. EXAM: ULTRASOUND LEFT UPPER EXTREMITY LIMITED TECHNIQUE: Ultrasound examination of the upper extremity soft tissues was performed in the area of clinical concern. COMPARISON:  None Available. FINDINGS: Sonographic evaluation of the anterior left shoulder adjacent to the lateral aspect of the clavicle, corresponding to the area of clinical concern, demonstrates a 3.6 x 3.3 x 1.0 cm circumscribed lesion isoechoic to fat without evidence of internal vascularity. IMPRESSION: Sonographic evaluation of the anterior left shoulder, corresponding to the area of clinical concern, demonstrates a 3.6 x 3.3 x 1.0 cm circumscribed lesion isoechoic to fat without evidence of internal vascularity. 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 Consider further evaluation with MRI as clinically warranted. Electronically Signed   By: Harrietta Sherry M.D.   On: 09/18/2023 11:30   US  Venous Img Lower Bilateral (DVT) Result Date: 09/15/2023 CLINICAL DATA:  Bilateral lower extremity edema EXAM: BILATERAL LOWER EXTREMITY VENOUS DOPPLER ULTRASOUND TECHNIQUE: Gray-scale sonography with graded compression, as well as color Doppler and duplex ultrasound were performed to evaluate the lower extremity deep venous systems from the level of the common femoral vein and including the common femoral, femoral, profunda femoral, popliteal and calf veins including the posterior tibial, peroneal  and gastrocnemius veins when visible. The superficial great saphenous vein was also interrogated. Spectral Doppler was utilized to evaluate flow at rest and with distal augmentation maneuvers in the common femoral, femoral and popliteal veins. COMPARISON:  None Available. FINDINGS: RIGHT LOWER EXTREMITY Common Femoral Vein: No evidence of thrombus. Normal compressibility, respiratory phasicity and response to augmentation. Saphenofemoral Junction: No evidence of thrombus. Normal compressibility and flow on color Doppler imaging. Profunda Femoral Vein: No evidence of thrombus. Normal compressibility and flow on color Doppler imaging. Femoral Vein: No evidence of thrombus. Normal compressibility, respiratory phasicity and response to augmentation. Popliteal Vein: No evidence of thrombus. Normal compressibility, respiratory phasicity and response to augmentation. Calf Veins: No evidence of thrombus. Normal compressibility and flow on color Doppler imaging. Superficial Great Saphenous Vein: No evidence of thrombus. Normal compressibility. Venous Reflux:  None. Other Findings:  None. LEFT LOWER EXTREMITY Common Femoral Vein: No evidence of thrombus. Normal compressibility, respiratory phasicity and response to augmentation.  Saphenofemoral Junction: No evidence of thrombus. Normal compressibility and flow on color Doppler imaging. Profunda Femoral Vein: No evidence of thrombus. Normal compressibility and flow on color Doppler imaging. Femoral Vein: No evidence of thrombus. Normal compressibility, respiratory phasicity and response to augmentation. Popliteal Vein: No evidence of thrombus. Normal compressibility, respiratory phasicity and response to augmentation. Calf Veins: No evidence of thrombus. Normal compressibility and flow on color Doppler imaging. Superficial Great Saphenous Vein: No evidence of thrombus. Normal compressibility. Venous Reflux:  None. Other Findings:  None. IMPRESSION: No evidence of deep venous  thrombosis in either lower extremity. Electronically Signed   By: Wilkie Lent M.D.   On: 09/15/2023 16:29     ASSESSMENT: MGUS, pulmonary nodules.  PLAN:    MGUS: Bone marrow biopsy completed on March 13, 2018 revealed a slightly hypercellular bone marrow with trilineage hematopoiesis.  Patient had 5% plasma cells.  Cytogenetic studies were normal.  Metastatic bone survey on February 02, 2018 revealed widespread osseous lesions of unclear etiology.  Patient's M spike is ranged from 0.5-0.7 since October 2019.  His most recent result remains stable at 0.5.  Immunoglobulins continue to be within normal limits.  Kappa free light chains have also remained mildly elevated ranging from 19.9-29.1 over the same timeframe.  His most recent results were 28.9.  He has no evidence of endorgan damage.  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  Patient does not require additional lab work. Pulmonary nodules: CT scan results from October 09, 2023 reviewed independently and report as above with essentially stable pulmonary nodules in both right upper and right lower lobes.  These have grown slowly throughout the years and are suspicious for a low-grade carcinoma.  Continue to monitor with active surveillance.  Return to clinic in 6 months with repeat imaging and further evaluation.   Osseous lesions: Benign.  Given results of bone marrow biopsy and a normal PSA, this is not myeloma or prostate cancer.  PET scan was negative.  No further intervention is needed. 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 Shoulder cyst/pain: Patient reports he is having surgery in the next several weeks.   Patient expressed understanding and was in agreement with this plan. He also understands that He can call clinic at any time with any questions, concerns, or complaints.    Evalene JINNY Reusing, MD   10/13/2023 10:08 AM

## 2023-10-16 ENCOUNTER — Encounter: Payer: Self-pay | Admitting: Oncology

## 2023-10-16 ENCOUNTER — Ambulatory Visit (INDEPENDENT_AMBULATORY_CARE_PROVIDER_SITE_OTHER): Payer: Self-pay | Admitting: Surgery

## 2023-10-16 ENCOUNTER — Encounter: Payer: Self-pay | Admitting: Surgery

## 2023-10-16 VITALS — BP 93/53 | HR 96 | Temp 98.7°F | Ht 69.0 in | Wt 141.0 lb

## 2023-10-16 DIAGNOSIS — D1722 Benign lipomatous neoplasm of skin and subcutaneous tissue of left arm: Secondary | ICD-10-CM | POA: Diagnosis not present

## 2023-10-16 NOTE — Patient Instructions (Addendum)
 You can take your aspirin  tomorrow 10/17/23, but this will need to be your last dose before your procedure in the office on 10/23/23  Lipoma  A lipoma is a noncancerous (benign) tumor that is made up of fat cells. This is a very common type of soft-tissue growth. Lipomas are usually found under the skin (subcutaneous). They may occur in any tissue of the body that contains fat. Common areas for lipomas to appear include the back, arms, shoulders, buttocks, and thighs. Lipomas grow slowly, and they are usually painless. Most lipomas do not cause problems and do not require treatment. What are the causes? The cause of this condition is not known. What increases the risk? You are more likely to develop this condition if: You are 52-76 years old. You have a family history of lipomas. What are the signs or symptoms? A lipoma usually appears as a small, round bump under the skin. In most cases, the lump will: Feel soft or rubbery. Not cause pain or other symptoms. However, if a lipoma is located in an area where it pushes on nerves, it can become painful or cause other symptoms. How is this diagnosed? A lipoma can usually be diagnosed with a physical exam. You may also have tests to confirm the diagnosis and to rule out other conditions. Tests may include: Imaging tests, such as a CT scan or an MRI. Removal of a tissue sample to be looked at under a microscope (biopsy). How is this treated? Treatment for this condition depends on the size of the lipoma and whether it is causing any symptoms. For small lipomas that are not causing problems, no treatment is needed. If a lipoma is bigger or it causes problems, surgery may be done to remove the lipoma. Lipomas can also be removed to improve appearance. Most often, the procedure is done after applying a medicine that numbs the area (local anesthetic). Liposuction may be done to reduce the size of the lipoma before it is removed through surgery, or it may  be done to remove the lipoma. Lipomas are removed with this method to limit incision size and scarring. A liposuction tube is inserted through a small incision into the lipoma, and the contents of the lipoma are removed through the tube with suction. Follow these instructions at home: Watch your lipoma for any changes. Keep all follow-up visits. This is important. Where to find more information OrthoInfo: orthoinfo.aaos.org Contact a health care provider if: Your lipoma becomes larger or hard. Your lipoma becomes painful, red, or increasingly swollen. These could be signs of infection or a more serious condition. Get help right away if: You develop tingling or numbness in an area near the lipoma. This could indicate that the lipoma is causing nerve damage. Summary A lipoma is a noncancerous tumor that is made up of fat cells. Most lipomas do not cause problems and do not require treatment. If a lipoma is bigger or it causes problems, surgery may be done to remove the lipoma. Contact a health care provider if your lipoma becomes larger or hard, or if it becomes painful, red, or increasingly swollen. These could be signs of infection or a more serious condition. This information is not intended to replace advice given to you by your health care provider. Make sure you discuss any questions you have with your health care provider. Document Revised: 04/09/2021 Document Reviewed: 04/09/2021 Elsevier Patient Education  2024 [employer].

## 2023-10-16 NOTE — Progress Notes (Signed)
 10/16/2023  Reason for Visit: Left shoulder lipoma  Requesting Provider: Rock Pounds, MD  History of Present Illness: Frank Buckley is a 78 y.o. 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  The patient reports having this for about a year.  He feels it has been growing in size over the course of this year and has also become more tender.  Reports tenderness at the location of the lipoma, without radiation or changes to his shoulder range of motion.  Denies any redness of the skin or drainage from the area.    He was admitted on 09/15/23 for right lower extremity swelling and cellulitis.  During his hospital stay, he mentioned the mass in the left shoulder to the hospitalist team and an ultrasound was obtained.  This showed a 3.6 x 3.3 x 1 cm mass in the left shoulder most likely a lipoma.  Past Medical History: Past Medical History:  Diagnosis Date   Acute metabolic encephalopathy 07/10/2022   Borderline diabetic    Cancer (HCC)    Elevated liver enzymes 07/10/2022   GERD (gastroesophageal reflux disease)      Past Surgical History: Past Surgical History:  Procedure Laterality Date   APPLICATION OF INTRAOPERATIVE CT SCAN N/A 08/03/2022   Procedure: APPLICATION OF INTRAOPERATIVE CT SCAN;  Surgeon: Clois Fret, MD;  Location: [facility] ORS;  Service: Neurosurgery;  Laterality: N/A;   CERVICAL FUSION     POSTERIOR CERVICAL FUSION/FORAMINOTOMY N/A 08/03/2022   Procedure: POSTERIOR CERVICAL FUSION/FORAMINOTOMY LEVEL 3;  Surgeon: Clois Fret, MD;  Location: [facility] ORS;  Service: Neurosurgery;  Laterality: N/A;  C2-3 PSFD with C2-4 instrumentation    Home Medications: Prior to Admission medications   Medication Sig Start Date End Date Taking? Authorizing Provider  acetaminophen  (TYLENOL ) 650 MG CR tablet Take 1,300 mg by mouth every 12 (twelve) hours as needed for pain or fever. 09/12/23   [provider]  aspirin  EC 81 MG tablet Take 81 mg by mouth daily. 09/12/23    [provider]  docusate sodium  (COLACE) 100 MG capsule Take 100 mg by mouth daily.    [provider]  doxazosin  (CARDURA ) 2 MG tablet Take 2 mg by mouth daily. 09/12/23   [provider]  ergocalciferol  (VITAMIN D2) 1.25 MG (50000 UT) capsule Take 1 capsule (50,000 Units total) by mouth once a week. 06/25/20   Geofm Delon BRAVO, NP  Ferrous Sulfate (IRON) 325 (65 Fe) MG TABS Take 1 tablet by mouth daily. 10/04/23   [provider]  finasteride  (PROSCAR ) 5 MG tablet Take 1 tablet (5 mg total) by mouth daily. 05/26/21   McGowan, Clotilda LABOR, PA-C  lidocaine  (LMX) 4 % cream Apply topically 3 (three) times daily as needed (left shoulder pain). 09/18/23   Fausto Sor A, DO  melatonin 3 MG TABS tablet Take 6 mg by mouth at bedtime.    [provider]  meloxicam (MOBIC) 15 MG tablet Take 15 mg by mouth daily. 09/12/23   [provider]  methocarbamol  (ROBAXIN ) 500 MG tablet Take 1 tablet (500 mg total) by mouth every 6 (six) hours as needed for muscle spasms. 09/18/23   Fausto Sor LABOR, DO  omeprazole (PRILOSEC) 20 MG capsule Take 20 mg by mouth daily.    [provider]  polyethylene glycol (MIRALAX  / GLYCOLAX ) 17 g packet Take 17 g by mouth 2 (two) times daily. 09/18/23   Fausto Sor A, DO  potassium chloride  (KLOR-CON  M) 10 MEQ tablet Take 10 mEq by mouth  daily. 09/12/23   [provider]  pravastatin  (PRAVACHOL ) 40 MG tablet Take 40 mg by mouth daily. 09/12/23   [provider]  torsemide (DEMADEX) 20 MG tablet Take 40 mg by mouth daily. 09/12/23   [provider]    Allergies: Allergies  Allergen Reactions   Penicillins Rash    Social History:  reports that he has quit smoking. He has never used smokeless tobacco. He reports that he does not drink alcohol and does not use drugs.   Family History: Family History  Problem Relation Age of Onset   Hypertension Mother    Heart disease Mother    Cancer  Father     Review of Systems: Review of Systems  Constitutional:  Negative for chills and fever.  Respiratory:  Negative for shortness of breath.   Cardiovascular:  Negative for chest pain.  Gastrointestinal:  Negative for nausea and vomiting.  Skin:        Left shoulder mass, growing.    Physical Exam BP (!) 93/53   Pulse 96   Temp 98.7 F (37.1 C) (Oral)   Ht 5' 9 (1.753 m)   Wt 141 lb (64 kg)   SpO2 99%   BMI 20.82 kg/m  CONSTITUTIONAL: No acute distress, well nourished. HEENT:  Normocephalic, atraumatic, extraocular motion intact. RESPIRATORY:  Normal respiratory effort without pathologic use of accessory muscles. CARDIOVASCULAR: Regular rhythm and rate. MUSCULOSKELETAL:  Normal muscle strength and tone in all four extremities.  No peripheral edema or cyanosis. SKIN: The patient has a cluster of two adjacent lipomas in the anterior left shoulder, measuring about 2 cm and 1 cm each.  These are mobile, soft, non-tender, without any overlying erythema or induration. NEUROLOGIC:  Motor and sensation is grossly normal.  Cranial nerves are grossly intact. PSYCH:  Alert and oriented to person, place and time. Affect is normal.  Laboratory Analysis: Labs from 10/09/2023: Sodium 137, potassium 4.1, chloride 101, CO2 30, BUN 23, creatinine 1.13.  WBC 4.8, hemoglobin 9.9, hematocrit 30.2, platelets 183.  Imaging: Ultrasound Left Shoulder on 09/18/23: IMPRESSION: Sonographic evaluation of the anterior left shoulder, corresponding to the area of clinical concern, demonstrates a 3.6 x 3.3 x 1.0 cm circumscribed lesion isoechoic to fat without evidence of internal vascularity. 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 Consider further evaluation with MRI as clinically warranted.  Assessment and Plan: This is a 78 y.o. male with a left shoulder lipoma(s).  --Discussed with the patient the findings on exam today.  It appears he  has two adjacent masses in the left shoulder.  These are soft, mobile, non-tender, without any overlying skin changes.  These are most consistent with lipomas.  Discussed with him that given his pain with them and that he feels they are growing, we can offer excision of the lipomas.  Discussed with him that we can do this as an office procedure under local anesthesia.  Discussed with him the planned incision, risks of bleeding, infection, injury to surrounding structures, postoperative pain control, activity restrictions, and he is willing to proceed. - We will schedule him for procedure visit on 10/23/2023.  All of his questions have been answered.  I spent 30 minutes dedicated to the care of this patient on the date of this encounter to include pre-visit review of records, face-to-face time with the patient discussing diagnosis and management, and any post-visit coordination of care.   [name], MD [facility]

## 2023-10-23 ENCOUNTER — Encounter: Payer: Self-pay | Admitting: Surgery

## 2023-10-23 ENCOUNTER — Ambulatory Visit (INDEPENDENT_AMBULATORY_CARE_PROVIDER_SITE_OTHER): Admitting: Surgery

## 2023-10-23 VITALS — BP 113/68 | HR 63 | Ht 69.0 in | Wt 140.0 lb

## 2023-10-23 DIAGNOSIS — D1722 Benign lipomatous neoplasm of skin and subcutaneous tissue of left arm: Secondary | ICD-10-CM

## 2023-10-23 NOTE — Progress Notes (Signed)
  Procedure Date:  10/23/2023  Pre-operative Diagnosis:  Left shoulder lipoma  Post-operative Diagnosis:  Left shoulder lipoma, 3.5 cm.  Procedure:  Excision of Left shoulder lipoma  Surgeon:  [name], MD  Anesthesia:  General endotracheal  Estimated Blood Loss:  2 ml  Specimens:  Left shoulder lipoma  Complications:  None  Indications for Procedure:  This is a 78 y.o. male with diagnosis of a symptomatic left shoulder lipoma.  The patient wishes to have this excised. The risks of bleeding, abscess or infection, injury to surrounding structures, and need for further procedures were all discussed with the patient and he was willing to proceed.  Description of Procedure: The patient was correctly identified at bedside.  The patient was placed supine.  Appropriate time-outs were performed.  The patient's left shoulder was prepped and draped in usual sterile fashion.  Local anesthetic was infused intradermally.  A 3 cm incision was made over the lipoma, and scalpel was used to dissect down the skin and subcutaneous tissue.  Skin flaps were created sharply, and then the lipoma was excised intact.  It was sent off to pathology.  The cavity was then irrigated and hemostasis was assured with manual pressure.  The wound was then closed in three layers using 3-0 Vicryl and 4-0 Monocryl.  The incision was cleaned and sealed with DermaBond.  The patient tolerated the procedure well and all sharps were appropriately disposed of at the end of the case.  --Patient may shower tomorrow --May take Tylenol  or Ibuprofen  for pain control. --May resume your Aspirin  on 10/24/23 --Follow up next week.  [name], MD

## 2023-10-23 NOTE — Patient Instructions (Addendum)
 Do call Neurosurgery for a follow up [phone].   Do take some Tylenol  when you get home as well as your usual medications.  You may restart your Aspirin  tomorrow.  Today we have removed a Lipoma in our office. Please see information below regarding this type of tumor.  You are free to shower Tomorrow. Do not scrub at the area.   You have glue on your skin and sutures under the skin. The glue will come off on it's own in 10-14 days. You may shower normally until this occurs but do not submerge.  Please use Tylenol  or Ibuprofen  for pain as needed. You may use ice to the area 3-4 times today and tomorrow for any achiness.   We will see you back in 7-10 days to ensure that this has healed and to review the final pathology. Please see your appointment below. You may continue your regular activities right away but if you are having pain while doing something, stop what you are doing and try this activity once again in 3 days. Please call our office with any questions or concerns prior to your appointment.  Call to report any excessive bleeding, spreading redness, or increased pain.    Lipoma Removal Lipoma removal is a surgical procedure to remove a noncancerous (benign) tumor that is made up of fat cells (lipoma). Most lipomas are small and painless and do not require treatment. They can form in many areas of the body but are most common under the skin of the back, shoulders, arms, and thighs. You may need lipoma removal if you have a lipoma that is large, growing, or causing discomfort. Lipoma removal may also be done for cosmetic reasons. Tell a health care provider about: Any allergies you have. All medicines you are taking, including vitamins, herbs, eye drops, creams, and over-the-counter medicines. Any problems you or family members have had with anesthetic medicines. Any blood disorders you have. Any surgeries you have had. Any medical conditions you have. Whether you  are pregnant or may be pregnant. What are the risks? Generally, this is a safe procedure. However, problems may occur, including: Infection. Bleeding. Allergic reactions to medicines. Damage to nerves or blood vessels near the lipoma. Scarring.  Medicines Ask your health care provider about: Changing or stopping your regular medicines. This is especially important if you are taking diabetes medicines or blood thinners. Taking medicines such as aspirin  and ibuprofen . These medicines can thin your blood. Do not take these medicines before your procedure if your health care provider instructs you not to. You may be given antibiotic medicine to help prevent infection. General instructions Ask your health care provider how your surgical site will be marked or identified. You will have a physical exam. Your health care provider will check the size of the lipoma and whether it can be moved easily.  What happens during the procedure? To reduce your risk of infection: Your health care team will wash or sanitize their hands. Your skin will be washed with surgical soap. You will be given the following: A medicine to numb the area (local anesthetic). An incision will be made over the lipoma or very near the lipoma. The incision may be made in a natural skin line or crease. Tissues, nerves, and blood vessels near the lipoma will be moved out of the way. The lipoma and the capsule that surrounds it will be separated from the surrounding tissues. The lipoma will be removed. The incision may be closed with stitches and  surgical glue

## 2023-10-25 LAB — SURGICAL PATHOLOGY

## 2023-10-30 ENCOUNTER — Encounter: Payer: Self-pay | Admitting: Surgery

## 2023-10-30 ENCOUNTER — Ambulatory Visit (INDEPENDENT_AMBULATORY_CARE_PROVIDER_SITE_OTHER): Admitting: Surgery

## 2023-10-30 VITALS — BP 100/56 | HR 86 | Ht 69.0 in | Wt 141.0 lb

## 2023-10-30 DIAGNOSIS — D1722 Benign lipomatous neoplasm of skin and subcutaneous tissue of left arm: Secondary | ICD-10-CM

## 2023-10-30 DIAGNOSIS — Z09 Encounter for follow-up examination after completed treatment for conditions other than malignant neoplasm: Secondary | ICD-10-CM

## 2023-10-30 NOTE — Progress Notes (Signed)
 10/30/2023  HPI: Frank Buckley is a 78 y.o. male s/p excision of left shoulder lipoma on 10/23/23.  Patient presents for follow up.  He reports he's doing very well and denies any issues with the left shoulder.  Reports DermaBond is starting to peel off.  Denies any pain or drainage.  Vital signs: BP (!) 100/56   Pulse 86   Ht 5' 9 (1.753 m)   Wt 141 lb (64 kg)   SpO2 99%   BMI 20.82 kg/m    Physical Exam: Constitutional: No acute distress Skin:  Left shoulder incision is healing well and is clean, dry, intact, without evidence of infection or ecchymosis.  DermaBond starting to peel off.  Assessment/Plan: This is a 78 y.o. male s/p excision of left shoulder lipoma  --Patient is healing very well and has no issues post-op. --Discussed pathology results -- lipoma --Discussed activity restrictions for another week --Follow up as needed.   [name], MD [facility]

## 2023-10-30 NOTE — Patient Instructions (Signed)
Please call the office if you have any questions or concerns. 

## 2023-11-21 ENCOUNTER — Ambulatory Visit
Admission: RE | Admit: 2023-11-21 | Discharge: 2023-11-21 | Disposition: A | Discharge status: Home / Self Care | Attending: Family Medicine | Admitting: Family Medicine

## 2023-11-21 ENCOUNTER — Other Ambulatory Visit: Payer: Self-pay | Admitting: Family Medicine

## 2023-11-21 ENCOUNTER — Ambulatory Visit
Admission: RE | Admit: 2023-11-21 | Discharge: 2023-11-21 | Disposition: A | Discharge status: Home / Self Care | Source: Ambulatory Visit | Attending: Family Medicine | Admitting: Family Medicine

## 2023-11-21 DIAGNOSIS — R0602 Shortness of breath: Secondary | ICD-10-CM | POA: Insufficient documentation

## 2024-03-18 ENCOUNTER — Ambulatory Visit: Admitting: Surgery

## 2024-03-18 ENCOUNTER — Encounter: Payer: Self-pay | Admitting: Surgery

## 2024-03-18 VITALS — BP 132/73 | HR 93 | Ht 69.0 in | Wt 150.0 lb

## 2024-03-18 DIAGNOSIS — M25512 Pain in left shoulder: Secondary | ICD-10-CM | POA: Diagnosis not present

## 2024-03-18 DIAGNOSIS — D1722 Benign lipomatous neoplasm of skin and subcutaneous tissue of left arm: Secondary | ICD-10-CM

## 2024-03-18 NOTE — Patient Instructions (Addendum)
 We will send a referral to Orthopedics for them to see you about your shoulder pain. This is going to [facility]. They will call you to schedule this appointment.     Follow-up with our office as needed.  Please call and ask to speak with a nurse if you develop questions or concerns.

## 2024-03-18 NOTE — Progress Notes (Signed)
 03/18/2024  History of Present Illness: Frank Buckley is a 78 y.o. male s/p excision of left anterior shoulder lipoma on 10/23/23.  He presents because over the past 2-3 months he has been experiencing pain in his left shoulder that is impeding some of his daily activities.  He reports pain when trying to raise his arm above his head, and also reports that the pain sometimes gets in the way of his driving and holding the steering wheel.  He was hoping that the pain will improve on its own, but it has not.  Past Medical History: Past Medical History:  Diagnosis Date   Acute metabolic encephalopathy 07/10/2022   Borderline diabetic    Cancer (HCC)    Elevated liver enzymes 07/10/2022   GERD (gastroesophageal reflux disease)      Past Surgical History: Past Surgical History:  Procedure Laterality Date   APPLICATION OF INTRAOPERATIVE CT SCAN N/A 08/03/2022   Procedure: APPLICATION OF INTRAOPERATIVE CT SCAN;  Surgeon: Clois Fret, MD;  Location: [facility] ORS;  Service: Neurosurgery;  Laterality: N/A;   CERVICAL FUSION     POSTERIOR CERVICAL FUSION/FORAMINOTOMY N/A 08/03/2022   Procedure: POSTERIOR CERVICAL FUSION/FORAMINOTOMY LEVEL 3;  Surgeon: Clois Fret, MD;  Location: [facility] ORS;  Service: Neurosurgery;  Laterality: N/A;  C2-3 PSFD with C2-4 instrumentation    Home Medications: Prior to Admission medications  Medication Sig Start Date End Date Taking? Authorizing Provider  acetaminophen  (TYLENOL ) 650 MG CR tablet Take 1,300 mg by mouth every 12 (twelve) hours as needed for pain or fever. 09/12/23  Yes [provider]  aspirin  EC 81 MG tablet Take 81 mg by mouth daily. 09/12/23  Yes [provider]  docusate sodium  (COLACE) 100 MG capsule Take 100 mg by mouth daily.   Yes [provider]  doxazosin  (CARDURA ) 2 MG tablet Take 2 mg by mouth daily. 09/12/23  Yes [provider]  ergocalciferol  (VITAMIN D2) 1.25 MG (50000 UT) capsule Take 1 capsule  (50,000 Units total) by mouth once a week. 06/25/20  Yes Geofm Delon BRAVO, NP  Ferrous Sulfate (IRON) 325 (65 Fe) MG TABS Take 1 tablet by mouth daily. 10/04/23  Yes [provider]  finasteride  (PROSCAR ) 5 MG tablet Take 1 tablet (5 mg total) by mouth daily. 05/26/21  Yes McGowan, Clotilda A, PA-C  lactulose (CHRONULAC) 10 GM/15ML solution Take 10 g by mouth daily as needed. 01/08/24  Yes [provider]  lidocaine  (LMX) 4 % cream Apply topically 3 (three) times daily as needed (left shoulder pain). 09/18/23  Yes Fausto Sor A, DO  melatonin 3 MG TABS tablet Take 6 mg by mouth at bedtime.   Yes [provider]  meloxicam (MOBIC) 15 MG tablet Take 15 mg by mouth daily. 09/12/23  Yes [provider]  methocarbamol  (ROBAXIN ) 500 MG tablet Take 1 tablet (500 mg total) by mouth every 6 (six) hours as needed for muscle spasms. 09/18/23  Yes Fausto Sor A, DO  omeprazole (PRILOSEC) 20 MG capsule Take 20 mg by mouth daily.   Yes [provider]  polyethylene glycol (MIRALAX  / GLYCOLAX ) 17 g packet Take 17 g by mouth 2 (two) times daily. 09/18/23  Yes Fausto Sor A, DO  potassium chloride  (KLOR-CON  M) 10 MEQ tablet Take 10 mEq by mouth daily. 09/12/23  Yes [provider]  pravastatin  (PRAVACHOL ) 40 MG tablet Take 40 mg by mouth daily. 09/12/23  Yes [provider]  torsemide (DEMADEX) 20 MG tablet Take 40 mg by mouth  daily. 09/12/23  Yes [provider]    Allergies: Allergies[1]  Review of Systems: Review of Systems  Constitutional:  Negative for chills and fever.  Respiratory:  Negative for shortness of breath.   Cardiovascular:  Negative for chest pain.  Gastrointestinal:  Negative for nausea and vomiting.  Musculoskeletal:  Positive for joint pain (left shoulder pain).    Physical Exam BP 132/73   Pulse 93   Ht 5' 9 (1.753 m)   Wt 150 lb (68 kg)   SpO2 98%   BMI 22.15 kg/m  CONSTITUTIONAL: No acute  distress HEENT:  Normocephalic, atraumatic, extraocular motion intact. RESPIRATORY:  Normal respiratory effort without pathologic use of accessory muscles. CARDIOVASCULAR: Regular rhythm and rate MUSCULOSKELETAL:  There is stiffness of the left shoulder joint.  Unable to elevate left arm above head without pain.  The left anterior shoulder incision is well healed, without any complicated scarring changes, seromas, or contractures. NEUROLOGIC:  Motor and sensation is grossly normal.  Cranial nerves are grossly intact. PSYCH:  Alert and oriented to person, place and time. Affect is normal.   Assessment and Plan: This is a 78 y.o. male with left shoulder pain.  --Discussed with the patient that the incision from the lipoma excision is well healed and there are no complications related to the incision.  His joint pain seems to be musculoskeletal in origin and he has quite limited range of motion due to pain.   --Will send referral to orthopedic surgery for evaluation of his left shoulder.   --Follow up as needed.  I spent 20 minutes dedicated to the care of this patient on the date of this encounter to include pre-visit review of records, face-to-face time with the patient discussing diagnosis and management, and any post-visit coordination of care.   Frank Sheree Plant, MD  [facility]        [1]  Allergies Allergen Reactions   Penicillins Rash

## 2024-04-15 ENCOUNTER — Telehealth: Payer: Self-pay | Admitting: Oncology

## 2024-04-15 ENCOUNTER — Encounter: Payer: Self-pay | Admitting: Oncology

## 2024-04-15 ENCOUNTER — Ambulatory Visit

## 2024-04-15 NOTE — Telephone Encounter (Signed)
 Called pt to r/s CT - pt confirmed date/time/location - [facility]

## 2024-04-23 ENCOUNTER — Ambulatory Visit
Admission: RE | Admit: 2024-04-23 | Discharge: 2024-04-23 | Disposition: A | Discharge status: Home / Self Care | Source: Ambulatory Visit | Attending: Oncology | Admitting: Oncology

## 2024-04-23 ENCOUNTER — Ambulatory Visit: Payer: Medicare HMO | Admitting: Oncology

## 2024-04-23 ENCOUNTER — Other Ambulatory Visit: Payer: Medicare HMO

## 2024-04-23 DIAGNOSIS — R918 Other nonspecific abnormal finding of lung field: Secondary | ICD-10-CM | POA: Insufficient documentation

## 2024-04-23 MED ORDER — IOHEXOL 300 MG/ML  SOLN
75.0000 mL | Freq: Once | INTRAMUSCULAR | Status: AC | PRN
  Administered 2024-04-23: 75 mL via INTRAVENOUS

## 2024-04-30 ENCOUNTER — Encounter: Payer: Self-pay | Admitting: Oncology

## 2024-04-30 ENCOUNTER — Inpatient Hospital Stay: Payer: Medicare HMO | Admitting: Oncology

## 2024-05-01 ENCOUNTER — Telehealth: Payer: Self-pay | Admitting: Oncology

## 2024-05-01 NOTE — Telephone Encounter (Signed)
 Benett called and left a message with out answering service that he had overlooked his appt on 04/30/24 with Dr Jacobo. Please call to reschedule.

## 2024-05-01 NOTE — Telephone Encounter (Signed)
 Called pt to r/s missed appt - [facility]

## 2024-05-10 ENCOUNTER — Inpatient Hospital Stay: Admitting: Oncology

## 2024-05-10 ENCOUNTER — Encounter: Payer: Self-pay | Admitting: Oncology

## 2024-05-10 DIAGNOSIS — R918 Other nonspecific abnormal finding of lung field: Secondary | ICD-10-CM

## 2024-05-10 NOTE — Progress Notes (Unsigned)
 " [facility]  [phone] [phone]) [phone]  ID: Frank Buckley OB: [birth year]  MR#: [id number]  RDW#:[id number]  Patient Care Team: [place] HERO, MD as PCP - General (Family Medicine) Jacobo Evalene PARAS, MD as Consulting Physician (Oncology)  CHIEF COMPLAINT: MGUS, pulmonary nodules.  INTERVAL HISTORY: Patient returns to clinic today for further evaluation and discussion of his imaging and laboratory results.  His performance status has declined somewhat, patient otherwise feels well.  He has left shoulder pain and is having surgery to remove a cyst in the next several weeks.  He has no neurologic complaints. He denies any recent fevers or illnesses.  He denies any chest pain, shortness of breath, cough, or hemoptysis.  He denies any nausea, vomiting, constipation, or diarrhea.  He has no urinary complaints.  Patient offers no further specific complaints today.  REVIEW OF SYSTEMS:   Review of Systems  Constitutional:  Positive for malaise/fatigue. Negative for fever and weight loss.  Respiratory: Negative.  Negative for cough, hemoptysis and shortness of breath.   Cardiovascular: Negative.  Negative for chest pain and leg swelling.  Gastrointestinal: Negative.  Negative for abdominal pain.  Genitourinary: Negative.  Negative for hematuria.  Musculoskeletal:  Positive for joint pain. Negative for back pain.  Skin: Negative.  Negative for rash.  Neurological: Negative.  Negative for sensory change, focal weakness, weakness and headaches.  Psychiatric/Behavioral: Negative.  The patient is not nervous/anxious.     As per HPI. Otherwise, a complete review of systems is negative.  PAST MEDICAL HISTORY: Past Medical History:  Diagnosis Date   Acute metabolic encephalopathy 07/10/2022   Borderline diabetic    Cancer (HCC)    Elevated liver enzymes 07/10/2022   GERD (gastroesophageal reflux disease)     PAST SURGICAL HISTORY: Past Surgical  History:  Procedure Laterality Date   APPLICATION OF INTRAOPERATIVE CT SCAN N/A 08/03/2022   Procedure: APPLICATION OF INTRAOPERATIVE CT SCAN;  Surgeon: Clois Fret, MD;  Location: [facility] ORS;  Service: Neurosurgery;  Laterality: N/A;   CERVICAL FUSION     POSTERIOR CERVICAL FUSION/FORAMINOTOMY N/A 08/03/2022   Procedure: POSTERIOR CERVICAL FUSION/FORAMINOTOMY LEVEL 3;  Surgeon: Clois Fret, MD;  Location: [facility] ORS;  Service: Neurosurgery;  Laterality: N/A;  C2-3 PSFD with C2-4 instrumentation    FAMILY HISTORY: Family History  Problem Relation Age of Onset   Hypertension Mother    Heart disease Mother    Cancer Father     ADVANCED DIRECTIVES (Y/N):  N  HEALTH MAINTENANCE: Social History   Tobacco Use   Smoking status: Former    Passive exposure: Past   Smokeless tobacco: Never  Vaping Use   Vaping status: Never Used  Substance Use Topics   Alcohol use: No   Drug use: Never     Colonoscopy:  PAP:  Bone density:  Lipid panel:  Allergies  Allergen Reactions   Penicillins Rash    Current Outpatient Medications  Medication Sig Dispense Refill   acetaminophen  (TYLENOL ) 650 MG CR tablet Take 1,300 mg by mouth every 12 (twelve) hours as needed for pain or fever.     aspirin  EC 81 MG tablet Take 81 mg by mouth daily.     docusate sodium  (COLACE) 100 MG capsule Take 100 mg by mouth daily.     doxazosin  (CARDURA ) 2 MG tablet Take 2 mg by mouth daily.     ergocalciferol  (VITAMIN D2) 1.25 MG (50000 UT) capsule Take 1 capsule (50,000 Units total) by mouth once a week. 12  capsule 1   Ferrous Sulfate (IRON) 325 (65 Fe) MG TABS Take 1 tablet by mouth daily.     finasteride  (PROSCAR ) 5 MG tablet Take 1 tablet (5 mg total) by mouth daily. 90 tablet 3   lactulose (CHRONULAC) 10 GM/15ML solution Take 10 g by mouth daily as needed.     lidocaine  (LMX) 4 % cream Apply topically 3 (three) times daily as needed (left shoulder pain). 30 g 0   melatonin 3 MG  TABS tablet Take 6 mg by mouth at bedtime.     meloxicam (MOBIC) 15 MG tablet Take 15 mg by mouth daily.     methocarbamol  (ROBAXIN ) 500 MG tablet Take 1 tablet (500 mg total) by mouth every 6 (six) hours as needed for muscle spasms.     omeprazole (PRILOSEC) 20 MG capsule Take 20 mg by mouth daily.     polyethylene glycol (MIRALAX  / GLYCOLAX ) 17 g packet Take 17 g by mouth 2 (two) times daily.     potassium chloride  (KLOR-CON  M) 10 MEQ tablet Take 10 mEq by mouth daily.     pravastatin  (PRAVACHOL ) 40 MG tablet Take 40 mg by mouth daily.     torsemide (DEMADEX) 20 MG tablet Take 40 mg by mouth daily.     No current facility-administered medications for this visit.    OBJECTIVE: Vitals:   05/10/24 1111  BP: (!) 103/58  Pulse: 67  Temp: (!) 96.9 F (36.1 C)  SpO2: 100%     Body mass index is 21.56 kg/m.    ECOG FS:2 - Symptomatic, <50% confined to bed  General: Well-developed, well-nourished, no acute distress.  Sitting in a wheelchair. Eyes: Pink conjunctiva, anicteric sclera. HEENT: Normocephalic, moist mucous membranes. Lungs: No audible wheezing or coughing. Heart: Regular rate and rhythm. Abdomen: Soft, nontender, no obvious distention. Musculoskeletal: No edema, cyanosis, or clubbing.  Easily palpable cyst on left shoulder. Neuro: Alert, answering all questions appropriately. Cranial nerves grossly intact. Skin: No rashes or petechiae noted. Psych: Normal affect.   LAB RESULTS:  Lab Results  Component Value Date   NA 137 10/09/2023   K 4.1 10/09/2023   CL 101 10/09/2023   CO2 30 10/09/2023   GLUCOSE 92 10/09/2023   BUN 23 10/09/2023   CREATININE 1.13 10/09/2023   CALCIUM 9.0 10/09/2023   PROT 7.1 10/16/2022   ALBUMIN 3.8 10/16/2022   AST 21 10/16/2022   ALT 12 10/16/2022   ALKPHOS 67 10/16/2022   BILITOT 0.9 10/16/2022   GFRNONAA >60 10/09/2023   GFRAA >60 12/18/2019    Lab Results  Component Value Date   WBC 4.8 10/09/2023   NEUTROABS 2.7  10/09/2023   HGB 9.9 (L) 10/09/2023   HCT 30.2 (L) 10/09/2023   MCV 97.4 10/09/2023   PLT 193 10/09/2023   Lab Results  Component Value Date   TOTALPROTELP 6.7 10/09/2023   ALBUMINELP 3.7 10/09/2023   A1GS 0.1 10/09/2023   A2GS 0.5 10/09/2023   BETS 0.8 10/09/2023   GAMS 1.6 10/09/2023   MSPIKE 0.5 (H) 10/09/2023   SPEI Comment 10/09/2023     STUDIES: CT CHEST W CONTRAST Result Date: 04/23/2024 CLINICAL DATA:  Follow-up of lung nodules. EXAM: CT CHEST WITH CONTRAST TECHNIQUE: Multidetector CT imaging of the chest was performed during intravenous contrast administration. RADIATION DOSE REDUCTION: This exam was performed according to the departmental dose-optimization program which includes automated exposure control, adjustment of the mA and/or kV according to patient size and/or use of iterative reconstruction technique. CONTRAST:  75mL OMNIPAQUE  IOHEXOL  300 MG/ML  SOLN COMPARISON:  CT scan chest from 10/09/2023. FINDINGS: Cardiovascular: Normal cardiac size. No pericardial effusion. No aortic aneurysm. There are coronary artery calcifications, in keeping with coronary artery disease. There are also mild peripheral atherosclerotic vascular calcifications of thoracic aorta and its major branches. Mediastinum/Nodes: Visualized thyroid gland appears grossly unremarkable. No solid / cystic mediastinal masses. The esophagus is nondistended precluding optimal assessment. No axillary, mediastinal or hilar lymphadenopathy by size criteria. Lungs/Pleura: The central tracheo-bronchial tree is patent. There are several focal lung nodules, which are compared with the prior exam from 10/09/2023 and described as follows: *Part solid nodule in the superior segment of right lower lobe abutting the right major fissure measuring 1.6 x 2.3 cm, essentially similar to the prior study, when remeasured in similar fashion. *There is another heterogeneous predominantly solid nodule in the anteroinferior aspect of right  upper lobe measuring 1.4 x 2.6 cm (series 4, image 84), essentially similar to the prior study, when remeasured in similar fashion. *There are multiple additional, stable, sub 4 mm calcified and noncalcified nodules throughout bilateral lungs (marked with electronic arrow sign on series 2004). No new or suspicious lung nodule. No mass or consolidation. No pleural effusion or pneumothorax. Upper Abdomen: There is a heterogeneous subcapsular 2.4 x 3.5 cm lesion in the right hepatic dome, segment 7, favored to represent a hemangioma. Remaining visualized upper abdominal viscera within normal limits. Musculoskeletal: The visualized soft tissues of the chest wall are grossly unremarkable. No suspicious osseous lesions. There are mild to moderate multilevel degenerative changes in the visualized spine. IMPRESSION: 1. Essentially stable exam. There are 2 right lung nodules, as described above. No new suspicious lung nodule. 2. 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 3. Otherwise essentially unremarkable exam, as described above. Aortic Atherosclerosis (ICD10-I70.0). Electronically Signed   By: Ree Molt M.D.   On: 04/23/2024 14:58     ASSESSMENT: MGUS, pulmonary nodules.  PLAN:    MGUS: Bone marrow biopsy completed on March 13, 2018 revealed a slightly hypercellular bone marrow with trilineage hematopoiesis.  Patient had 5% plasma cells.  Cytogenetic studies were normal.  Metastatic bone survey on February 02, 2018 revealed widespread osseous lesions of unclear etiology.  Patient's M spike is ranged from 0.5-0.7 since October 2019.  His most recent result remains stable at 0.5.  Immunoglobulins continue to be within normal limits.  Kappa free light chains have also remained mildly elevated ranging from 19.9-29.1 over the same timeframe.  His most recent results were 28.9.  He has no evidence of endorgan damage.  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  Patient does not require additional lab work. Pulmonary nodules: CT scan results from October 09, 2023 reviewed independently and report as above with essentially stable pulmonary nodules in both right upper and right lower lobes.  These have grown slowly throughout the years and are suspicious for a low-grade carcinoma.  Continue to monitor with active surveillance.  Return to clinic in 6 months with repeat imaging and further evaluation.   Osseous lesions: Benign.  Given results of bone marrow biopsy and a normal PSA, this is not myeloma or prostate cancer.  PET scan was negative.  No further intervention is needed. 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 Shoulder cyst/pain: Patient reports he is having surgery in the next several weeks.   Patient expressed understanding and was in agreement with this plan.  He also understands that He can call clinic at any time with any questions, concerns, or complaints.    Evalene JINNY Reusing, MD   05/10/2024 12:29 PM     "

## 2024-05-10 NOTE — Progress Notes (Unsigned)
 [place] complaints of rib/side pain. Wants to discuss chest CT 04/23/24.

## 2024-11-08 ENCOUNTER — Other Ambulatory Visit

## 2024-11-15 ENCOUNTER — Inpatient Hospital Stay: Admitting: Oncology
